# Patient Record
Sex: Female | Born: 1955 | Race: White | Hispanic: No | Smoking: Never smoker
Health system: Southern US, Community
[De-identification: ages and names within clinical notes are randomized; demographics above are authoritative.]

## PROBLEM LIST (undated history)

## (undated) DIAGNOSIS — E119 Type 2 diabetes mellitus without complications: Secondary | ICD-10-CM

## (undated) DIAGNOSIS — I10 Essential (primary) hypertension: Secondary | ICD-10-CM

## (undated) DIAGNOSIS — G629 Polyneuropathy, unspecified: Secondary | ICD-10-CM

## (undated) DIAGNOSIS — T8859XA Other complications of anesthesia, initial encounter: Secondary | ICD-10-CM

## (undated) DIAGNOSIS — M1990 Unspecified osteoarthritis, unspecified site: Secondary | ICD-10-CM

## (undated) HISTORY — PX: INNER EAR SURGERY: SHX679

## (undated) HISTORY — PX: APPENDECTOMY: SHX54

## (undated) HISTORY — PX: CATARACT EXTRACTION W/ INTRAOCULAR LENS IMPLANT: SHX1309

## (undated) HISTORY — PX: TONSILLECTOMY: SUR1361

## (undated) HISTORY — PX: EYE SURGERY: SHX253

---

## 2021-07-25 ENCOUNTER — Encounter (HOSPITAL_BASED_OUTPATIENT_CLINIC_OR_DEPARTMENT_OTHER): Payer: Self-pay | Admitting: Emergency Medicine

## 2021-07-25 ENCOUNTER — Other Ambulatory Visit: Payer: Self-pay

## 2021-07-25 ENCOUNTER — Emergency Department (HOSPITAL_BASED_OUTPATIENT_CLINIC_OR_DEPARTMENT_OTHER): Payer: Medicare Other

## 2021-07-25 ENCOUNTER — Emergency Department (HOSPITAL_BASED_OUTPATIENT_CLINIC_OR_DEPARTMENT_OTHER)
Admission: EM | Admit: 2021-07-25 | Discharge: 2021-07-25 | Disposition: A | Discharge status: Home / Self Care | Payer: Medicare Other | Attending: Emergency Medicine | Admitting: Emergency Medicine

## 2021-07-25 DIAGNOSIS — S42352A Displaced comminuted fracture of shaft of humerus, left arm, initial encounter for closed fracture: Secondary | ICD-10-CM

## 2021-07-25 DIAGNOSIS — W19XXXA Unspecified fall, initial encounter: Secondary | ICD-10-CM

## 2021-07-25 DIAGNOSIS — E119 Type 2 diabetes mellitus without complications: Secondary | ICD-10-CM | POA: Diagnosis not present

## 2021-07-25 DIAGNOSIS — M7989 Other specified soft tissue disorders: Secondary | ICD-10-CM | POA: Insufficient documentation

## 2021-07-25 DIAGNOSIS — W010XXA Fall on same level from slipping, tripping and stumbling without subsequent striking against object, initial encounter: Secondary | ICD-10-CM | POA: Diagnosis not present

## 2021-07-25 DIAGNOSIS — S4992XA Unspecified injury of left shoulder and upper arm, initial encounter: Secondary | ICD-10-CM | POA: Diagnosis present

## 2021-07-25 DIAGNOSIS — I10 Essential (primary) hypertension: Secondary | ICD-10-CM | POA: Diagnosis not present

## 2021-07-25 HISTORY — DX: Type 2 diabetes mellitus without complications: E11.9

## 2021-07-25 HISTORY — DX: Essential (primary) hypertension: I10

## 2021-07-25 MED ORDER — OXYCODONE-ACETAMINOPHEN 5-325 MG PO TABS
1.0000 | ORAL_TABLET | Freq: Once | ORAL | Status: AC
  Administered 2021-07-25: 1 via ORAL
  Filled 2021-07-25: qty 1

## 2021-07-25 MED ORDER — METHOCARBAMOL 500 MG PO TABS: 500.0000 mg | ORAL_TABLET | Freq: Two times a day (BID) | ORAL | 0 refills | Status: DC

## 2021-07-25 MED ORDER — OXYCODONE-ACETAMINOPHEN 5-325 MG PO TABS: 1.0000 | ORAL_TABLET | Freq: Four times a day (QID) | ORAL | 0 refills | Status: DC | PRN

## 2021-07-25 MED ORDER — HYDROMORPHONE HCL 1 MG/ML IJ SOLN
1.0000 mg | Freq: Once | INTRAMUSCULAR | Status: AC
  Administered 2021-07-25: 1 mg via INTRAVENOUS
  Filled 2021-07-25: qty 1

## 2021-07-25 NOTE — Discharge Instructions (Addendum)
You were evaluated emergency department today with left arm pain after a fall.  Your x-ray of your left upper arm showed that you fractured your humerus at approximately the midshaft of the bone.  We have splinted this and given you pain medication here.    I am also prescribing you a course of pain medication to take called Percocet, which is a combination of oxycodone and acetaminophen. You can take this every 6 hours as needed for severe pain. Do not drink alcohol while taking this medication. Do not take additional acetaminophen/tylenol while taking this medication.   I am also prescribing you robaxin which is a muscle relaxer which you can take twice daily to help with muscle spasms.   Normally the treatment of this is immobilization, pain control, and following up with orthopedic provider.  I have attached Dr. Steward Drone contact information who will see you in clinic next week.

## 2021-07-25 NOTE — ED Notes (Signed)
EMT at bedside for splint application.

## 2021-07-25 NOTE — ED Notes (Signed)
ED Provider at bedside. 

## 2021-07-25 NOTE — ED Triage Notes (Signed)
Pt fell this am.  Pt c/o pain in left arm. No head injury, no LOC

## 2021-07-25 NOTE — ED Notes (Signed)
Dr. Pfeiffer at bedside   

## 2021-07-25 NOTE — Progress Notes (Addendum)
I was contacted regarding patient's left humerus fracture.  I have reviewed x-rays.  Per the emergency department physician I had spoken with, the patient's radial nerve is intact. I discussed patient's situation with Dr. Steward Drone. He has recommended that the patient be placed into a sugar tong splint, with subsequent follow-up with him.  He did also ask that the emergency room MD specifically document that the patient has an intact radial nerve, which is what was verbally stated to me. This request was made to Dr. Donnald Garre, whom I spoke with.

## 2021-07-25 NOTE — ED Provider Notes (Signed)
[facility] EMERGENCY DEPARTMENT Provider Note   CSN: [id number] Arrival date & time: 07/25/21  1056     History Chief Complaint  Patient presents with   Marletta Lor    Caitlin Odonnell is a 65 y.o. female presents with left arm pain after a fall at about 10:00 this morning.  Patient states that she was walking outside, believes that she tripped and fell onto her left side.  Since then she has had persistent pain and swelling in her left upper arm.  Believes that she skinned her knee, no pain anywhere else.  No head trauma or loss of consciousness.   Fall      Past Medical History:  Diagnosis Date   Diabetes mellitus without complication (HCC)    Hypertension     There are no problems to display for this patient.   Past Surgical History:  Procedure Laterality Date   CATARACT EXTRACTION W/ INTRAOCULAR LENS IMPLANT       OB History   No obstetric history on file.     History reviewed. No pertinent family history.     Home Medications Prior to Admission medications   Medication Sig Start Date End Date Taking? Authorizing Provider  oxyCODONE-acetaminophen (PERCOCET/ROXICET) 5-325 MG tablet Take 1 tablet by mouth every 6 (six) hours as needed for severe pain. 07/25/21  Yes Aishah Teffeteller T, PA-C    Allergies    Cortisone  Review of Systems   Review of Systems  Musculoskeletal:        Left upper arm pain  Skin:  Negative for wound.  All other systems reviewed and are negative.  Physical Exam Updated Vital Signs BP 138/71 (BP Location: Right Arm)   Pulse 88   Temp 98.1 F (36.7 C) (Oral)   Resp (!) 26   Ht 5\' 3"  (1.6 m)   Wt 136.1 kg   SpO2 93%   BMI 53.14 kg/m   Physical Exam Vitals and nursing note reviewed.  Constitutional:      Appearance: Normal appearance.  HENT:     Head: Normocephalic and atraumatic.  Eyes:     Conjunctiva/sclera: Conjunctivae normal.  Pulmonary:     Effort: Pulmonary effort is normal. No respiratory distress.   Musculoskeletal:     Comments: Arm held in position of comfort. Tenderness to palpation and noticeable swelling of left upper arm at approximately mid humerus.  No tenderness or deformities palpated of the clavicle, left shoulder, or left lower arm.  Full ROM and 5/5 strength of all left digits including thumb. Good grip strength. Pulses normal and sensation intact in bilateral upper extremities.   Skin:    General: Skin is warm and dry.  Neurological:     Mental Status: She is alert.  Psychiatric:        Mood and Affect: Mood normal.        Behavior: Behavior normal.    ED Results / Procedures / Treatments   Labs (all labs ordered are listed, but only abnormal results are displayed) Labs Reviewed - No data to display  EKG None  Radiology DG Humerus Left  Result Date: 07/25/2021 CLINICAL DATA:  Fall this morning.  Left arm pain. EXAM: LEFT HUMERUS - 2 VIEW COMPARISON:  None. FINDINGS: Comminuted, angulated and mildly displaced midshaft humeral fracture. Fracture is spiral in orientation extending from the posterolateral proximal shaft to the lateral mid to lower shaft. Comminuted butterfly fragment is displaced laterally by 1 cm. Primary fracture components are angulated, distal fracture component  angulated anteriorly by approximately 24 degrees. There is surrounding soft tissue swelling. Elbow and glenohumeral joints appear normally aligned. IMPRESSION: 1. Mildly comminuted, displaced and angulated diaphyseal fracture of the left humerus. Electronically Signed   By: Amie Portland M.D.   On: 07/25/2021 11:55    Procedures Procedures   Medications Ordered in ED Medications  oxyCODONE-acetaminophen (PERCOCET/ROXICET) 5-325 MG per tablet 1 tablet (1 tablet Oral Given 07/25/21 1228)  HYDROmorphone (DILAUDID) injection 1 mg (1 mg Intravenous Given 07/25/21 1342)    ED Course  I have reviewed the triage vital signs and the nursing notes.  Pertinent labs & imaging results that were  available during my care of the patient were reviewed by me and considered in my medical decision making (see chart for details).    MDM Rules/Calculators/A&P                           Patient is a 65 year old female who presents with left upper arm pain after a fall this morning.  No head injury or loss of consciousness.  She does have tenderness palpation and swelling of the left upper arm at approximately the mid humerus.  Pulses normal and sensation intact in bilateral upper extremities. Good grip strength. Full ROM and 5/5 strength to all left digits including thumb. No concern for radial nerve injury.   X-ray of left humerus shows closed mildly comminuted, displaced and angulated diaphyseal fracture of left humerus. Consulted Dr. Steward Drone with orthopedic surgery to discuss management, who recommended sugar tong splint and follow up in his clinic. Plan to d/c to home with splint, pain medication, and ortho follow up.   Patient discussed and seen in conjunction with attending physician Dr Donnald Garre who agrees with above plan.   Final Clinical Impression(s) / ED Diagnoses Final diagnoses:  Closed displaced comminuted fracture of shaft of left humerus, initial encounter  Fall, initial encounter    Rx / DC Orders ED Discharge Orders          Ordered    oxyCODONE-acetaminophen (PERCOCET/ROXICET) 5-325 MG tablet  Every 6 hours PRN        07/25/21 1410             Tamiki Kuba T, PA-C 07/25/21 1506    Arby Barrette, MD 07/26/21 [phone]

## 2021-07-27 ENCOUNTER — Other Ambulatory Visit (HOSPITAL_BASED_OUTPATIENT_CLINIC_OR_DEPARTMENT_OTHER): Payer: Self-pay

## 2021-07-27 ENCOUNTER — Other Ambulatory Visit: Payer: Self-pay

## 2021-07-27 ENCOUNTER — Ambulatory Visit (HOSPITAL_BASED_OUTPATIENT_CLINIC_OR_DEPARTMENT_OTHER): Payer: Medicare Other | Admitting: Orthopaedic Surgery

## 2021-07-27 VITALS — BP 139/83 | Ht 62.0 in | Wt 300.0 lb

## 2021-07-27 DIAGNOSIS — S42352A Displaced comminuted fracture of shaft of humerus, left arm, initial encounter for closed fracture: Secondary | ICD-10-CM | POA: Diagnosis not present

## 2021-07-27 MED ORDER — OXYCODONE HCL 5 MG PO TABS
5.0000 mg | ORAL_TABLET | ORAL | 0 refills | Status: DC | PRN
  Filled 2021-07-27: qty 20, 4d supply, fill #0

## 2021-07-27 MED ORDER — METHOCARBAMOL 500 MG PO TABS
500.0000 mg | ORAL_TABLET | Freq: Four times a day (QID) | ORAL | 2 refills | Status: DC
  Filled 2021-07-27 (×2): qty 30, 8d supply, fill #0

## 2021-07-27 NOTE — Progress Notes (Signed)
Chief Complaint: Left arm pain     History of Present Illness:   Pain Score: 5/10 SANE: 0/100  Caitlin Odonnell is a 65 y.o. 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  She states that she felt a break and subsequently proceeded to the emergency room.  She denies any history of shoulder or arm pain.  The arm was functioning normal prior to this.  She was placed in a sugar-tong splint in the emergency room and given a sling.  She denies any numbness or weakness in the left hand.  She does endorse some swelling and pain about the left shoulder.  She is sleeping in her recliner to get comfortable   Surgical History:   None  PMH/PSH/Family History/Social History/Meds/Allergies:    Past Medical History:  Diagnosis Date   Diabetes mellitus without complication (HCC)    Hypertension    Past Surgical History:  Procedure Laterality Date   CATARACT EXTRACTION W/ INTRAOCULAR LENS IMPLANT     Social History   Socioeconomic History   Marital status: Widowed    Spouse name: Not on file   Number of children: Not on file   Years of education: Not on file   Highest education level: Not on file  Occupational History   Not on file  Tobacco Use   Smoking status: Not on file   Smokeless tobacco: Not on file  Substance and Sexual Activity   Alcohol use: Not on file   Drug use: Not on file   Sexual activity: Not on file  Other Topics Concern   Not on file  Social History Narrative   Not on file   Social Determinants of Health   Financial Resource Strain: Not on file  Food Insecurity: Not on file  Transportation Needs: Not on file  Physical Activity: Not on file  Stress: Not on file  Social Connections: Not on file   No family history on file. Allergies  Allergen Reactions   Cortisone Other (See Comments)    Had some numbness in her face.  States it happened after a cortisone injection in her knee, had never had reaction to it prior to this incident.    Current Outpatient Medications  Medication Sig Dispense Refill   methocarbamol (ROBAXIN) 500 MG tablet Take 1 tablet (500 mg total) by mouth 2 (two) times daily. 20 tablet 0   oxyCODONE-acetaminophen (PERCOCET/ROXICET) 5-325 MG tablet Take 1 tablet by mouth every 6 (six) hours as needed for severe pain. 15 tablet 0   No current facility-administered medications for this visit.   DG Humerus Left  Result Date: 07/25/2021 CLINICAL DATA:  Fall this morning.  Left arm pain. EXAM: LEFT HUMERUS - 2 VIEW COMPARISON:  None. FINDINGS: Comminuted, angulated and mildly displaced midshaft humeral fracture. Fracture is spiral in orientation extending from the posterolateral proximal shaft to the lateral mid to lower shaft. Comminuted butterfly fragment is displaced laterally by 1 cm. Primary fracture components are angulated, distal fracture component angulated anteriorly by approximately 24 degrees. There is surrounding soft tissue swelling. Elbow and glenohumeral joints appear normally aligned. IMPRESSION: 1. Mildly comminuted, displaced and angulated diaphyseal fracture of the left humerus. Electronically Signed   By: Amie Portland  M.D.   On: 07/25/2021 11:55    Review of Systems:   A ROS was performed including pertinent positives and negatives as documented in the HPI.  Physical Exam :   Constitutional: NAD and appears stated age Neurological: Alert and oriented Psych: Appropriate affect and cooperative Blood pressure 139/83, height 5\' 2"  (1.575 m), weight 300 lb (136.1 kg).   Comprehensive Musculoskeletal Exam:    Left arm is splinted in a sugar-tong U.  There is swelling about the arm.  She is able to extend at the wrist as well as fire the EPL of the left thumb.  Sensation is intact light touch in all distributions of the left hand.  2+ radial pulse.  Imaging:   Xray (humerus 2  views): There is a midshaft fracture of the humerus with a large butterfly fragment extending proximal into the greater tuberosity   I personally reviewed and interpreted the radiographs.   Assessment:   65 year old female with a left midshaft humeral fracture with proximal extension.  I discussed the possible treatment options including surgery versus close management in the Sarmiento brace.  I discussed that with nonoperative management ultimately this will require a period of time where the bones are still moving and quite uncomfortable.  We discussed that ultimately gravity is needed to assist in conjunction with the Sarmiento brace.  We discussed that with surgery there are risks including rotator cuff pain as well as radial nerve palsy from the distal interlock screw.  Ultimately she would like to try with nonoperative therapy if possible.  I do believe that this is reasonable.  I  Plan :    -I will see her back in 1 week for placement of a Sarmiento brace -Oxycodone as well as Robaxin was sent to the pharmacy to assist her with pain control.    I personally saw and evaluated the patient, and participated in the management and treatment plan.  75, MD Attending Physician, Orthopedic Surgery  This document was dictated using Dragon voice recognition software. A reasonable attempt at proof reading has been made to minimize errors.

## 2021-07-28 ENCOUNTER — Telehealth: Payer: Self-pay | Admitting: Orthopaedic Surgery

## 2021-07-28 NOTE — Telephone Encounter (Signed)
Patient called. Says the splint slide down and is on her elbow. Would like a call [phone]

## 2021-07-30 ENCOUNTER — Ambulatory Visit (HOSPITAL_BASED_OUTPATIENT_CLINIC_OR_DEPARTMENT_OTHER): Payer: Medicare Other | Admitting: Orthopaedic Surgery

## 2021-07-30 ENCOUNTER — Other Ambulatory Visit: Payer: Self-pay

## 2021-07-30 DIAGNOSIS — S42352A Displaced comminuted fracture of shaft of humerus, left arm, initial encounter for closed fracture: Secondary | ICD-10-CM

## 2021-07-30 NOTE — Progress Notes (Signed)
Chief Complaint: Left arm pain     History of Present Illness:   Pain Score: 5/10 SANE: 0/100  07/30/2021: Caitlin Odonnell today for sarmiento brace fitting. Continue to have pain, particularly with clicking/bone shifting.  Caitlin Odonnell is a 65 y.o. 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  She states that she felt a break and subsequently proceeded to the emergency room.  She denies any history of shoulder or arm pain.  The arm was functioning normal prior to this.  She was placed in a sugar-tong splint in the emergency room and given a sling.  She denies any numbness or weakness in the left hand.  She does endorse some swelling and pain about the left shoulder.  She is sleeping in her recliner to get comfortable   Surgical History:   None  PMH/PSH/Family History/Social History/Meds/Allergies:    Past Medical History:  Diagnosis Date   Diabetes mellitus without complication (HCC)    Hypertension    Past Surgical History:  Procedure Laterality Date   CATARACT EXTRACTION W/ INTRAOCULAR LENS IMPLANT     Social History   Socioeconomic History   Marital status: Widowed    Spouse name: Not on file   Number of children: Not on file   Years of education: Not on file   Highest education level: Not on file  Occupational History   Not on file  Tobacco Use   Smoking status: Not on file   Smokeless tobacco: Not on file  Substance and Sexual Activity   Alcohol use: Not on file   Drug use: Not on file   Sexual activity: Not on file  Other Topics Concern   Not on file  Social History Narrative   Not on file   Social Determinants of Health   Financial Resource Strain: Not on file  Food Insecurity: Not on file  Transportation Needs: Not on file  Physical Activity: Not on file  Stress: Not on file  Social Connections: Not on file   No  family history on file. Allergies  Allergen Reactions   Cortisone Other (See Comments)    Had some numbness in her face. States it happened after a cortisone injection in her knee, had never had reaction to it prior to this incident.    Current Outpatient Medications  Medication Sig Dispense Refill   methocarbamol (ROBAXIN) 500 MG tablet Take 1 tablet (500 mg total) by mouth 2 (two) times daily. 20 tablet 0   methocarbamol (ROBAXIN) 500 MG tablet Take 1 tablet (500 mg total) by mouth 4 (four) times daily. 30 tablet 2   oxyCODONE (OXY IR/ROXICODONE) 5 MG immediate release tablet Take 1 tablet (5 mg total) by mouth every 4 (four) hours as needed (severe pain). 20 tablet 0   oxyCODONE-acetaminophen (PERCOCET/ROXICET) 5-325 MG tablet Take 1 tablet by mouth every 6 (six) hours as needed for severe pain. 15 tablet 0   No current facility-administered medications for this visit.   No results found.  Review of Systems:   A ROS was performed including pertinent positives and negatives as documented in the HPI.  Physical Exam :   Constitutional: NAD and appears stated age Neurological: Alert and oriented Psych: Appropriate affect  and cooperative There were no vitals taken for this visit.   Comprehensive Musculoskeletal Exam:    Left arm with bruising.  There is swelling about the arm.  She is able to extend at the wrist as well as fire the EPL of the left thumb.  Sensation is intact light touch in all distributions of the left hand.  2+ radial pulse.  Imaging:     I personally reviewed and interpreted the radiographs.   Assessment:   65 year old female with a left midshaft humeral fracture with proximal extension.  I discussed the possible treatment options including surgery versus close management in the Sarmiento brace.  I discussed that with nonoperative management ultimately this will require a period of time where the bones are still moving and quite uncomfortable.  We discussed that  ultimately gravity is needed to assist in conjunction with the Sarmiento brace.  We discussed that with surgery there are risks including rotator cuff pain as well as radial nerve palsy from the distal interlock screw.  Ultimately she would like to try with nonoperative therapy if possible.  I do believe that this is reasonable.    Plan for sarmiento brace fitting and placement today.  Plan :    -Continue nonoperative treatment -RTC 1 month for followup    I personally saw and evaluated the patient, and participated in the management and treatment plan.  Huel Cote, MD Attending Physician, Orthopedic Surgery  This document was dictated using Dragon voice recognition software. A reasonable attempt at proof reading has been made to minimize errors.

## 2021-08-03 ENCOUNTER — Ambulatory Visit (HOSPITAL_BASED_OUTPATIENT_CLINIC_OR_DEPARTMENT_OTHER): Payer: Medicare Other | Admitting: Orthopaedic Surgery

## 2021-08-19 ENCOUNTER — Telehealth: Payer: Self-pay

## 2021-08-19 NOTE — Telephone Encounter (Signed)
Patient called stating that her left hand has been swollen for 3 weeks and that the swelling is bad.  Stated that she is not able to bend her fingers.  Cb# [phone].  Please advise.  Thank you

## 2021-08-20 ENCOUNTER — Ambulatory Visit (HOSPITAL_BASED_OUTPATIENT_CLINIC_OR_DEPARTMENT_OTHER)
Admission: RE | Admit: 2021-08-20 | Discharge: 2021-08-20 | Disposition: A | Discharge status: Home / Self Care | Payer: Medicare Other | Source: Ambulatory Visit | Attending: Orthopaedic Surgery | Admitting: Orthopaedic Surgery

## 2021-08-20 ENCOUNTER — Other Ambulatory Visit (HOSPITAL_BASED_OUTPATIENT_CLINIC_OR_DEPARTMENT_OTHER): Payer: Self-pay | Admitting: Orthopaedic Surgery

## 2021-08-20 ENCOUNTER — Other Ambulatory Visit: Payer: Self-pay

## 2021-08-20 ENCOUNTER — Ambulatory Visit (INDEPENDENT_AMBULATORY_CARE_PROVIDER_SITE_OTHER): Payer: Medicare Other | Admitting: Orthopaedic Surgery

## 2021-08-20 DIAGNOSIS — S42352A Displaced comminuted fracture of shaft of humerus, left arm, initial encounter for closed fracture: Secondary | ICD-10-CM

## 2021-08-20 NOTE — Progress Notes (Signed)
Chief Complaint: Left arm pain     History of Present Illness:   08/20/2021: Caitlin Odonnell presents today for follow-up of her left humeral fracture.  She specifically is following up that she has had significant swelling in the hand that is not going down.  She is attempting to utilize a glove which is not helping completely.  She still feels as though the humerus bone is rubbing on itself.  She has been able to return back to work  Caitlin Odonnell is a 65 y.o. 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  She states that she felt a break and subsequently proceeded to the emergency room.  She denies any history of shoulder or arm pain.  The arm was functioning normal prior to this.  She was placed in a sugar-tong splint in the emergency room and given a sling.  She denies any numbness or weakness in the left hand.  She does endorse some swelling and pain about the left shoulder.  She is sleeping in her recliner to get comfortable   Surgical History:   None  PMH/PSH/Family History/Social History/Meds/Allergies:    Past Medical History:  Diagnosis Date   Diabetes mellitus without complication (HCC)    Hypertension    Past Surgical History:  Procedure Laterality Date   CATARACT EXTRACTION W/ INTRAOCULAR LENS IMPLANT     Social History   Socioeconomic History   Marital status: Widowed    Spouse name: Not on file   Number of children: Not on file   Years of education: Not on file   Highest education level: Not on file  Occupational History   Not on file  Tobacco Use   Smoking status: Not on file   Smokeless tobacco: Not on file  Substance and Sexual Activity   Alcohol use: Not on file   Drug use: Not on file   Sexual activity: Not on file  Other Topics Concern   Not on file  Social History Narrative   Not on file   Social Determinants of Health    Financial Resource Strain: Not on file  Food Insecurity: Not on file  Transportation Needs: Not on file  Physical Activity: Not on file  Stress: Not on file  Social Connections: Not on file   No family history on file. Allergies  Allergen Reactions   Cortisone Other (See Comments)    Had some numbness in her face. States it happened after a cortisone injection in her knee, had never had reaction to it prior to this incident.    Current Outpatient Medications  Medication Sig Dispense Refill   methocarbamol (ROBAXIN) 500 MG tablet Take 1 tablet (500 mg total) by mouth 2 (two) times daily. 20 tablet 0   methocarbamol (ROBAXIN) 500 MG tablet Take 1 tablet (500 mg total) by mouth 4 (four) times daily. 30 tablet 2   oxyCODONE (OXY IR/ROXICODONE) 5 MG immediate release tablet Take 1 tablet (5 mg total) by mouth every 4 (four) hours as needed (severe pain). 20 tablet 0   oxyCODONE-acetaminophen (PERCOCET/ROXICET) 5-325 MG tablet Take 1 tablet by mouth every 6 (six) hours as needed for severe pain. 15 tablet 0   No current facility-administered medications for this  visit.   No results found.  Review of Systems:   A ROS was performed including pertinent positives and negatives as documented in the HPI.  Physical Exam :   Constitutional: NAD and appears stated age Neurological: Alert and oriented Psych: Appropriate affect and cooperative There were no vitals taken for this visit.   Comprehensive Musculoskeletal Exam:    There is some motion about the fracture site with abduction of the left arm.  There is swelling about the arm.  She is able to extend at the wrist as well as fire the EPL of the left thumb.  Sensation is intact light touch in all distributions of the left hand.  2+ radial pulse.  Imaging:    X-rays 2 views left humerus: There is increased callus formation about the fracture with improved alignment status post Sarmiento bracing  I personally reviewed and interpreted  the radiographs.   Assessment:   65 year old female with a left midshaft humeral fracture with proximal extension.  Overall she is doing very well.  I described that hand swelling is extremely common following humeral fracture as it is very difficult to elevate the arm or put the arm in a position in which the swelling can improve.  I agree with glove usage.  I have also recommended that she squeeze and pump the fifth is much as possible to get out the fluid.  She is working on elbow range of motion as tolerated.  This is somewhat difficult that she still has motion at her fracture site.  I described that she is well within the timeframe of motion and that her x-rays are consistent with ongoing healing of the humerus.  I will see her back in 4 weeks for repeat x-rays.  Plan :    -Continue nonoperative treatment -RTC 1 month for followup    I personally saw and evaluated the patient, and participated in the management and treatment plan.  Huel Cote, MD Attending Physician, Orthopedic Surgery  This document was dictated using Dragon voice recognition software. A reasonable attempt at proof reading has been made to minimize errors.

## 2021-08-20 NOTE — Progress Notes (Unsigned)
Dg l 

## 2021-08-21 ENCOUNTER — Ambulatory Visit (HOSPITAL_BASED_OUTPATIENT_CLINIC_OR_DEPARTMENT_OTHER): Payer: Medicare Other | Admitting: Orthopaedic Surgery

## 2021-08-27 ENCOUNTER — Ambulatory Visit (HOSPITAL_BASED_OUTPATIENT_CLINIC_OR_DEPARTMENT_OTHER): Payer: Medicare Other | Admitting: Orthopaedic Surgery

## 2021-09-21 ENCOUNTER — Other Ambulatory Visit (HOSPITAL_BASED_OUTPATIENT_CLINIC_OR_DEPARTMENT_OTHER): Payer: Self-pay | Admitting: Orthopaedic Surgery

## 2021-09-21 DIAGNOSIS — S42352A Displaced comminuted fracture of shaft of humerus, left arm, initial encounter for closed fracture: Secondary | ICD-10-CM

## 2021-09-22 ENCOUNTER — Ambulatory Visit (HOSPITAL_BASED_OUTPATIENT_CLINIC_OR_DEPARTMENT_OTHER): Payer: Medicare Other | Admitting: Orthopaedic Surgery

## 2021-09-22 ENCOUNTER — Other Ambulatory Visit: Payer: Self-pay

## 2021-09-22 ENCOUNTER — Ambulatory Visit (HOSPITAL_BASED_OUTPATIENT_CLINIC_OR_DEPARTMENT_OTHER)
Admission: RE | Admit: 2021-09-22 | Discharge: 2021-09-22 | Disposition: A | Discharge status: Home / Self Care | Payer: Medicare Other | Source: Ambulatory Visit | Attending: Orthopaedic Surgery | Admitting: Orthopaedic Surgery

## 2021-09-22 DIAGNOSIS — S42352A Displaced comminuted fracture of shaft of humerus, left arm, initial encounter for closed fracture: Secondary | ICD-10-CM

## 2021-09-22 NOTE — Progress Notes (Signed)
Chief Complaint: Left arm pain     History of Present Illness:   09/22/2021: Presents today for follow-up of her known left humerus fracture.  This is approximately 6 weeks out.  She continues to have some motion at the fracture site although overall this is much decreased.  She is able to sleep now pain is overall improved  Caitlin Odonnell is a 65 y.o. 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  She states that she felt a break and subsequently proceeded to the emergency room.  She denies any history of shoulder or arm pain.  The arm was functioning normal prior to this.  She was placed in a sugar-tong splint in the emergency room and given a sling.  She denies any numbness or weakness in the left hand.  She does endorse some swelling and pain about the left shoulder.  She is sleeping in her recliner to get comfortable   Surgical History:   None  PMH/PSH/Family History/Social History/Meds/Allergies:    Past Medical History:  Diagnosis Date   Diabetes mellitus without complication (HCC)    Hypertension    Past Surgical History:  Procedure Laterality Date   CATARACT EXTRACTION W/ INTRAOCULAR LENS IMPLANT     Social History   Socioeconomic History   Marital status: Widowed    Spouse name: Not on file   Number of children: Not on file   Years of education: Not on file   Highest education level: Not on file  Occupational History   Not on file  Tobacco Use   Smoking status: Not on file   Smokeless tobacco: Not on file  Substance and Sexual Activity   Alcohol use: Not on file   Drug use: Not on file   Sexual activity: Not on file  Other Topics Concern   Not on file  Social History Narrative   Not on file   Social Determinants of Health   Financial Resource Strain: Not on file  Food Insecurity: Not on file  Transportation Needs: Not on  file  Physical Activity: Not on file  Stress: Not on file  Social Connections: Not on file   No family history on file. Allergies  Allergen Reactions   Cortisone Other (See Comments)    Had some numbness in her face. States it happened after a cortisone injection in her knee, had never had reaction to it prior to this incident.    Current Outpatient Medications  Medication Sig Dispense Refill   methocarbamol (ROBAXIN) 500 MG tablet Take 1 tablet (500 mg total) by mouth 2 (two) times daily. 20 tablet 0   methocarbamol (ROBAXIN) 500 MG tablet Take 1 tablet (500 mg total) by mouth 4 (four) times daily. 30 tablet 2   oxyCODONE (OXY IR/ROXICODONE) 5 MG immediate release tablet Take 1 tablet (5 mg total) by mouth every 4 (four) hours as needed (severe pain). 20 tablet 0   oxyCODONE-acetaminophen (PERCOCET/ROXICET) 5-325 MG tablet Take 1 tablet by mouth every 6 (six) hours as needed for severe pain. 15 tablet 0   No current facility-administered medications for this visit.   No results found.  Review of Systems:   A ROS was performed including pertinent positives and negatives  as documented in the HPI.  Physical Exam :   Constitutional: NAD and appears stated age Neurological: Alert and oriented Psych: Appropriate affect and cooperative There were no vitals taken for this visit.   Comprehensive Musculoskeletal Exam:    There is some motion about the fracture site with abduction of the left arm.  There is swelling about the arm.  She is able to extend at the wrist as well as fire the EPL of the left thumb.  Sensation is intact light touch in all distributions of the left hand.  2+ radial pulse.  Imaging:    X-rays 2 views left humerus: There is callus formation about the left humerus.  There is about 20 degrees of varus angulation.  She does not notice this in the clinical sense.  I personally reviewed and interpreted the radiographs.   Assessment:   65 year old female with a left  midshaft humeral fracture with proximal extension.  Overall x-rays continue to show continued callus formation.  I would like her to begin physical therapy for passive range of motion of the shoulder.  I have advised that I would like her to be in her Sarmiento until she no longer feels motion at the fracture site.  At that time she may wean out of it and gently begin active range of motion.  Physical therapy prescription provided from the [facility]  Plan :    -RTC 1 month for followup    I personally saw and evaluated the patient, and participated in the management and treatment plan.  Huel Cote, MD Attending Physician, Orthopedic Surgery  This document was dictated using Dragon voice recognition software. A reasonable attempt at proof reading has been made to minimize errors.

## 2021-09-29 ENCOUNTER — Ambulatory Visit: Payer: Medicare Other | Attending: Orthopaedic Surgery | Admitting: Physical Therapy

## 2021-09-29 ENCOUNTER — Encounter: Payer: Self-pay | Admitting: Physical Therapy

## 2021-09-29 ENCOUNTER — Other Ambulatory Visit: Payer: Self-pay

## 2021-09-29 DIAGNOSIS — M25612 Stiffness of left shoulder, not elsewhere classified: Secondary | ICD-10-CM | POA: Diagnosis present

## 2021-09-29 DIAGNOSIS — M79622 Pain in left upper arm: Secondary | ICD-10-CM | POA: Insufficient documentation

## 2021-09-29 DIAGNOSIS — R293 Abnormal posture: Secondary | ICD-10-CM | POA: Insufficient documentation

## 2021-09-29 DIAGNOSIS — M6281 Muscle weakness (generalized): Secondary | ICD-10-CM | POA: Diagnosis present

## 2021-09-29 DIAGNOSIS — S42352A Displaced comminuted fracture of shaft of humerus, left arm, initial encounter for closed fracture: Secondary | ICD-10-CM | POA: Insufficient documentation

## 2021-09-29 NOTE — Patient Instructions (Signed)
   Access Code: [email or web address] URL: [email or web address] Date: 09/29/2021 Prepared by: Glenetta Hew  Exercises Seated Scapular Retraction - 2-3 x daily - 7 x weekly - 2 sets - 10 reps - 5 sec hold Shoulder Rolls in Sitting - 2-3 x daily - 7 x weekly - 2 sets - 10 reps Wrist Extension AROM - 2-3 x daily - 7 x weekly - 2 sets - 10 reps - 3 sec hold Seated Wrist Flexion AROM - 2-3 x daily - 7 x weekly - 2 sets - 10 reps - 3 sec hold Seated Forearm Pronation Supination AROM - 2-3 x daily - 7 x weekly - 2 sets - 10 reps - 3 sec hold

## 2021-09-29 NOTE — Therapy (Signed)
[facility] [address]  [address] [place], [place], [address] Phone: [phone]   Fax:  [phone]  Physical Therapy Evaluation  Patient Details  Name: Caitlin Odonnell MRN: [id number] Date of Birth: [birth year] Referring Provider (PT): Huel Cote, MD   Encounter Date: 09/29/2021   PT End of Session - 09/29/21 1407     Visit Number 1    Number of Visits 16    Date for PT Re-Evaluation 11/24/21    Authorization Type UHC Medicare    PT Start Time 1407    PT Stop Time 1457    PT Time Calculation (min) 50 min    Activity Tolerance Patient tolerated treatment well    Behavior During Therapy [facility] for tasks assessed/performed             Past Medical History:  Diagnosis Date   Diabetes mellitus without complication (HCC)    Hypertension     Past Surgical History:  Procedure Laterality Date   CATARACT EXTRACTION W/ INTRAOCULAR LENS IMPLANT      There were no vitals filed for this visit.    Subjective Assessment - 09/29/21 1411     Subjective Pt reports she tripped at fell at work on 07/25/21 (~9 wks ago) and tried to catch herself with her L arm fracturing the humerus. Pt reports more pain now than initially after fracture with sensation that the bone is rubbing together and popping. She states she is still in the brace issued by the MD for another 4 weeks.    Pertinent History Closed displaced comminuted fracture of shaft of left humerus 07/25/21    Limitations House hold activities    Diagnostic tests 09/22/21 - L humerus x-ray: Healing midshaft humeral fracture in unchanged alignment. Interval peripheral callus formation.    Currently in Pain? Yes    Pain Score 5    4-5/10   Pain Location Arm    Pain Orientation Left;Upper    Pain Descriptors / Indicators Aching   "rubbing"   Pain Type Acute pain    Pain Radiating Towards intermittent pain down into L hand/fingers    Pain Onset More than a month ago   07/25/21    Pain Frequency Intermittent    Aggravating Factors  moving around, UB dressing    Pain Relieving Factors ibuprofen                [facility] PT Assessment - 09/29/21 1407       Assessment   Medical Diagnosis L midshaft humerus fracture    Referring Provider (PT) Huel Cote, MD    Onset Date/Surgical Date 07/25/21    Hand Dominance Right    Next MD Visit 10/19/21    Prior Therapy none      Precautions   Precautions Shoulder    Type of Shoulder Precautions Sarmiento brace & no AROM x 4 more weeks    Shoulder Interventions Shoulder sling/immobilizer      Restrictions   Weight Bearing Restrictions Yes    LUE Weight Bearing Non weight bearing      Balance Screen   Has the patient fallen in the past 6 months Yes    How many times? 1    Has the patient had a decrease in activity level because of a fear of falling?  Yes    Is the patient reluctant to leave their home because of a fear of falling?  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       [facility] Adult PT Treatment/Exercise - 09/29/21 1407       Exercises   Exercises Shoulder      Elbow Exercises   Forearm Supination Left;10 reps;AROM;Supine    Forearm Supination Limitations PT supporting elbow    Forearm Pronation Left;10 reps;AROM;Supine    Forearm Pronation Limitations PT supporting elbow      Wrist Exercises   Wrist Flexion Left;10 reps;AROM;Supine    Wrist Flexion Limitations PT supporting elbow & forearm    Wrist Extension Left;10 reps;AROM;Supine    Wrist Extension Limitations PT supporting elbow & forearm      Manual Therapy   Manual Therapy Passive ROM    Passive ROM L shoulder flexion & abduction PROM                     PT Education - 09/29/21 1457     Education Details PT eval findings, anticipated POC & initial HEP - Access Code: [email or web address]    Person(s) Educated Patient    Methods Explanation;Demonstration;Verbal cues;Tactile cues;Handout    Comprehension Verbalized understanding;Verbal cues required;Tactile cues required;Returned demonstration;Need further instruction              PT Short Term Goals - 09/29/21 1457       PT SHORT TERM GOAL #1   Title Patient will be independent with initial HEP    Status New    Target Date 10/20/21               PT Long Term Goals - 09/29/21 1457       PT LONG TERM GOAL #1   Title Patient will be independent with ongoing/advanced HEP for self-management at home in order to build upon functional gains in therapy    Status New    Target Date 11/24/21      PT LONG TERM GOAL #2   Title Improve posture and alignment with patient to demonstrate improved upright posture with posterior shoulder girdle engaged    Status New    Target Date 11/24/21      PT LONG TERM GOAL #3   Title Decrease pain in the L proximal UE by 50% allowing patient to use L UE for functional activities    Status  New    Target Date 11/24/21      PT LONG TERM GOAL #4   Title Patient to improve  L shoulder AROM to [facility] without pain provocation    Status New    Target Date 11/24/21      PT LONG TERM GOAL #5   Title Patient to report ability to perform ADLs, household, and work-related tasks without limitation due to L UE/shoulder pain, LOM or weakness    Status New    Target Date 11/24/21                    Plan - 09/29/21 1457     Clinical Impression Statement Andrey is a 65 y/o female who presents to OP PT ~9 weeks s/p closed displaced comminuted fracture of midshaft of left humerus on 07/25/21. Recent x-rays revealed incomplete healing with interval peripheral callus formation. Per pt, she is to remain in the Sarmiento brace for another 4 weeks. She still notes painful "popping" sensation as if the bone is still shifting when she moves her arm around. Current deficits include L upper arm pain with intermittent radicular pain into L hand and fingers, postural abnormalities, severely limited L shoulder PROM along with decreased elbow, wrist and hand AROM, decreased L shoulder strength and limited functional use of L arm. Korina will benefit from skilled PT to address above deficits, improve posture and restore pain-free functional ROM and strength in L shoulder to allow her to resume normal daily activities without pain interference. Initial focus of PT will be PROM of shoulder with AROM for distal L UE and neck until f/u with MD on 10/19/21.    Personal Factors and Comorbidities Comorbidity 2;Past/Current Experience;Time since onset of injury/illness/exacerbation;Fitness    Comorbidities DM, HTN    Examination-Activity Limitations Bathing;Bed Mobility;Caring for Others;Carry;Dressing;Hygiene/Grooming;Lift;Reach Overhead;Sleep    Examination-Participation Restrictions Cleaning;Community Activity;Laundry;Meal Prep;Occupation;Shop;Yard Work    Conservation officer, historic buildings Stable/Uncomplicated    Clinical Decision Making Low    Rehab Potential Good    PT Frequency 2x / week    1-2x/wk - pt wishing to start 1x/wk while only PROM due to copay   PT Duration 8 weeks    PT Treatment/Interventions ADLs/Self Care Home Management;Cryotherapy;Electrical Stimulation;Iontophoresis 4mg /ml Dexamethasone;Moist Heat;Therapeutic exercise;Therapeutic activities;Neuromuscular re-education;Patient/family education;Manual techniques;Passive range of motion;Vasopneumatic Device;Joint Manipulations;Dry needling;Taping    PT Next Visit Plan Review initial HEP; L shoulder PROM; initiate pendulum exercises - update HEP as appropriate; modalities PRN for pain    PT Home Exercise Plan Access Code: (11/29)    Consulted and Agree with Plan of Care Patient             Patient will benefit from skilled therapeutic intervention in order to improve the following deficits and impairments:  Decreased activity tolerance, Decreased knowledge of precautions, Decreased mobility, Decreased range of motion, Decreased strength, Increased edema, Impaired perceived functional ability, Impaired UE functional use, Improper body mechanics, Postural dysfunction, Pain  Visit Diagnosis: Pain of left upper arm  Stiffness of left shoulder, not elsewhere classified  Muscle weakness (generalized)  Abnormal posture     Problem List There are no problems to display for this patient.   01-21-1976, PT 09/29/2021, 3:36 PM  [facility] [address]  [address] [place], [place], [place] Phone: [phone]   Fax:  [phone]  Name: Zoi Devine MRN: Pryor Curia Date of Birth: 1956-01-16

## 2021-10-07 ENCOUNTER — Encounter: Payer: Self-pay | Admitting: Physical Therapy

## 2021-10-07 ENCOUNTER — Ambulatory Visit: Payer: Medicare Other | Attending: Orthopaedic Surgery | Admitting: Physical Therapy

## 2021-10-07 ENCOUNTER — Other Ambulatory Visit: Payer: Self-pay

## 2021-10-07 DIAGNOSIS — M79622 Pain in left upper arm: Secondary | ICD-10-CM | POA: Insufficient documentation

## 2021-10-07 DIAGNOSIS — R293 Abnormal posture: Secondary | ICD-10-CM | POA: Diagnosis present

## 2021-10-07 DIAGNOSIS — M25612 Stiffness of left shoulder, not elsewhere classified: Secondary | ICD-10-CM | POA: Diagnosis present

## 2021-10-07 DIAGNOSIS — M6281 Muscle weakness (generalized): Secondary | ICD-10-CM | POA: Insufficient documentation

## 2021-10-07 NOTE — Therapy (Signed)
[facility] [address]  [address] [place], [place], [address] Phone: [phone]   Fax:  [phone]  Physical Therapy Treatment  Patient Details  Name: Caitlin Odonnell MRN: [id number] Date of Birth: [birth year] Referring Provider (PT): Huel Cote, MD   Encounter Date: 10/07/2021   PT End of Session - 10/07/21 0846     Visit Number 2    Number of Visits 16    Date for PT Re-Evaluation 11/24/21    Authorization Type UHC Medicare    Progress Note Due on Visit 10    PT Start Time [phone]    PT Stop Time 0928    PT Time Calculation (min) 42 min    Activity Tolerance Patient tolerated treatment well    Behavior During Therapy [facility] for tasks assessed/performed             Past Medical History:  Diagnosis Date   Diabetes mellitus without complication (HCC)    Hypertension     Past Surgical History:  Procedure Laterality Date   CATARACT EXTRACTION W/ INTRAOCULAR LENS IMPLANT      There were no vitals filed for this visit.   Subjective Assessment - 10/07/21 0847     Subjective the back of my arm is hurting. Shoulder popping whenever I move it.    Pertinent History Closed displaced comminuted fracture of shaft of left humerus 07/25/21    Diagnostic tests 09/22/21 - L humerus x-ray: Healing midshaft humeral fracture in unchanged alignment. Interval peripheral callus formation.    Currently in Pain? Yes    Pain Score 5     Pain Location Arm    Pain Orientation Left    Pain Descriptors / Indicators Aching                [facility] PT Assessment - 10/07/21 0001       PROM   Left Shoulder Flexion 133 Degrees    Left Shoulder ABduction 110 Degrees    Left Shoulder Internal Rotation 68 Degrees    Left Shoulder External Rotation 50 Degrees                           [facility] Adult PT Treatment/Exercise - 10/07/21 0001       Elbow Exercises   Forearm Supination Left;10 reps;AROM;Seated    Forearm  Pronation Left;10 reps;AROM;Seated    Forearm Pronation Limitations passive stretch into pronation x 30 sec after AROM    Other elbow exercises elbow supported on pillow on table for elbow and wrist exercises      Shoulder Exercises: Seated   Elevation 12 reps    Retraction 10 reps      Shoulder Exercises: Standing   Other Standing Exercises pendulum x 30 sec      Wrist Exercises   Wrist Flexion Left;10 reps;AROM;Seated    Wrist Extension Left;10 reps;Seated                     PT Education - 10/07/21 0931     Education Details Pendulum demonstrated and returned demo by pt    Person(s) Educated Patient    Methods Explanation;Demonstration;Verbal cues    Comprehension Verbalized understanding;Returned demonstration              PT Short Term Goals - 09/29/21 1457       PT SHORT TERM GOAL #1   Title Patient will be independent with initial HEP  Status New    Target Date 10/20/21               PT Long Term Goals - 09/29/21 1457       PT LONG TERM GOAL #1   Title Patient will be independent with ongoing/advanced HEP for self-management at home in order to build upon functional gains in therapy    Status New    Target Date 11/24/21      PT LONG TERM GOAL #2   Title Improve posture and alignment with patient to demonstrate improved upright posture with posterior shoulder girdle engaged    Status New    Target Date 11/24/21      PT LONG TERM GOAL #3   Title Decrease pain in the L proximal UE by 50% allowing patient to use L UE for functional activities    Status New    Target Date 11/24/21      PT LONG TERM GOAL #4   Title Patient to improve L shoulder AROM to [facility] without pain provocation    Status New    Target Date 11/24/21      PT LONG TERM GOAL #5   Title Patient to report ability to perform ADLs, household, and work-related tasks without limitation due to L UE/shoulder pain, LOM or weakness    Status New    Target Date 11/24/21                    Plan - 10/07/21 0928     Clinical Impression Statement Good progress with shoulder PROM. Reports some pain in ant shoulder with flexion. Patient compliant with HEP for wrist and forearm ROM.    PT Frequency 2x / week    PT Duration 8 weeks    PT Treatment/Interventions ADLs/Self Care Home Management;Cryotherapy;Electrical Stimulation;Iontophoresis 4mg /ml Dexamethasone;Moist Heat;Therapeutic exercise;Therapeutic activities;Neuromuscular re-education;Patient/family education;Manual techniques;Passive range of motion;Vasopneumatic Device;Joint Manipulations;Dry needling;Taping    PT Next Visit Plan L shoulder PROM; initiate pendulum exercises - update HEP as appropriate; modalities PRN for pain    PT Home Exercise Plan Access Code: (11/29)    Consulted and Agree with Plan of Care Patient             Patient will benefit from skilled therapeutic intervention in order to improve the following deficits and impairments:  Decreased activity tolerance, Decreased knowledge of precautions, Decreased mobility, Decreased range of motion, Decreased strength, Increased edema, Impaired perceived functional ability, Impaired UE functional use, Improper body mechanics, Postural dysfunction, Pain  Visit Diagnosis: Pain of left upper arm  Stiffness of left shoulder, not elsewhere classified  Muscle weakness (generalized)  Abnormal posture     Problem List There are no problems to display for this patient.   01-21-1976, PT 10/07/2021, 9:38 AM  [facility] [address]  [address] [place], [place], [place] Phone: [phone]   Fax:  [phone]  Name: Caitlin Odonnell MRN: Pryor Curia Date of Birth: 1956-05-12

## 2021-10-12 ENCOUNTER — Ambulatory Visit: Payer: Medicare Other | Admitting: Physical Therapy

## 2021-10-12 ENCOUNTER — Encounter: Payer: Self-pay | Admitting: Physical Therapy

## 2021-10-12 ENCOUNTER — Other Ambulatory Visit: Payer: Self-pay

## 2021-10-12 DIAGNOSIS — R293 Abnormal posture: Secondary | ICD-10-CM

## 2021-10-12 DIAGNOSIS — M79622 Pain in left upper arm: Secondary | ICD-10-CM

## 2021-10-12 DIAGNOSIS — M6281 Muscle weakness (generalized): Secondary | ICD-10-CM

## 2021-10-12 DIAGNOSIS — M25612 Stiffness of left shoulder, not elsewhere classified: Secondary | ICD-10-CM

## 2021-10-12 NOTE — Therapy (Signed)
[facility] [address]  [place], [place], [address] Phone: [phone]   Fax:  [phone]  Physical Therapy Treatment / Progress Note  Patient Details  Name: Caitlin Odonnell MRN: [id number] Date of Birth: Apr 07, 1956 Referring Provider (PT): Vanetta Mulders, MD  Progress Note  Reporting Period 09/29/2021 to 10/12/2021  See note below for Objective Data and Assessment of Progress/Goals.     Encounter Date: 10/12/2021   PT End of Session - 10/12/21 0930     Visit Number 3    Number of Visits 16    Date for PT Re-Evaluation 11/24/21    Authorization Type [employer] Medicare    Progress Note Due on Visit 10    PT Start Time 0930    PT Stop Time 1011    PT Time Calculation (min) 41 min    Activity Tolerance Patient tolerated treatment well    Behavior During Therapy WFL for tasks assessed/performed             Past Medical History:  Diagnosis Date   Diabetes mellitus without complication ([place])    Hypertension     Past Surgical History:  Procedure Laterality Date   CATARACT EXTRACTION W/ INTRAOCULAR LENS IMPLANT      There were no vitals filed for this visit.   Subjective Assessment - 10/12/21 0935     Subjective Pt reports increased pain up to 7/10 yesterday - may have tried to do too much. Shoulder popping most of the time when I move it.    Pertinent History Closed displaced comminuted fracture of shaft of left humerus 07/25/21    Diagnostic tests 09/22/21 - L humerus x-ray: Healing midshaft humeral fracture in unchanged alignment. Interval peripheral callus formation.    Currently in Pain? No/denies    Pain Score 0-No pain                [facility] PT Assessment - 10/12/21 0930       Assessment   Medical Diagnosis L midshaft humerus fracture    Referring Provider (PT) Vanetta Mulders, MD    Onset Date/Surgical Date 07/25/21    Hand Dominance Right    Next MD Visit 10/19/21      PROM   Left  Shoulder Flexion 138 Degrees    Left Shoulder ABduction 117 Degrees    Left Shoulder Internal Rotation 77 Degrees    Left Shoulder External Rotation 62 Degrees                           [facility] Adult PT Treatment/Exercise - 10/12/21 0930       Exercises   Exercises Shoulder      Elbow Exercises   Forearm Supination Left;10 reps;AROM;Seated    Forearm Pronation Left;10 reps;AROM;Seated    Other elbow exercises elbow supported on pillow on lap for elbow and wrist exercises      Shoulder Exercises: Supine   External Rotation Left;15 reps;PROM   2 sets   Internal Rotation Left;15 reps;PROM   2 sets   Flexion Left;15 reps;PROM   2 sets   ABduction Left;15 reps;PROM   2 sets   ABduction Limitations in scapular plane      Shoulder Exercises: Seated   Elevation Both;15 reps;AROM    Retraction Both;15 reps    Other Seated Exercises Backward shoulder rolls x 10      Shoulder Exercises: ROM/Strengthening   Pendulum L shoulder flexion/extension &  horiz ABD/ADD x 1 min each      Wrist Exercises   Wrist Flexion Left;10 reps;AROM;Seated    Wrist Flexion Limitations arm resting on pillow in lap    Wrist Extension Left;10 reps;AROM;Seated    Wrist Extension Limitations arm resting on pillow in lap      Manual Therapy   Manual Therapy Passive ROM;Joint mobilization    Joint Mobilization grade I-II l shoulder oscillations & CW/CCW circles    Passive ROM L shoulder - all planes to tolerance                       PT Short Term Goals - 10/12/21 0937       PT SHORT TERM GOAL #1   Title Patient will be independent with initial HEP    Status Achieved   10/12/21              PT Long Term Goals - 10/12/21 0937       PT LONG TERM GOAL #1   Title Patient will be independent with ongoing/advanced HEP for self-management at home in order to build upon functional gains in therapy    Status On-going    Target Date 11/24/21      PT LONG TERM GOAL #2    Title Improve posture and alignment with patient to demonstrate improved upright posture with posterior shoulder girdle engaged    Status On-going    Target Date 11/24/21      PT LONG TERM GOAL #3   Title Decrease pain in the L proximal UE by 50% allowing patient to use L UE for functional activities    Status On-going    Target Date 11/24/21      PT LONG TERM GOAL #4   Title Patient to improve L shoulder AROM to [facility] without pain provocation    Status On-going    Target Date 11/24/21      PT LONG TERM GOAL #5   Title Patient to report ability to perform ADLs, household, and work-related tasks without limitation due to L UE/shoulder pain, LOM or weakness    Status On-going    Target Date 11/24/21                   Plan - 10/12/21 1011     Clinical Impression Statement Caitlin Odonnell reports increased pain yesterday after trying to wrap Christmas presents but denies pain today. She still notes popping of the arm with some motions/activities but not noted as much during therapy today. L shoulder PROM continues to progress (refer to above PROM measurements), although still limited along with limitations in elbow, forearm and wrist/hand ROM. She denies any issues with HEP for distal UE ROM and pendulum exercises - STG met. Will await MD guidance for activity progression.    Rehab Potential Good    PT Frequency 2x / week    PT Duration 8 weeks    PT Treatment/Interventions ADLs/Self Care Home Management;Cryotherapy;Electrical Stimulation;Iontophoresis 76m/ml Dexamethasone;Moist Heat;Therapeutic exercise;Therapeutic activities;Neuromuscular re-education;Patient/family education;Manual techniques;Passive range of motion;Vasopneumatic Device;Joint Manipulations;Dry needling;Taping    PT Next Visit Plan L shoulder PROM with progression per MD as of f/u visit on 10/19/21; modalities PRN for pain    PT Home Exercise Plan Access Code: [email or web address](11/29)    Consulted and Agree with Plan of Care Patient              Patient will benefit from skilled therapeutic intervention in order to improve the following  deficits and impairments:  Decreased activity tolerance, Decreased knowledge of precautions, Decreased mobility, Decreased range of motion, Decreased strength, Increased edema, Impaired perceived functional ability, Impaired UE functional use, Improper body mechanics, Postural dysfunction, Pain  Visit Diagnosis: Pain of left upper arm  Stiffness of left shoulder, not elsewhere classified  Muscle weakness (generalized)  Abnormal posture     Problem List There are no problems to display for this patient.   Percival Spanish, PT 10/12/2021, 6:27 PM  [facility] [address]  [place], [place], [address] Phone: [phone]   Fax:  [phone]  Name: Caitlin Odonnell MRN: [id number] Date of Birth: Oct 16, 1956

## 2021-10-19 ENCOUNTER — Ambulatory Visit (HOSPITAL_BASED_OUTPATIENT_CLINIC_OR_DEPARTMENT_OTHER): Payer: Medicare Other | Admitting: Orthopaedic Surgery

## 2021-10-19 ENCOUNTER — Other Ambulatory Visit: Payer: Self-pay

## 2021-10-19 ENCOUNTER — Ambulatory Visit (HOSPITAL_BASED_OUTPATIENT_CLINIC_OR_DEPARTMENT_OTHER)
Admission: RE | Admit: 2021-10-19 | Discharge: 2021-10-19 | Disposition: A | Discharge status: Home / Self Care | Payer: Medicare Other | Source: Ambulatory Visit | Attending: Orthopaedic Surgery | Admitting: Orthopaedic Surgery

## 2021-10-19 ENCOUNTER — Other Ambulatory Visit (HOSPITAL_BASED_OUTPATIENT_CLINIC_OR_DEPARTMENT_OTHER): Payer: Self-pay | Admitting: Orthopaedic Surgery

## 2021-10-19 DIAGNOSIS — S42352A Displaced comminuted fracture of shaft of humerus, left arm, initial encounter for closed fracture: Secondary | ICD-10-CM

## 2021-10-19 NOTE — Progress Notes (Signed)
Chief Complaint: Left arm pain     History of Present Illness:   10/19/2021: Caitlin Odonnell presents today 2-1/2 months status post left humerus fracture that has been treated conservatively.  Overall she does believe that she occasionally feel a pop although she has been able to work on passive range of motion as the arm is now solid enough to do this.  She is working with physical therapy pain is well controlled  Caitlin Odonnell is a 65 y.o. 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  She states that she felt a break and subsequently proceeded to the emergency room.  She denies any history of shoulder or arm pain.  The arm was functioning normal prior to this.  She was placed in a sugar-tong splint in the emergency room and given a sling.  She denies any numbness or weakness in the left hand.  She does endorse some swelling and pain about the left shoulder.  She is sleeping in her recliner to get comfortable   Surgical History:   None  PMH/PSH/Family History/Social History/Meds/Allergies:    Past Medical History:  Diagnosis Date   Diabetes mellitus without complication (HCC)    Hypertension    Past Surgical History:  Procedure Laterality Date   CATARACT EXTRACTION W/ INTRAOCULAR LENS IMPLANT     Social History   Socioeconomic History   Marital status: Widowed    Spouse name: Not on file   Number of children: Not on file   Years of education: Not on file   Highest education level: Not on file  Occupational History   Not on file  Tobacco Use   Smoking status: Not on file   Smokeless tobacco: Not on file  Substance and Sexual Activity   Alcohol use: Not on file   Drug use: Not on file   Sexual activity: Not on file  Other Topics Concern   Not on file  Social History Narrative   Not on file   Social Determinants of Health   Financial Resource  Strain: Not on file  Food Insecurity: Not on file  Transportation Needs: Not on file  Physical Activity: Not on file  Stress: Not on file  Social Connections: Not on file   No family history on file. Allergies  Allergen Reactions   Cortisone Other (See Comments)    Had some numbness in her face. States it happened after a cortisone injection in her knee, had never had reaction to it prior to this incident.    Current Outpatient Medications  Medication Sig Dispense Refill   methocarbamol (ROBAXIN) 500 MG tablet Take 1 tablet (500 mg total) by mouth 2 (two) times daily. (Patient not taking: Reported on 09/29/2021) 20 tablet 0   methocarbamol (ROBAXIN) 500 MG tablet Take 1 tablet (500 mg total) by mouth 4 (four) times daily. (Patient not taking: Reported on 09/29/2021) 30 tablet 2   oxyCODONE (OXY IR/ROXICODONE) 5 MG immediate release tablet Take 1 tablet (5 mg total) by mouth every 4 (four) hours as needed (severe pain). (Patient not taking: Reported on 09/29/2021) 20 tablet 0   oxyCODONE-acetaminophen (PERCOCET/ROXICET) 5-325 MG tablet Take 1 tablet by mouth every 6 (six) hours as needed for severe pain. (  Patient not taking: Reported on 09/29/2021) 15 tablet 0   No current facility-administered medications for this visit.   No results found.  Review of Systems:   A ROS was performed including pertinent positives and negatives as documented in the HPI.  Physical Exam :   Constitutional: NAD and appears stated age Neurological: Alert and oriented Psych: Appropriate affect and cooperative There were no vitals taken for this visit.   Comprehensive Musculoskeletal Exam:    There is no motion about the fracture site with abduction of the left arm.  There is swelling about the arm.  She is able to extend at the wrist as well as fire the EPL of the left thumb.  Sensation is intact light touch in all distributions of the left hand.  2+ radial pulse.  Range of motion is 20 degrees of active  abduction  Imaging:    X-rays 2 views left humerus: There is callus formation about the left humerus which is now increased.  Stable alignment  I personally reviewed and interpreted the radiographs.   Assessment:   65 year old female with a left midshaft humeral fracture with proximal extension.  She continues to have slow but steady healing.  I have continued to advised that ongoing callus formation is improving.  She may continue to be active range of motion as tolerated at this time.  I have advised her on specifically purchasing a pulley system to help with passive range of motion as well.  Plan :    -RTC 2 months    I personally saw and evaluated the patient, and participated in the management and treatment plan.  Huel Cote, MD Attending Physician, Orthopedic Surgery  This document was dictated using Dragon voice recognition software. A reasonable attempt at proof reading has been made to minimize errors.

## 2021-10-20 ENCOUNTER — Encounter: Payer: Self-pay | Admitting: Physical Therapy

## 2021-10-20 ENCOUNTER — Ambulatory Visit: Payer: Medicare Other | Admitting: Physical Therapy

## 2021-10-20 DIAGNOSIS — M79622 Pain in left upper arm: Secondary | ICD-10-CM

## 2021-10-20 DIAGNOSIS — R293 Abnormal posture: Secondary | ICD-10-CM

## 2021-10-20 DIAGNOSIS — M25612 Stiffness of left shoulder, not elsewhere classified: Secondary | ICD-10-CM

## 2021-10-20 DIAGNOSIS — M6281 Muscle weakness (generalized): Secondary | ICD-10-CM

## 2021-10-20 NOTE — Patient Instructions (Addendum)
° ° °  Access Code: [email or web address] URL: [email or web address] Date: 10/20/2021 Prepared by: Glenetta Hew  Exercises Seated Scapular Retraction - 2-3 x daily - 7 x weekly - 2 sets - 10 reps - 5 sec hold Shoulder Rolls in Sitting - 2-3 x daily - 7 x weekly - 2 sets - 10 reps Wrist Extension AROM - 2-3 x daily - 7 x weekly - 2 sets - 10 reps - 3 sec hold Seated Wrist Flexion AROM - 2-3 x daily - 7 x weekly - 2 sets - 10 reps - 3 sec hold Seated Forearm Pronation Supination AROM - 2-3 x daily - 7 x weekly - 2 sets - 10 reps - 3 sec hold Seated Shoulder Flexion AAROM with Pulley Behind - 1 x daily - 7 x weekly - 2 sets - 10 reps - 3 sec hold Seated Shoulder Abduction AAROM with Pulley Behind - 1 x daily - 7 x weekly - 2 sets - 10 reps - 3 sec hold Seated Shoulder Flexion Towel Slide at Table Top Full Range of Motion - 1 x daily - 7 x weekly - 2 sets - 10 reps - 3 sec hold Standing Single Arm Shoulder Flexion Towel Slide at Table Top - 1 x daily - 7 x weekly - 2 sets - 10 reps - 3 sec hold Seated Shoulder Abduction Towel Slide at Table Top - 1 x daily - 7 x weekly - 2 sets - 10 reps - 3 sec hold Shoulder Abduction Towel Slide at Table Top - 1 x daily - 7 x weekly - 2 sets - 10 reps - 3 sec hold Seated Flexion Stretch with Swiss Ball - 2-3 x daily - 7 x weekly - 3 reps - 30 sec hold

## 2021-10-20 NOTE — Therapy (Signed)
[facility] [address]  [address] [place], [place], [address] Phone: [phone]   Fax:  [phone]  Physical Therapy Treatment  Patient Details  Name: Caitlin Odonnell MRN: [id number] Date of Birth: 02/12/1956 Referring Provider (PT): Huel Cote, MD   Encounter Date: 10/20/2021   PT End of Session - 10/20/21 0851     Visit Number 4    Number of Visits 16    Date for PT Re-Evaluation 11/24/21    Authorization Type [employer]HC Medicare    Progress Note Due on Visit 13   MD PN completed on visit #3 (10/12/21) for MD appt on12/19/22   PT Start Time 0851    PT Stop Time 0933    PT Time Calculation (min) 42 min    Activity Tolerance Patient tolerated treatment well;Patient limited by pain    Behavior During Therapy [facility] for tasks assessed/performed             Past Medical History:  Diagnosis Date   Diabetes mellitus without complication (HCC)    Hypertension     Past Surgical History:  Procedure Laterality Date   CATARACT EXTRACTION W/ INTRAOCULAR LENS IMPLANT      There were no vitals filed for this visit.   Subjective Assessment - 10/20/21 0902     Subjective Pt reports she had her f/u with the MD yesterday and her released her from her shoulder brace but she was unsure of any other precautions. Per MD notes: "She may continue to be active range of motion as tolerated at this time.  I have advised her on specifically purchasing a pulley system to help with passive range of motion as well."    Pertinent History Closed displaced comminuted fracture of shaft of left humerus 07/25/21    Diagnostic tests 09/22/21 - L humerus x-ray: Healing midshaft humeral fracture in unchanged alignment. Interval peripheral callus formation.    Patient Stated Goals "to get my arm working like normal"    Currently in Pain? No/denies                               [facility] Adult PT Treatment/Exercise - 10/20/21 0851        Exercises   Exercises Shoulder      Shoulder Exercises: Seated   Flexion Left;10 reps;PROM;AAROM    Flexion Limitations table slides - limited by pain and popping in L upper arm "as if my arm were bending mid-bone"      Shoulder Exercises: Pulleys   Flexion 1 minute   x 2   Flexion Limitations facing away from and toward pulley system set-up over door - pt c/o painful popping in L upper arm with both positions      Shoulder Exercises: Therapy Ball   Flexion Both;15 reps    Flexion Limitations green PBall rollout on floor    ABduction Left;10 reps    ABduction Limitations blue/green striped ball lateral roll-out on treatment table    Scaption Both;15 reps    Scaption Limitations green PBall rollout on floor      Manual Therapy   Manual Therapy Soft tissue mobilization;Myofascial release;Other (comment)    Soft tissue mobilization STM/DTM to L anterolateral deltoid and biceps    Myofascial Release manual TPR to L anterolateral deltoid and biceps    Other Manual Therapy Instructed pt in self-STM using ball on wall  PT Education - 10/20/21 0930     Education Details HEP update - P/AAROM shoulder ROM - Access Code: [email or web address]    Person(s) Educated Patient    Methods Explanation;Demonstration;Verbal cues;Tactile cues;Handout    Comprehension Verbalized understanding;Verbal cues required;Tactile cues required;Returned demonstration;Need further instruction              PT Short Term Goals - 10/12/21 0937       PT SHORT TERM GOAL #1   Title Patient will be independent with initial HEP    Status Achieved   10/12/21              PT Long Term Goals - 10/12/21 0937       PT LONG TERM GOAL #1   Title Patient will be independent with ongoing/advanced HEP for self-management at home in order to build upon functional gains in therapy    Status On-going    Target Date 11/24/21      PT LONG TERM GOAL #2   Title Improve posture and alignment  with patient to demonstrate improved upright posture with posterior shoulder girdle engaged    Status On-going    Target Date 11/24/21      PT LONG TERM GOAL #3   Title Decrease pain in the L proximal UE by 50% allowing patient to use L UE for functional activities    Status On-going    Target Date 11/24/21      PT LONG TERM GOAL #4   Title Patient to improve L shoulder AROM to [facility] without pain provocation    Status On-going    Target Date 11/24/21      PT LONG TERM GOAL #5   Title Patient to report ability to perform ADLs, household, and work-related tasks without limitation due to L UE/shoulder pain, LOM or weakness    Status On-going    Target Date 11/24/21                   Plan - 10/20/21 0933     Clinical Impression Statement [name] reports she had her f/u with the MD yesterday and he released her from the [place] brace but did not give her clear guidance on how he wants her to proceed with PT. Review of MD visit notes indicates that she is cleared to progress shoulder AROM as tolerated, and MD recommended that she obtain a home pulley system to help with PROM. Attempted pulleys for flexion both facing pulley system and with back to pulleys with pt cautioned to avoid forcing motion into painful range, but still limited due to feeling of L upper arm popping. Similar response noted with attempts at table slides for P/AAROM with pt feeling that the table positioned her arm too high, but better tolerance for swiss ball rollouts. HEP instructions provided for both pulleys and seated as well as standing options for table slides with pt cautioned to avoid forcing painful ROM. [name] noting sensation of a rock her L upper arm musculature with increased muscle tension noted in L anterolateral deltoids and biceps - addressed with STM/DTM and manual TPR with pt educated in self-STM options for home including use of tennis ball on wall.    Rehab Potential Good    PT Frequency 2x / week     PT Duration 8 weeks    PT Treatment/Interventions ADLs/Self Care Home Management;Cryotherapy;Electrical Stimulation;Iontophoresis 4mg /ml Dexamethasone;Moist Heat;Therapeutic exercise;Therapeutic activities;Neuromuscular re-education;Patient/family education;Manual techniques;Passive range of motion;Vasopneumatic Device;Joint Manipulations;Dry needling;Taping    PT Next Visit Plan  L shoulder P/AA/AROM per pt tolerance; scapular strengthening/stabilization; modalities PRN for pain    PT Home Exercise Plan Access Code: [email or web address] (11/29, updated 12/20)    Consulted and Agree with Plan of Care Patient             Patient will benefit from skilled therapeutic intervention in order to improve the following deficits and impairments:  Decreased activity tolerance, Decreased knowledge of precautions, Decreased mobility, Decreased range of motion, Decreased strength, Increased edema, Impaired perceived functional ability, Impaired UE functional use, Improper body mechanics, Postural dysfunction, Pain  Visit Diagnosis: Pain of left upper arm  Stiffness of left shoulder, not elsewhere classified  Muscle weakness (generalized)  Abnormal posture     Problem List There are no problems to display for this patient.   Marry Guan, PT 10/20/2021, 12:28 PM  [facility] [address]  [address] [place], [place], [address] Phone: [phone]   Fax:  [phone]  Name: Caitlin Odonnell MRN: [id number] Date of Birth: [birth year]

## 2021-11-03 ENCOUNTER — Encounter: Payer: Self-pay | Admitting: Physical Therapy

## 2021-11-03 ENCOUNTER — Other Ambulatory Visit: Payer: Self-pay

## 2021-11-03 ENCOUNTER — Ambulatory Visit: Payer: Medicare Other | Attending: Orthopaedic Surgery | Admitting: Physical Therapy

## 2021-11-03 DIAGNOSIS — M25612 Stiffness of left shoulder, not elsewhere classified: Secondary | ICD-10-CM | POA: Insufficient documentation

## 2021-11-03 DIAGNOSIS — M79622 Pain in left upper arm: Secondary | ICD-10-CM | POA: Diagnosis present

## 2021-11-03 DIAGNOSIS — M6281 Muscle weakness (generalized): Secondary | ICD-10-CM | POA: Diagnosis present

## 2021-11-03 DIAGNOSIS — R293 Abnormal posture: Secondary | ICD-10-CM | POA: Insufficient documentation

## 2021-11-03 NOTE — Therapy (Signed)
[facility] [address]  [address] [place], [place], [address] Phone: [phone]   Fax:  [phone]  Physical Therapy Treatment  Patient Details  Name: Caitlin Odonnell MRN: [id number] Date of Birth: 04-29-1956 Referring Provider (PT): Huel Cote, MD   Encounter Date: 11/03/2021   PT End of Session - 11/03/21 0853     Visit Number 5    Number of Visits 16    Date for PT Re-Evaluation 11/24/21    Authorization Type [employer] Medicare    Progress Note Due on Visit 13   MD PN completed on visit #3 (10/12/21) for MD appt on12/19/22   PT Start Time 0853    PT Stop Time 0934    PT Time Calculation (min) 41 min    Activity Tolerance Patient tolerated treatment well;Patient limited by pain    Behavior During Therapy [facility] for tasks assessed/performed             Past Medical History:  Diagnosis Date   Diabetes mellitus without complication (HCC)    Hypertension     Past Surgical History:  Procedure Laterality Date   CATARACT EXTRACTION W/ INTRAOCULAR LENS IMPLANT      There were no vitals filed for this visit.   Subjective Assessment - 11/03/21 0856     Subjective Pt reports increased pain over the past few days and feels like her shoulder still has the brace on when she tries to raise her arm with the pulleys.    Pertinent History Closed displaced comminuted fracture of shaft of left humerus 07/25/21    Diagnostic tests 09/22/21 - L humerus x-ray: Healing midshaft humeral fracture in unchanged alignment. Interval peripheral callus formation.    Patient Stated Goals "to get my arm working like normal"    Currently in Pain? Yes    Pain Score 6    5-6/10   Pain Location Arm    Pain Orientation Left    Pain Descriptors / Indicators Other (Comment)   popping   Pain Type Acute pain;Chronic pain                [facility] PT Assessment - 11/03/21 0853       Assessment   Medical Diagnosis L midshaft humerus fracture     Referring Provider (PT) Huel Cote, MD    Onset Date/Surgical Date 07/25/21    Next MD Visit 12/21/21                           [facility] Adult PT Treatment/Exercise - 11/03/21 0853       Exercises   Exercises Shoulder      Shoulder Exercises: Supine   Flexion Left;10 reps;AAROM    Flexion Limitations wand - limited tolerance with feeling of upper arm popping/grinding - better tolatered in more of a chest press type of lift      Shoulder Exercises: Seated   Retraction Both;15 reps;AROM    Retraction Limitations + depression      Shoulder Exercises: Standing   Flexion Left;AAROM    Flexion Limitations attempted as opp UE supported wall slide and wand assisted AAROM but unable to tolerate either      Shoulder Exercises: Pulleys   Flexion 3 minutes    Flexion Limitations back to pulley set-up; repeated cues to avoid painful ROM    Scaption 3 minutes      Manual Therapy   Manual Therapy Joint mobilization;Soft tissue  mobilization;Myofascial release;Passive ROM    Joint Mobilization grade I-II l shoulder oscillations & CW/CCW circles    Soft tissue mobilization STM/DTM to L anterolateral deltoid, pec minor and biceps    Myofascial Release manual TPR to L anterolateral deltoid and biceps    Passive ROM L shoulder - all planes to tolerance                       PT Short Term Goals - 10/12/21 0937       PT SHORT TERM GOAL #1   Title Patient will be independent with initial HEP    Status Achieved   10/12/21              PT Long Term Goals - 10/12/21 0937       PT LONG TERM GOAL #1   Title Patient will be independent with ongoing/advanced HEP for self-management at home in order to build upon functional gains in therapy    Status On-going    Target Date 11/24/21      PT LONG TERM GOAL #2   Title Improve posture and alignment with patient to demonstrate improved upright posture with posterior shoulder girdle engaged    Status On-going     Target Date 11/24/21      PT LONG TERM GOAL #3   Title Decrease pain in the L proximal UE by 50% allowing patient to use L UE for functional activities    Status On-going    Target Date 11/24/21      PT LONG TERM GOAL #4   Title Patient to improve L shoulder AROM to [facility] without pain provocation    Status On-going    Target Date 11/24/21      PT LONG TERM GOAL #5   Title Patient to report ability to perform ADLs, household, and work-related tasks without limitation due to L UE/shoulder pain, LOM or weakness    Status On-going    Target Date 11/24/21                   Plan - 11/03/21 0859     Clinical Impression Statement Caitlin Odonnell reports she was able to obtain a home pulley set-up but reports increased pain when attempting to use pulleys at home. Performed pulleys today guiding pt in desired movement patterns, emphasizing relaxation of upper shoulder musculature as well as avoiding forcing arm into painful ROM. Tendency for shoulder hike/elevation persists in most attempts at PROM or AAROM with movements also limited by increased muscle tension in deltoids and biceps as well as continued feeling of bone popping/grinding. Limited change in muscle tension achieved with MT but slight better tolerance for PROM when pt able to reduce muscle guarding.    Rehab Potential Good    PT Frequency 2x / week    PT Duration 8 weeks    PT Treatment/Interventions ADLs/Self Care Home Management;Cryotherapy;Electrical Stimulation;Iontophoresis 4mg /ml Dexamethasone;Moist Heat;Therapeutic exercise;Therapeutic activities;Neuromuscular re-education;Patient/family education;Manual techniques;Passive range of motion;Vasopneumatic Device;Joint Manipulations;Dry needling;Taping    PT Next Visit Plan L shoulder P/AA/AROM per pt tolerance; scapular strengthening/stabilization; modalities PRN for pain    PT Home Exercise Plan Access Code: [email or web address] (11/29, updated 12/20)    Consulted and Agree with Plan of Care  Patient             Patient will benefit from skilled therapeutic intervention in order to improve the following deficits and impairments:  Decreased activity tolerance, Decreased knowledge of precautions, Decreased mobility, Decreased range of  motion, Decreased strength, Increased edema, Impaired perceived functional ability, Impaired UE functional use, Improper body mechanics, Postural dysfunction, Pain  Visit Diagnosis: Pain of left upper arm  Stiffness of left shoulder, not elsewhere classified  Muscle weakness (generalized)  Abnormal posture     Problem List There are no problems to display for this patient.   Percival Spanish, PT 11/03/2021, 12:04 PM  [facility] [address]  [place], [place], [address] Phone: [phone]   Fax:  [phone]  Name: Caitlin Odonnell MRN: [id number] Date of Birth: 1956-05-28

## 2021-11-06 ENCOUNTER — Other Ambulatory Visit: Payer: Self-pay

## 2021-11-06 ENCOUNTER — Encounter: Payer: Self-pay | Admitting: Physical Therapy

## 2021-11-06 ENCOUNTER — Ambulatory Visit: Payer: Medicare Other | Admitting: Physical Therapy

## 2021-11-06 DIAGNOSIS — M79622 Pain in left upper arm: Secondary | ICD-10-CM

## 2021-11-06 DIAGNOSIS — M25612 Stiffness of left shoulder, not elsewhere classified: Secondary | ICD-10-CM

## 2021-11-06 DIAGNOSIS — M6281 Muscle weakness (generalized): Secondary | ICD-10-CM

## 2021-11-06 DIAGNOSIS — R293 Abnormal posture: Secondary | ICD-10-CM

## 2021-11-06 NOTE — Patient Instructions (Signed)

## 2021-11-06 NOTE — Therapy (Addendum)
[facility] [place] [address]  [address] [place], [place], [address] Phone: [phone]   Fax:  [phone]  Physical Therapy Treatment  Patient Details  Name: Pryor CuriaMary Orona MRN: [id number] Date of Birth: 1955-12-21 Referring Provider (PT): Huel CoteSteven Bokshan, MD   Encounter Date: 11/06/2021   PT End of Session - 11/06/21 0836     Visit Number 6    Number of Visits 16    Date for PT Re-Evaluation 11/24/21    Authorization Type [employer] Medicare    Progress Note Due on Visit 13   MD PN completed on visit #3 (10/12/21) for MD appt on12/19/22   PT Start Time 0836    PT Stop Time 0930    PT Time Calculation (min) 54 min    Activity Tolerance Patient tolerated treatment well;Patient limited by pain    Behavior [facility] for tasks assessed/performed             Past Medical History:  Diagnosis Date   Diabetes mellitus without complication (HCC)    Hypertension     Past Surgical History:  Procedure Laterality Date   CATARACT EXTRACTION W/ INTRAOCULAR LENS IMPLANT      There were no vitals filed for this visit.   Subjective Assessment - 11/06/21 0839     Pertinent History Closed displaced comminuted fracture of shaft of left humerus 07/25/21    Diagnostic tests 09/22/21 - L humerus x-ray: Healing midshaft humeral fracture in unchanged alignment. Interval peripheral callus formation.    Patient Stated Goals "to get my arm working like normal"    Currently in Pain? Yes    Pain Score 3    4-5/10 with pulleys   Pain Location Arm    Pain Orientation Left    Pain Descriptors / Indicators Other (Comment)   "popping"   Pain Type Acute pain;Chronic pain                               [facility] Adult PT Treatment/Exercise - 11/06/21 0836       Exercises   Exercises Shoulder      Shoulder Exercises: Pulleys   Flexion 3 minutes    Flexion Limitations cues to avoid painful ROM    Scaption 3 minutes    Scaption  Limitations cues to avoid painful ROM   more painful than flexion     Shoulder Exercises: Therapy Ball   Flexion Both;20 reps    Flexion Limitations red PBall rollout on floor    ABduction Left;10 reps    ABduction Limitations blue/green striped ball lateral roll-out on treatment table    Scaption Both;20 reps    Scaption Limitations red PBall rollout on floor   2nd set attempted with TENS for pain management with slightly better tolerance noted     Shoulder Exercises: Isometric Strengthening   Flexion 5X5"    Flexion Limitations 30-50% effort - pain & popping limiting tolerance    Extension 5X5"    Extension Limitations 30-50% effort - pt reporting continued popping but less painful    External Rotation 5X5"    External Rotation Limitations 30-50% effort - less popping but painful    Internal Rotation 5X5"    Internal Rotation Limitations 30-50% effort - less popping but painful    ABduction 5X5"    ABduction Limitations 30-50% effort - pain & popping limiting tolerance    ADduction 5X5"   2  sets   ADduction Limitations 30-50% effort - denies pain or popping initially but some popping noted on last few reps      Modalities   Modalities Electrical Stimulation      Electrical Stimulation   Electrical Stimulation Location L shoulder/upper arm    Electrical Stimulation Action TENS    Electrical Stimulation Parameters SD1, intensity to pt tol x 20"   10 min while exercising and 10 min at end of session   Electrical Stimulation Goals Pain;Tone                     PT Education - 11/06/21 0930     Education Details Info on home TENS unit    Person(s) Educated Patient    Methods Explanation;Handout    Comprehension Verbalized understanding              PT Short Term Goals - 10/12/21 0937       PT SHORT TERM GOAL #1   Title Patient will be independent with initial HEP    Status Achieved   10/12/21              PT Long Term Goals - 10/12/21 0937        PT LONG TERM GOAL #1   Title Patient will be independent with ongoing/advanced HEP for self-management at home in order to build upon functional gains in therapy    Status On-going    Target Date 11/24/21      PT LONG TERM GOAL #2   Title Improve posture and alignment with patient to demonstrate improved upright posture with posterior shoulder girdle engaged    Status On-going    Target Date 11/24/21      PT LONG TERM GOAL #3   Title Decrease pain in the L proximal UE by 50% allowing patient to use L UE for functional activities    Status On-going    Target Date 11/24/21      PT LONG TERM GOAL #4   Title Patient to improve L shoulder AROM to [facility] without pain provocation    Status On-going    Target Date 11/24/21      PT LONG TERM GOAL #5   Title Patient to report ability to perform ADLs, household, and work-related tasks without limitation due to L UE/shoulder pain, LOM or weakness    Status On-going    Target Date 11/24/21                   Plan - 11/06/21 0930     Clinical Impression Statement Yarelie continues to be limited by pain and popping (grinding) sensation with nearly all attempts at [facility] using pulleys (worse in scaption than flexion), physioball or wand, as well as initiation of low effort (30-50%) isometrics with pain and popping/grinding variable depending on the activity and direction of motion. Attempted use of TENS during exercises in attempts to reduce pain for improved activity tolerance but only minimal benefit noted. Pt requiring repeated cues t/o session to avoid pushing/forcing motion through high levels of pain as pt is determined to make my arm work regardless of the pain. Info provided for home TENS unit if pt feels that it will help with pain management.    Rehab Potential Good    PT Frequency 2x / week    PT Duration 8 weeks    PT Treatment/Interventions ADLs/Self Care Home Management;Cryotherapy;Electrical Stimulation;Iontophoresis 4mg /ml  Dexamethasone;Moist Heat;Therapeutic exercise;Therapeutic activities;Neuromuscular re-education;Patient/family education;Manual techniques;Passive range of  motion;Vasopneumatic Device;Joint Manipulations;Dry needling;Taping    PT Next Visit Plan L shoulder P/AA/AROM per pt tolerance - try Swiffer as UE ranger; scapular strengthening/stabilization; modalities PRN for pain    PT Home Exercise Plan Access Code: [email or web address] (11/29, updated 12/20)    Consulted and Agree with Plan of Care Patient             Patient will benefit from skilled therapeutic intervention in order to improve the following deficits and impairments:  Decreased activity tolerance, Decreased knowledge of precautions, Decreased mobility, Decreased range of motion, Decreased strength, Increased edema, Impaired perceived functional ability, Impaired UE functional use, Improper body mechanics, Postural dysfunction, Pain  Visit Diagnosis: Pain of left upper arm  Stiffness of left shoulder, not elsewhere classified  Muscle weakness (generalized)  Abnormal posture     Problem List There are no problems to display for this patient.   Marry Guan, PT 11/06/2021, 1:26 PM  [facility] [address]  [address] [place], [place], [address] Phone: [phone]   Fax:  [phone]  Name: Rotha Cassels MRN: [id number] Date of Birth: [birth year]

## 2021-11-10 ENCOUNTER — Other Ambulatory Visit: Payer: Self-pay

## 2021-11-10 ENCOUNTER — Ambulatory Visit: Payer: Medicare Other

## 2021-11-10 DIAGNOSIS — R293 Abnormal posture: Secondary | ICD-10-CM

## 2021-11-10 DIAGNOSIS — M79622 Pain in left upper arm: Secondary | ICD-10-CM

## 2021-11-10 DIAGNOSIS — M25612 Stiffness of left shoulder, not elsewhere classified: Secondary | ICD-10-CM

## 2021-11-10 DIAGNOSIS — M6281 Muscle weakness (generalized): Secondary | ICD-10-CM

## 2021-11-10 NOTE — Therapy (Signed)
[facility] [address]  [address] [place], [place], [address] Phone: [phone]   Fax:  [phone]  Physical Therapy Treatment  Patient Details  Name: Caitlin Odonnell MRN: [id number] Date of Birth: August 29, 1956 Referring Provider (PT): Huel Cote, MD   Encounter Date: 11/10/2021   PT End of Session - 11/10/21 0935     Visit Number 7    Number of Visits 16    Date for PT Re-Evaluation 11/24/21    Authorization Type UHC Medicare    Progress Note Due on Visit 13   MD PN completed on visit #3 (10/12/21) for MD appt on12/19/22   PT Start Time 0844    PT Stop Time 0930    PT Time Calculation (min) 46 min    Activity Tolerance Patient tolerated treatment well;Patient limited by pain    Behavior During Therapy [facility] for tasks assessed/performed             Past Medical History:  Diagnosis Date   Diabetes mellitus without complication (HCC)    Hypertension     Past Surgical History:  Procedure Laterality Date   CATARACT EXTRACTION W/ INTRAOCULAR LENS IMPLANT      There were no vitals filed for this visit.   Subjective Assessment - 11/10/21 0849     Subjective Pt reports that when she raises her arm pain shoots up to a 7/10, feels like bones rubbing against bone.    Pertinent History Closed displaced comminuted fracture of shaft of left humerus 07/25/21    Diagnostic tests 09/22/21 - L humerus x-ray: Healing midshaft humeral fracture in unchanged alignment. Interval peripheral callus formation.    Patient Stated Goals "to get my arm working like normal"    Currently in Pain? Yes    Pain Score 1     Pain Location Arm    Pain Orientation Left    Pain Descriptors / Indicators --   bones scrubbing together   Pain Type Acute pain                               [facility] Adult PT Treatment/Exercise - 11/10/21 0001       Shoulder Exercises: Supine   Flexion AAROM;Both;15 reps    Flexion Limitations  with cane, better after manual     Shoulder Exercises: Seated   Flexion AAROM;Both;10 reps    Flexion Limitations fwd lean into cane    Abduction AAROM;Both;10 reps    ABduction Limitations fwd lean into cane    Other Seated Exercises scapular depressions 10x    Other Seated Exercises chest pressed with cane, TC to keep scapula stable 10x      Shoulder Exercises: Standing   Row Strengthening;Both;10 reps;Theraband    Theraband Level (Shoulder Row) Level 1 (Yellow)    Retraction Strengthening;Both;20 reps;Theraband    Theraband Level (Shoulder Retraction) Level 1 (Yellow)      Shoulder Exercises: Pulleys   Flexion 2 minutes    Flexion Limitations pt limited by pain had to stop after 2 min      Manual Therapy   Manual Therapy Passive ROM;Soft tissue mobilization    Soft tissue mobilization STM to L biceps, brachialis    Passive ROM L shoulder - all planes to tolerance, GH joint distraction with hold                       PT  Short Term Goals - 10/12/21 0937       PT SHORT TERM GOAL #1   Title Patient will be independent with initial HEP    Status Achieved   10/12/21              PT Long Term Goals - 10/12/21 0937       PT LONG TERM GOAL #1   Title Patient will be independent with ongoing/advanced HEP for self-management at home in order to build upon functional gains in therapy    Status On-going    Target Date 11/24/21      PT LONG TERM GOAL #2   Title Improve posture and alignment with patient to demonstrate improved upright posture with posterior shoulder girdle engaged    Status On-going    Target Date 11/24/21      PT LONG TERM GOAL #3   Title Decrease pain in the L proximal UE by 50% allowing patient to use L UE for functional activities    Status On-going    Target Date 11/24/21      PT LONG TERM GOAL #4   Title Patient to improve [facility] without pain provocation    Status On-going    Target Date 11/24/21      PT LONG  TERM GOAL #5   Title Patient to report ability to perform ADLs, household, and work-related tasks without limitation due to L UE/shoulder pain, LOM or weakness    Status On-going    Target Date 11/24/21                   Plan - 11/10/21 0949     Clinical Impression Statement Pt had pain with the pulleys today and any OH movements. We worked on scapular stability exercises focusing on retraction and depression. Noticed that she tends to sit with her L soulder fwd and lets her shoulder protract when any fwd reaching motion, indicating some scapular dyskinesia. We did some STM to the L biceps and brachialis, which improved her fwd flexion in supine. She had decreased pain post session with L shoulder AROM.    Personal Factors and Comorbidities Comorbidity 2;Past/Current Experience;Time since onset of injury/illness/exacerbation;Fitness    Comorbidities DM, HTN    PT Frequency 2x / week    PT Duration 8 weeks    PT Treatment/Interventions ADLs/Self Care Home Management;Cryotherapy;Electrical Stimulation;Iontophoresis 4mg /ml Dexamethasone;Moist Heat;Therapeutic exercise;Therapeutic activities;Neuromuscular re-education;Patient/family education;Manual techniques;Passive range of motion;Vasopneumatic Device;Joint Manipulations;Dry needling;Taping    PT Next Visit Plan scapular strengthening/stabilization; L shoulder P/AA/AROM per pt tolerance - try Swiffer as UE ranger; modalities PRN for pain    PT Home Exercise Plan Access Code: [email or web address] (11/29, updated 12/20)    Consulted and Agree with Plan of Care Patient             Patient will benefit from skilled therapeutic intervention in order to improve the following deficits and impairments:  Decreased activity tolerance, Decreased knowledge of precautions, Decreased mobility, Decreased range of motion, Decreased strength, Increased edema, Impaired perceived functional ability, Impaired UE functional use, Improper body mechanics, Postural  dysfunction, Pain  Visit Diagnosis: Pain of left upper arm  Stiffness of left shoulder, not elsewhere classified  Muscle weakness (generalized)  Abnormal posture     Problem List There are no problems to display for this patient.   Darleene CleaverBraylin L Kristan Votta, PTA 11/10/2021, 9:56 AM  [facility] [place] [address]  [address] [place], [place],  [address] Phone: [phone]   Fax:  [phone]  Name: Ridhi Hoffert MRN: [id number] Date of Birth: [birth year]

## 2021-11-13 ENCOUNTER — Ambulatory Visit: Payer: Medicare Other | Admitting: Physical Therapy

## 2021-11-13 ENCOUNTER — Encounter: Payer: Self-pay | Admitting: Physical Therapy

## 2021-11-13 ENCOUNTER — Other Ambulatory Visit: Payer: Self-pay

## 2021-11-13 DIAGNOSIS — M79622 Pain in left upper arm: Secondary | ICD-10-CM

## 2021-11-13 DIAGNOSIS — R293 Abnormal posture: Secondary | ICD-10-CM

## 2021-11-13 DIAGNOSIS — M25612 Stiffness of left shoulder, not elsewhere classified: Secondary | ICD-10-CM

## 2021-11-13 DIAGNOSIS — M6281 Muscle weakness (generalized): Secondary | ICD-10-CM

## 2021-11-13 NOTE — Therapy (Signed)
[facility] [place] [address]  [address] [place], [place], [address] Phone: [phone]   Fax:  [phone]  Physical Therapy Treatment  Patient Details  Name: Caitlin CuriaMary Odonnell MRN: [id number] Date of Birth: 06-03-1956 Referring Provider (PT): Huel CoteSteven Bokshan, MD   Encounter Date: 11/13/2021   PT End of Session - 11/13/21 0849     Visit Number 8    Number of Visits 16    Date for PT Re-Evaluation 11/24/21    Authorization Type [employer] Medicare    Progress Note Due on Visit 13   MD PN completed on visit #3 (10/12/21) for MD appt on12/19/22   PT Start Time 0849    PT Stop Time 0933    PT Time Calculation (min) 44 min    Activity Tolerance Patient tolerated treatment well;Patient limited by pain    Behavior During [facility] for tasks assessed/performed             Past Medical History:  Diagnosis Date   Diabetes mellitus without complication (HCC)    Hypertension     Past Surgical History:  Procedure Laterality Date   CATARACT EXTRACTION W/ INTRAOCULAR LENS IMPLANT      There were no vitals filed for this visit.   Subjective Assessment - 11/13/21 0852     Subjective Pt reports a rough night last night but minimal pain this morning until she starts to try to raise the arm - then the pain becomes much more intense with conitnued feeling of nones rubbing/grinding.    Pertinent History Closed displaced comminuted fracture of shaft of left humerus 07/25/21    Diagnostic tests 09/22/21 - L humerus x-ray: Healing midshaft humeral fracture in unchanged alignment. Interval peripheral callus formation.    Patient Stated Goals "to get my arm working like normal"    Currently in Pain? Yes    Pain Score 1     Pain Location Arm    Pain Orientation Left    Pain Type Acute pain                               [facility] Adult PT Treatment/Exercise - 11/13/21 0849       Exercises   Exercises Shoulder      Shoulder  Exercises: Seated   Other Seated Exercises UE ranger using swiffer - flexion/extension, horiz ABD/ADD, CW/CCW circle (stirring motion) with arm in ~60-70 flexion; protraction/retraction in scapular plane, abd/add in scaption/abduction plane - hand supported on shaft of swiffer x 10-15 reps for each pattern      Shoulder Exercises: Standing   Row Both;10 reps;Strengthening;Theraband   2 sets   Theraband Level (Shoulder Row) Level 1 (Yellow)    Row Limitations focus on scap retraction & depression with cues for slower pace and increased hold time    Retraction Both;5 reps;Strengthening;Theraband    Theraband Level (Shoulder Retraction) Level 1 (Yellow)    Retraction Limitations attempted with mini-shoulder extension but deferred d/t increased pain      Shoulder Exercises: Pulleys   Flexion 3 minutes    Flexion Limitations cues to avoid painful ROM and keep shoulder relaxed    Scaption 3 minutes    Scaption Limitations cues to avoid painful ROM   more painful than flexion     Shoulder Exercises: Isometric Strengthening   Flexion 5X5"    Flexion Limitations 30-50% effort into towel on wall    Extension 5X5"  Extension Limitations 30-50% effort into towel on doorframe d/t unable to get elbow back far enough to wall when standing with back to wall    External Rotation 5X5"    External Rotation Limitations 30-50% effort into towel on doorframe    Internal Rotation 5X5"    Internal Rotation Limitations 30-50% effort into towel on doorframe    ABduction 5X5"    ABduction Limitations 30-50% effort into towel on wall    ADduction 5X5"    ADduction Limitations 30-50% effort into towel under arm                     PT Education - 11/13/21 0931     Education Details HEP update - scpaular retraction/rows with yellow TB, small ROM self-ranging using mop/swiffer - Access Code: [email or web address]    Person(s) Educated Patient    Methods Explanation;Demonstration;Verbal cues;Handout     Comprehension Verbalized understanding;Verbal cues required;Returned demonstration;Need further instruction              PT Short Term Goals - 10/12/21 0937       PT SHORT TERM GOAL #1   Title Patient will be independent with initial HEP    Status Achieved   10/12/21              PT Long Term Goals - 10/12/21 0937       PT LONG TERM GOAL #1   Title Patient will be independent with ongoing/advanced HEP for self-management at home in order to build upon functional gains in therapy    Status On-going    Target Date 11/24/21      PT LONG TERM GOAL #2   Title Improve posture and alignment with patient to demonstrate improved upright posture with posterior shoulder girdle engaged    Status On-going    Target Date 11/24/21      PT LONG TERM GOAL #3   Title Decrease pain in the L proximal UE by 50% allowing patient to use L UE for functional activities    Status On-going    Target Date 11/24/21      PT LONG TERM GOAL #4   Title Patient to improve L shoulder AROM to [facility] without pain provocation    Status On-going    Target Date 11/24/21      PT LONG TERM GOAL #5   Title Patient to report ability to perform ADLs, household, and work-related tasks without limitation due to L UE/shoulder pain, LOM or weakness    Status On-going    Target Date 11/24/21                   Plan - 11/13/21 0933     Clinical Impression Statement Caitlin Odonnell reports only low-level pain at rest but states pain increases significantly with any attempts at [facility] with continued feeling of bone on bone scrubbing with most movement attempts. She continues to require frequent cues to relax shoulder and to avoid pushing/forcing movements through high pain levels as she states she is willing to work through the pain to make my shoulder work. Continued scapular activation exercises to promote improved shoulder alignment with yellow TB rows to neutral well tolerated but increased pain reported with  straight arm retraction with mini-shoulder extension - HEP update with only the former. Slightly better tolerance for isometrics today but still painful with some motions. Utilized Swiffter as simulated UE ranger to allow for gentle small arc ROM to promote motion without as much upper  shoulder substitution - encourage pt to try small range motion with mop at home.    Comorbidities DM, HTN    Rehab Potential Good    PT Frequency 2x / week    PT Duration 8 weeks    PT Treatment/Interventions ADLs/Self Care Home Management;Cryotherapy;Electrical Stimulation;Iontophoresis 4mg /ml Dexamethasone;Moist Heat;Therapeutic exercise;Therapeutic activities;Neuromuscular re-education;Patient/family education;Manual techniques;Passive range of motion;Vasopneumatic Device;Joint Manipulations;Dry needling;Taping    PT Next Visit Plan scapular strengthening/stabilization; L shoulder P/AA/AROM per pt tolerance - Swiffer as UE ranger; modalities PRN for pain    PT Home Exercise Plan Access Code: (11/29, updated 12/20)    Consulted and Agree with Plan of Care Patient             Patient will benefit from skilled therapeutic intervention in order to improve the following deficits and impairments:  Decreased activity tolerance, Decreased knowledge of precautions, Decreased mobility, Decreased range of motion, Decreased strength, Increased edema, Impaired perceived functional ability, Impaired UE functional use, Improper body mechanics, Postural dysfunction, Pain  Visit Diagnosis: Pain of left upper arm  Stiffness of left shoulder, not elsewhere classified  Muscle weakness (generalized)  Abnormal posture     Problem List There are no problems to display for this patient.   1/21, PT 11/13/2021, 10:57 AM  [facility] [address]  [address] [place], [place], [place] Phone: [phone]   Fax:  [phone]  Name: Caitlin Odonnell MRN:  Caitlin Odonnell Date of Birth: 11/20/1955

## 2021-11-13 NOTE — Patient Instructions (Signed)
° ° °  Access Code: [email or web address] URL: [email or web address] Date: 11/13/2021 Prepared by: Annie Paras  Exercises Seated Scapular Retraction - 2-3 x daily - 7 x weekly - 2 sets - 10 reps - 5 sec hold Shoulder Rolls in Sitting - 2-3 x daily - 7 x weekly - 2 sets - 10 reps Wrist Extension AROM - 2-3 x daily - 7 x weekly - 2 sets - 10 reps - 3 sec hold Seated Wrist Flexion AROM - 2-3 x daily - 7 x weekly - 2 sets - 10 reps - 3 sec hold Seated Forearm Pronation Supination AROM - 2-3 x daily - 7 x weekly - 2 sets - 10 reps - 3 sec hold Seated Shoulder Flexion AAROM with Pulley Behind - 1 x daily - 7 x weekly - 2 sets - 10 reps - 3 sec hold Seated Shoulder Abduction AAROM with Pulley Behind - 1 x daily - 7 x weekly - 2 sets - 10 reps - 3 sec hold Seated Shoulder Flexion Towel Slide at Table Top Full Range of Motion - 1 x daily - 7 x weekly - 2 sets - 10 reps - 3 sec hold Standing Single Arm Shoulder Flexion Towel Slide at Table Top - 1 x daily - 7 x weekly - 2 sets - 10 reps - 3 sec hold Seated Shoulder Abduction Towel Slide at Table Top - 1 x daily - 7 x weekly - 2 sets - 10 reps - 3 sec hold Shoulder Abduction Towel Slide at Table Top - 1 x daily - 7 x weekly - 2 sets - 10 reps - 3 sec hold Seated Flexion Stretch with Swiss Ball - 2-3 x daily - 7 x weekly - 3 reps - 30 sec hold Standing Bilateral Low Shoulder Row with Anchored Resistance - 1 x daily - 7 x weekly - 2 sets - 10 reps - 5 sec hold  Patient Education TENS Unit TENS Therapy

## 2021-11-20 ENCOUNTER — Ambulatory Visit: Payer: Medicare Other | Admitting: Physical Therapy

## 2021-11-27 ENCOUNTER — Ambulatory Visit: Payer: Medicare Other

## 2021-11-27 ENCOUNTER — Other Ambulatory Visit: Payer: Self-pay

## 2021-11-27 DIAGNOSIS — R293 Abnormal posture: Secondary | ICD-10-CM

## 2021-11-27 DIAGNOSIS — M25612 Stiffness of left shoulder, not elsewhere classified: Secondary | ICD-10-CM

## 2021-11-27 DIAGNOSIS — M6281 Muscle weakness (generalized): Secondary | ICD-10-CM

## 2021-11-27 DIAGNOSIS — M79622 Pain in left upper arm: Secondary | ICD-10-CM | POA: Diagnosis not present

## 2021-11-27 NOTE — Therapy (Addendum)
[facility] [address]  [address] [place], [place], [address] Phone: [phone]   Fax:  [phone]  Physical Therapy Treatment / Recert  Patient Details  Name: Caitlin Odonnell MRN: [id number] Date of Birth: Jun 07, 1956 Referring Provider (PT): Huel Cote, MD  Progress Note  Reporting Period 10/12/2032 to 11/27/2021  See note below for Objective Data and Assessment of Progress/Goals.     Encounter Date: 11/27/2021   PT End of Session - 11/27/21 1007     Visit Number 9    Number of Visits 25    Date for PT Re-Evaluation 01/22/22    Authorization Type [employer] Medicare    Progress Note Due on Visit 19   recert on visit #9   PT Start Time 0848    PT Stop Time 0928    PT Time Calculation (min) 40 min    Activity Tolerance Patient tolerated treatment well;Patient limited by pain    Behavior During Therapy WFL for tasks assessed/performed             Past Medical History:  Diagnosis Date   Diabetes mellitus without complication (HCC)    Hypertension     Past Surgical History:  Procedure Laterality Date   CATARACT EXTRACTION W/ INTRAOCULAR LENS IMPLANT      There were no vitals filed for this visit.   Subjective Assessment - 11/27/21 0849     Subjective Pt notes that her arm has been worsening, has been keeping up with HEP but still feeling that rubbing bone pain.    Pertinent History Closed displaced comminuted fracture of shaft of left humerus 07/25/21    Diagnostic tests 09/22/21 - L humerus x-ray: Healing midshaft humeral fracture in unchanged alignment. Interval peripheral callus formation.    Patient Stated Goals "to get my arm working like normal"    Currently in Pain? Yes    Pain Score 5     Pain Location Arm    Pain Orientation Left    Pain Type Acute pain                [facility] PT Assessment - 11/27/21 0001       Assessment   Medical Diagnosis L midshaft humerus fracture    Referring Provider  (PT) Huel Cote, MD    Onset Date/Surgical Date 07/25/21    Next MD Visit 12/21/21      PROM   Left Shoulder Flexion 110 Degrees   pt limited by pain   Left Shoulder ABduction 91 Degrees   pt limited by pain   Left Shoulder External Rotation 62 Degrees                           [facility] Adult PT Treatment/Exercise - 11/27/21 0001       Exercises   Exercises Shoulder      Shoulder Exercises: Seated   Other Seated Exercises 3 way table slides 15 reps each      Shoulder Exercises: Pulleys   Flexion 3 minutes    Scaption 3 minutes                       PT Short Term Goals - 10/12/21 0937       PT SHORT TERM GOAL #1   Title Patient will be independent with initial HEP    Status Achieved   10/12/21  PT Long Term Goals - 11/27/21 0856       PT LONG TERM GOAL #1   Title Patient will be independent with ongoing/advanced HEP for self-management at home in order to build upon functional gains in therapy    Status On-going    Target Date 01/22/22      PT LONG TERM GOAL #2   Title Improve posture and alignment with patient to demonstrate improved upright posture with posterior shoulder girdle engaged    Status On-going   pt demonstrates fwd shoulder posture and scapular elevation at rest.   Target Date 01/22/22      PT LONG TERM GOAL #3   Title Decrease pain in the L proximal UE by 50% allowing patient to use L UE for functional activities    Status On-going   pt does not report any improvement in arm pain   Target Date 01/22/22      PT LONG TERM GOAL #4   Title Patient to improve L shoulder AROM to [facility] without pain provocation    Status On-going   1/27- flex and ABD decreased but pt was limited by pain   Target Date 01/22/22      PT LONG TERM GOAL #5   Title Patient to report ability to perform ADLs, household, and work-related tasks without limitation due to L UE/shoulder pain, LOM or weakness    Status On-going   pt  reports difficulty with lifting weighted objects, OH reaching   Target Date 01/22/22                   Plan - 11/27/21 1007     Clinical Impression Statement Pt has missed the past 2 weeks of PT due to MD appointments conflicting with PT scheduling. She reported a regression of progress and increased pain lately. Pt most likely is seeing this regression due to missing therapy for a while. Her shoulder ROM was limited by pain, which I feel is the reason why her measurements were less than before. Pt reported difficulty with lifting weighted objects and reaching over her head with ADLs like reaching into cabinets or grabbing a plate. She constantly sits in a fwd shoulder position with scap elevation, thus she is still very guarded. Still complains of the bone scrubbing pain when trying to lift her arm. Pt would continue to benefit from PT for an additional 2x per week for 8 weeks in order to address the aforementioned deficits to restore her everyday function.    Personal Factors and Comorbidities Comorbidity 2;Past/Current Experience;Time since onset of injury/illness/exacerbation;Fitness    Comorbidities DM, HTN    PT Frequency 2x / week    PT Duration 8 weeks    PT Treatment/Interventions ADLs/Self Care Home Management;Cryotherapy;Electrical Stimulation;Iontophoresis 4mg /ml Dexamethasone;Moist Heat;Therapeutic exercise;Therapeutic activities;Neuromuscular re-education;Patient/family education;Manual techniques;Passive range of motion;Vasopneumatic Device;Joint Manipulations;Dry needling;Taping    PT Next Visit Plan scapular strengthening/stabilization; L shoulder P/AA/AROM per pt tolerance - Swiffer as UE ranger; modalities PRN for pain    PT Home Exercise Plan Access Code: (11/29, updated 12/20)    Consulted and Agree with Plan of Care Patient             Patient will benefit from skilled therapeutic intervention in order to improve the following deficits and impairments:   Decreased activity tolerance, Decreased knowledge of precautions, Decreased mobility, Decreased range of motion, Decreased strength, Increased edema, Impaired perceived functional ability, Impaired UE functional use, Improper body mechanics, Postural dysfunction, Pain  Visit Diagnosis: Pain  of left upper arm  Stiffness of left shoulder, not elsewhere classified  Muscle weakness (generalized)  Abnormal posture     Problem List There are no problems to display for this patient.   Darleene CleaverBraylin L Colon Rueth, PTA 11/27/2021, 10:44 AM  [facility] [place] [address]  [address] [place], [place], [address] Phone: [phone]   Fax:  [phone]  Name: Caitlin Odonnell MRN: [id number] Date of Birth: 1955/12/03   Jenni's progress remains limited with PT due to ongoing moderate pain in her L upper arm with the sensation that her arm is under constant pressure as if continuously in a BP cuff as well as a a "scrubbing" or "grinding" pain with most active movements of her L shoulder/UE. She continues to struggle with proper postural alignment of her shoulder which further reduces her ability to functionally use her shoulder musculature to elevate her arm, hence we have recently seen a regression in her shoulder AROM after she has missed the last 2 weeks in PT. Given ongoing pain, ROM and strength deficits limiting functional use of L UE, will recommend recert for additional 2x/wk for up to 8 weeks.   Marry GuanJoAnne M. Kreis, PT, MPT 11/27/21, 12:19 PM  [facility] [place] [address]  [address] [place], [place], [address] Phone: [phone]   Fax:  [phone]

## 2021-11-27 NOTE — Addendum Note (Signed)
Addended by: Marry Guan on: 11/27/2021 12:26 PM   Modules accepted: Orders

## 2021-12-01 ENCOUNTER — Other Ambulatory Visit: Payer: Self-pay

## 2021-12-01 ENCOUNTER — Encounter: Payer: Self-pay | Admitting: Physical Therapy

## 2021-12-01 ENCOUNTER — Ambulatory Visit: Payer: Medicare Other | Admitting: Physical Therapy

## 2021-12-01 DIAGNOSIS — M79622 Pain in left upper arm: Secondary | ICD-10-CM | POA: Diagnosis not present

## 2021-12-01 DIAGNOSIS — M25612 Stiffness of left shoulder, not elsewhere classified: Secondary | ICD-10-CM

## 2021-12-01 DIAGNOSIS — R293 Abnormal posture: Secondary | ICD-10-CM

## 2021-12-01 DIAGNOSIS — M6281 Muscle weakness (generalized): Secondary | ICD-10-CM

## 2021-12-01 NOTE — Therapy (Signed)
[facility] [address]  [address] [place], [place], [address] Phone: [phone]   Fax:  [phone]  Physical Therapy Treatment  Patient Details  Name: Caitlin Odonnell MRN: [id number] Date of Birth: 12-21-1955 Referring Provider (PT): Huel Cote, MD   Encounter Date: 12/01/2021   PT End of Session - 12/01/21 0846     Visit Number 10    Number of Visits 25    Date for PT Re-Evaluation 01/22/22    Authorization Type [employer] Medicare    Progress Note Due on Visit 19   recert on visit #9   PT Start Time 0846    PT Stop Time 0930    PT Time Calculation (min) 44 min    Activity Tolerance Patient tolerated treatment well;Patient limited by pain    Behavior During Therapy [facility] for tasks assessed/performed             Past Medical History:  Diagnosis Date   Diabetes mellitus without complication (HCC)    Hypertension     Past Surgical History:  Procedure Laterality Date   CATARACT EXTRACTION W/ INTRAOCULAR LENS IMPLANT      There were no vitals filed for this visit.   Subjective Assessment - 12/01/21 0850     Subjective Pt reports less of the "scrubbing" feel as if the bones are rubbing together, but states the pain has been worse especially at night - the only way she can sleep is sitting in her recliner with her arm propped. She states she feels that her arm is in a BP cuff under constant pressure.    Pertinent History Closed displaced comminuted fracture of shaft of left humerus 07/25/21    Diagnostic tests 09/22/21 - L humerus x-ray: Healing midshaft humeral fracture in unchanged alignment. Interval peripheral callus formation.    Patient Stated Goals "to get my arm working like normal"    Currently in Pain? Yes    Pain Score 1     Pain Location Arm    Pain Orientation Left    Pain Descriptors / Indicators Aching;Pressure   bones "scrubbing" together   Pain Type Acute pain;Chronic pain    Pain Frequency  Intermittent                               [facility] Adult PT Treatment/Exercise - 12/01/21 0846       Exercises   Exercises Shoulder      Neck Exercises: Stretches   Upper Trapezius Stretch Left;2 reps;30 seconds;60 seconds    Upper Trapezius Stretch Limitations L hand on edge of seat; 2nd rep with gentle overpressure from R hand    Levator Stretch Left;2 reps;30 seconds;60 seconds    Levator Stretch Limitations L hand on edge of seat; 2nd rep with gentle overpressure from R hand      Shoulder Exercises: Pulleys   Flexion 3 minutes    Flexion Limitations cues to avoid painful ROM and keep shoulder relaxed    Scaption 3 minutes    Scaption Limitations cues to avoid painful ROM   better tolerated than flexion today     Shoulder Exercises: ROM/Strengthening   Lat Pull 10 reps   5#   Lat Pull Limitations standing pull-down to chest x 10; straight arm pull down x 5      Manual Therapy   Manual Therapy Soft tissue mobilization;Myofascial release;Scapular mobilization;Joint mobilization    Joint Mobilization  grade II-II caudal glide/distraction with straight arm ER in sitting    Soft tissue mobilization STM/DTM to L UT, LS, pecs, deltoids, biceps and brachialis    Myofascial Release manual TPR to L UT, LS, anterolateral deltoid and biceps    Scapular Mobilization L scapular mobs all directions in sitting - emphasis on retraction and depression                     PT Education - 12/01/21 0930     Education Details HEP update - L UT & LS stretches - Access Code: [email or web address]; info on potential Trigger Point Dry Needling    Person(s) Educated Patient    Methods Explanation;Demonstration;Verbal cues;Tactile cues;Handout    Comprehension Verbalized understanding;Verbal cues required;Tactile cues required;Returned demonstration;Need further instruction              PT Short Term Goals - 10/12/21 0937       PT SHORT TERM GOAL #1   Title Patient will be  independent with initial HEP    Status Achieved   10/12/21              PT Long Term Goals - 11/27/21 0856       PT LONG TERM GOAL #1   Title Patient will be independent with ongoing/advanced HEP for self-management at home in order to build upon functional gains in therapy    Status On-going    Target Date 01/22/22      PT LONG TERM GOAL #2   Title Improve posture and alignment with patient to demonstrate improved upright posture with posterior shoulder girdle engaged    Status On-going   pt demonstrates fwd shoulder posture and scapular elevation at rest.   Target Date 01/22/22      PT LONG TERM GOAL #3   Title Decrease pain in the L proximal UE by 50% allowing patient to use L UE for functional activities    Status On-going   pt does not report any improvement in arm pain   Target Date 01/22/22      PT LONG TERM GOAL #4   Title Patient to improve L shoulder AROM to [facility] without pain provocation    Status On-going   1/27- flex and ABD decreased but pt was limited by pain   Target Date 01/22/22      PT LONG TERM GOAL #5   Title Patient to report ability to perform ADLs, household, and work-related tasks without limitation due to L UE/shoulder pain, LOM or weakness    Status On-going   pt reports difficulty with lifting weighted objects, OH reaching   Target Date 01/22/22                   Plan - 12/01/21 0930     Clinical Impression Statement Caitlin Odonnell reports ongoing limitations in L shoulder ROM and functional use of L UE due to pain, scrubbing sensation at fracture site as well as increased muscle tension/tightness in upper arm and shoulder with pt describing sensation that her upper arm is constantly under pressure as if in a permanently inflated BP cuff. Abnormal muscle tension/tightness and guarding result in persistently elevated and forward rounded shoulder which prevents normal GH mechanics and likely results in increased pain and limited ROM due to RTC  impingement. She continues to struggle with scapular and RTC muscle activation and only limited reduction in muscle tension achievable with MT. Will consider DN to address abnormal muscle tension pending clearance from  MD to DN near fracture site.    Comorbidities DM, HTN    Rehab Potential Good    PT Frequency 2x / week    PT Duration 8 weeks    PT Treatment/Interventions ADLs/Self Care Home Management;Cryotherapy;Electrical Stimulation;Iontophoresis 4mg /ml Dexamethasone;Moist Heat;Therapeutic exercise;Therapeutic activities;Neuromuscular re-education;Patient/family education;Manual techniques;Passive range of motion;Vasopneumatic Device;Joint Manipulations;Dry needling;Taping    PT Next Visit Plan DN to L shoulder complex if approved by MD; scapular strengthening/stabilization; L shoulder P/AA/AROM per pt tolerance - Swiffer as UE ranger; modalities PRN for pain    PT Home Exercise Plan Access Code: [email or web address] (11/29, updated 12/20 & 1/13)    Consulted and Agree with Plan of Care Patient             Patient will benefit from skilled therapeutic intervention in order to improve the following deficits and impairments:  Decreased activity tolerance, Decreased knowledge of precautions, Decreased mobility, Decreased range of motion, Decreased strength, Increased edema, Impaired perceived functional ability, Impaired UE functional use, Improper body mechanics, Postural dysfunction, Pain  Visit Diagnosis: Pain of left upper arm  Stiffness of left shoulder, not elsewhere classified  Muscle weakness (generalized)  Abnormal posture     Problem List There are no problems to display for this patient.   Caitlin Odonnell, PT 12/01/2021, 7:34 PM  [facility] [place] [address]  [address] [place], [place], [address] Phone: [phone]   Fax:  [phone]  Name: Caitlin CuriaMary Odonnell MRN: [id number] Date of Birth: 07-Nov-1955

## 2021-12-01 NOTE — Patient Instructions (Signed)
° °  Access Code: [email or web address] URL: [email or web address] Date: 12/01/2021 Prepared by: Glenetta Hew  Exercises Seated Scapular Retraction - 2-3 x daily - 7 x weekly - 2 sets - 10 reps - 5 sec hold Shoulder Rolls in Sitting - 2-3 x daily - 7 x weekly - 2 sets - 10 reps Wrist Extension AROM - 2-3 x daily - 7 x weekly - 2 sets - 10 reps - 3 sec hold Seated Wrist Flexion AROM - 2-3 x daily - 7 x weekly - 2 sets - 10 reps - 3 sec hold Seated Forearm Pronation Supination AROM - 2-3 x daily - 7 x weekly - 2 sets - 10 reps - 3 sec hold Seated Shoulder Flexion AAROM with Pulley Behind - 1 x daily - 7 x weekly - 2 sets - 10 reps - 3 sec hold Seated Shoulder Abduction AAROM with Pulley Behind - 1 x daily - 7 x weekly - 2 sets - 10 reps - 3 sec hold Seated Shoulder Flexion Towel Slide at Table Top Full Range of Motion - 1 x daily - 7 x weekly - 2 sets - 10 reps - 3 sec hold Standing Single Arm Shoulder Flexion Towel Slide at Table Top - 1 x daily - 7 x weekly - 2 sets - 10 reps - 3 sec hold Seated Shoulder Abduction Towel Slide at Table Top - 1 x daily - 7 x weekly - 2 sets - 10 reps - 3 sec hold Shoulder Abduction Towel Slide at Table Top - 1 x daily - 7 x weekly - 2 sets - 10 reps - 3 sec hold Seated Flexion Stretch with Swiss Ball - 2-3 x daily - 7 x weekly - 3 reps - 30 sec hold Standing Bilateral Low Shoulder Row with Anchored Resistance - 1 x daily - 7 x weekly - 2 sets - 10 reps - 5 sec hold Seated Gentle Upper Trapezius Stretch - 2-3 x daily - 7 x weekly - 3 reps - 30 sec hold Seated Cervical Sidebending Stretch - 2-3 x daily - 7 x weekly - 3 reps - 30 sec hold Seated Levator Scapulae Stretch - 1 x daily - 7 x weekly - 3 reps - 30 sec hold  Patient Education TENS Unit TENS Therapy Trigger Point Dry Needling

## 2021-12-16 ENCOUNTER — Encounter: Payer: Self-pay | Admitting: Physical Therapy

## 2021-12-16 ENCOUNTER — Other Ambulatory Visit: Payer: Self-pay

## 2021-12-16 ENCOUNTER — Ambulatory Visit: Payer: Medicare Other | Attending: Orthopaedic Surgery | Admitting: Physical Therapy

## 2021-12-16 DIAGNOSIS — M79622 Pain in left upper arm: Secondary | ICD-10-CM | POA: Insufficient documentation

## 2021-12-16 DIAGNOSIS — R293 Abnormal posture: Secondary | ICD-10-CM | POA: Diagnosis present

## 2021-12-16 DIAGNOSIS — M25612 Stiffness of left shoulder, not elsewhere classified: Secondary | ICD-10-CM | POA: Diagnosis present

## 2021-12-16 DIAGNOSIS — M6281 Muscle weakness (generalized): Secondary | ICD-10-CM | POA: Insufficient documentation

## 2021-12-16 NOTE — Therapy (Addendum)
[facility] [address]  [place], [place], [address] Phone: [phone]   Fax:  [phone]  Physical Therapy Treatment / Progress Note / Discharge Summary  Patient Details  Name: Caitlin Odonnell MRN: [id number] Date of Birth: 1956/04/09 Referring Provider (PT): Vanetta Mulders, MD  Progress Note  Reporting Period 11/27/2021 to 12/16/2021  See note below for Objective Data and Assessment of Progress/Goals.     Encounter Date: 12/16/2021   PT End of Session - 12/16/21 0803     Visit Number 11    Number of Visits 25    Date for PT Re-Evaluation 01/22/22    Authorization Type [employer] Medicare    Progress Note Due on Visit 21   MD PN on visit #11 - 12/16/21   PT Start Time 0803    PT Stop Time 0842    PT Time Calculation (min) 39 min    Activity Tolerance Patient tolerated treatment well;Patient limited by pain    Behavior During Therapy WFL for tasks assessed/performed             Past Medical History:  Diagnosis Date   Diabetes mellitus without complication ([place])    Hypertension     Past Surgical History:  Procedure Laterality Date   CATARACT EXTRACTION W/ INTRAOCULAR LENS IMPLANT      There were no vitals filed for this visit.   Subjective Assessment - 12/16/21 0807     Subjective Pt reports her arm feels about the same - bones still feel like they are scrubbing and it still feels as if there is a tight band around her arm.    Pertinent History Closed displaced comminuted fracture of shaft of left humerus 07/25/21    Diagnostic tests 09/22/21 - L humerus x-ray: Healing midshaft humeral fracture in unchanged alignment. Interval peripheral callus formation.    Patient Stated Goals "to get my arm working like normal"    Currently in Pain? Yes    Pain Score 7    typically no pain at rest, but pain up to 7/10 with activity   Pain Location Arm    Pain Orientation Left    Pain Descriptors / Indicators  Aching;Pressure;Tightness   "bones scrubbing"   Pain Type Chronic pain    Pain Frequency Intermittent                [facility] PT Assessment - 12/16/21 0803       Assessment   Medical Diagnosis L midshaft humerus fracture    Referring Provider (PT) Vanetta Mulders, MD    Onset Date/Surgical Date 07/25/21    Next MD Visit 12/21/21      AROM   Left Shoulder Flexion 70 Degrees    Left Shoulder ABduction 35 Degrees    Left Shoulder External Rotation 38 Degrees      PROM   Left Shoulder Flexion 115 Degrees    Left Shoulder ABduction 98 Degrees    Left Shoulder External Rotation 62 Degrees                           [facility] Adult PT Treatment/Exercise - 12/16/21 0803       Exercises   Exercises Shoulder      Shoulder Exercises: Pulleys   Flexion 3 minutes    Scaption 3 minutes      Manual Therapy   Manual Therapy Joint mobilization;Soft tissue mobilization;Myofascial release;Passive ROM    Manual  therapy comments skilled palpation and monitoring of soft tissue during DN    Joint Mobilization grade II-II caudal glide/distraction with straight arm ER in sitting    Soft tissue mobilization STM/DTM to L deltoids, biceps, brachialis and triceps    Myofascial Release manual TPR to L  anterior, lateral and posterior deltoid, biceps, brachialis and triceps    Passive ROM L shoulder - all planes to tolerance, GH joint distraction with hold              Trigger Point Dry Needling - 12/16/21 0803     Consent Given? Yes    Education Handout Provided Previously provided    Muscles Treated Upper Quadrant Deltoid;Biceps    Dry Needling Comments Left    Deltoid Response Twitch response elicited;Palpable increased muscle length    Biceps Response Twitch response elicited;Palpable increased muscle length                     PT Short Term Goals - 10/12/21 0937       PT SHORT TERM GOAL #1   Title Patient will be independent with initial HEP    Status  Achieved   10/12/21              PT Long Term Goals - 12/16/21 0840       PT LONG TERM GOAL #1   Title Patient will be independent with ongoing/advanced HEP for self-management at home in order to build upon functional gains in therapy    Status On-going    Target Date 01/22/22      PT LONG TERM GOAL #2   Title Improve posture and alignment with patient to demonstrate improved upright posture with posterior shoulder girdle engaged    Status On-going   12/16/21 - Pt continues to demonstrate fwd shoulder posture and scapular elevation at rest.   Target Date 01/22/22      PT LONG TERM GOAL #3   Title Decrease pain in the L proximal UE by 50% allowing patient to use L UE for functional activities    Status On-going   pt does not report any improvement in arm pain   Target Date 01/22/22      PT LONG TERM GOAL #4   Title Patient to improve L shoulder AROM to [facility] without pain provocation    Status On-going   12/16/21 - AROM remains limited due to pain for all motions   Target Date 01/22/22      PT LONG TERM GOAL #5   Title Patient to report ability to perform ADLs, household, and work-related tasks without limitation due to L UE/shoulder pain, LOM or weakness    Status On-going   pt reports difficulty with lifting weighted objects, OH reaching   Target Date 01/22/22                   Plan - 12/16/21 0842     Clinical Impression Statement Damien continues to struggle with L shoulder ROM and functional use of L UE due to continued pain with all motions (although no pain at rest except when sleeping), scrubbing sensation at fracture site as well as increased muscle tension/tightness in upper arm and shoulder with pt describing sensation that her upper arm is constantly under pressure as if in a permanently inflated BP cuff. Abnormal muscle tension/tightness and guarding result in persistently elevated and forward rounded shoulder which prevents normal GH mechanics and likely  results in increased pain and limited  ROM due to RTC impingement. She continues to struggle with scapular and RTC muscle activation and only limited reduction in muscle tension achievable with MT. Clearance received from MD to try DN to address abnormal muscle tension and pain. After explanation of DN rational, procedures, outcomes and potential side effects, patient verbalized consent to DN treatment in conjunction with manual STM/DTM and TPR to reduce ttp/muscle tension. Muscles treated include L deltoids and biceps. DN produced normal response with good twitches elicited resulting in palpable reduction in muscle tension but no immediate change in pain with movement or A/PROM tolerance. Pt educated that full benefit from DN may take up to 1-2 days to occur and to expect mild to moderate muscle soreness during this time, as well as the potential need for further DN to address ongoing tension in treated muscle as well as additional muscles. Pt instructed to continue prescribed home exercise program and current activity level per pain tolerance with pt verbalizing understanding of theses instructions. If no significant change noted from DN, pt appears to be reaching a plateau with progress with PT.    Comorbidities DM, HTN    Rehab Potential Good    PT Frequency 2x / week    PT Duration 8 weeks    PT Treatment/Interventions ADLs/Self Care Home Management;Cryotherapy;Electrical Stimulation;Iontophoresis 33m/ml Dexamethasone;Moist Heat;Therapeutic exercise;Therapeutic activities;Neuromuscular re-education;Patient/family education;Manual techniques;Passive range of motion;Vasopneumatic Device;Joint Manipulations;Dry needling;Taping    PT Next Visit Plan assess response to DN to L deltoids and biceps and consider further DN to L shoulder complex including L UT and periscapular muscles; scapular strengthening/stabilization; L shoulder P/AA/AROM per pt tolerance - Swiffer as UE ranger; modalities PRN for pain    PT  Home Exercise Plan Access Code: [email or web address](11/29, updated 12/20 & 1/13)    Consulted and Agree with Plan of Care Patient             Patient will benefit from skilled therapeutic intervention in order to improve the following deficits and impairments:  Decreased activity tolerance, Decreased knowledge of precautions, Decreased mobility, Decreased range of motion, Decreased strength, Increased edema, Impaired perceived functional ability, Impaired UE functional use, Improper body mechanics, Postural dysfunction, Pain  Visit Diagnosis: Pain of left upper arm  Stiffness of left shoulder, not elsewhere classified  Muscle weakness (generalized)  Abnormal posture     Problem List There are no problems to display for this patient.   JPercival Spanish PT 12/16/2021, 11:16 AM  [facility] [place] [address]Phone: [phone]  Fax:  [phone] Name: MAlaynah SchutterMRN: [id number] of Birth: 1Oct 04, 1957  PHYSICAL THERAPY DISCHARGE SUMMARY  Visits from Start of Care: 11  Current functional level related to goals / functional outcomes:   Refer to above clinical impression for status as of last visit on 12/16/2021. 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   Remaining deficits:  As above. 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 Will be scheduled for new eval post-op.   Education / Equipment:   HEP   Patient agrees to discharge. Patient goals were not met. Patient is being discharged due to a change in medical status.  JPercival Spanish PT, MPT 12/28/21, 2:54 PM  [facility] [address]  [place], [place], [address] Phone: [phone]   Fax:  [phone]

## 2021-12-21 ENCOUNTER — Ambulatory Visit (HOSPITAL_BASED_OUTPATIENT_CLINIC_OR_DEPARTMENT_OTHER): Payer: Medicare Other | Admitting: Orthopaedic Surgery

## 2021-12-21 ENCOUNTER — Other Ambulatory Visit (HOSPITAL_BASED_OUTPATIENT_CLINIC_OR_DEPARTMENT_OTHER): Payer: Self-pay | Admitting: Orthopaedic Surgery

## 2021-12-21 ENCOUNTER — Ambulatory Visit (HOSPITAL_BASED_OUTPATIENT_CLINIC_OR_DEPARTMENT_OTHER): Payer: Self-pay | Admitting: Orthopaedic Surgery

## 2021-12-21 ENCOUNTER — Other Ambulatory Visit: Payer: Self-pay

## 2021-12-21 ENCOUNTER — Other Ambulatory Visit (HOSPITAL_BASED_OUTPATIENT_CLINIC_OR_DEPARTMENT_OTHER): Payer: Self-pay

## 2021-12-21 ENCOUNTER — Ambulatory Visit (HOSPITAL_BASED_OUTPATIENT_CLINIC_OR_DEPARTMENT_OTHER)
Admission: RE | Admit: 2021-12-21 | Discharge: 2021-12-21 | Disposition: A | Discharge status: Home / Self Care | Payer: Medicare Other | Source: Ambulatory Visit | Attending: Orthopaedic Surgery | Admitting: Orthopaedic Surgery

## 2021-12-21 DIAGNOSIS — S42352A Displaced comminuted fracture of shaft of humerus, left arm, initial encounter for closed fracture: Secondary | ICD-10-CM

## 2021-12-21 MED ORDER — ACETAMINOPHEN 500 MG PO TABS
500.0000 mg | ORAL_TABLET | Freq: Three times a day (TID) | ORAL | 0 refills | Status: AC
  Filled 2021-12-21: qty 30, 10d supply, fill #0

## 2021-12-21 MED ORDER — OXYCODONE HCL 5 MG PO TABS
5.0000 mg | ORAL_TABLET | ORAL | 0 refills | Status: DC | PRN
  Filled 2021-12-21: qty 20, 4d supply, fill #0

## 2021-12-21 MED ORDER — IBUPROFEN 800 MG PO TABS
800.0000 mg | ORAL_TABLET | Freq: Three times a day (TID) | ORAL | 0 refills | Status: AC
  Filled 2021-12-21: qty 30, 10d supply, fill #0

## 2021-12-21 MED ORDER — ASPIRIN EC 325 MG PO TBEC
325.0000 mg | DELAYED_RELEASE_TABLET | Freq: Every day | ORAL | 0 refills | Status: DC
  Filled 2021-12-21: qty 30, 30d supply, fill #0

## 2021-12-21 NOTE — Progress Notes (Signed)
Chief Complaint: Left arm pain     History of Present Illness:   12/21/2021: Caitlin Odonnell presents today for follow-up of her left humeral fracture.  Unfortunately at this time she believes that she is continuing to have fracture motion.  She is very limited in terms of her motion.  Caitlin Odonnell is a 66 y.o. 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  She states that she felt a break and subsequently proceeded to the emergency room.  She denies any history of shoulder or arm pain.  The arm was functioning normal prior to this.  She was placed in a sugar-tong splint in the emergency room and given a sling.  She denies any numbness or weakness in the left hand.  She does endorse some swelling and pain about the left shoulder.  She is sleeping in her recliner to get comfortable   Surgical History:   None  PMH/PSH/Family History/Social History/Meds/Allergies:    Past Medical History:  Diagnosis Date   Diabetes mellitus without complication (HCC)    Hypertension    Past Surgical History:  Procedure Laterality Date   CATARACT EXTRACTION W/ INTRAOCULAR LENS IMPLANT     Social History   Socioeconomic History   Marital status: Widowed    Spouse name: Not on file   Number of children: Not on file   Years of education: Not on file   Highest education level: Not on file  Occupational History   Not on file  Tobacco Use   Smoking status: Not on file   Smokeless tobacco: Not on file  Substance and Sexual Activity   Alcohol use: Not on file   Drug use: Not on file   Sexual activity: Not on file  Other Topics Concern   Not on file  Social History Narrative   Not on file   Social Determinants of Health   Financial Resource Strain: Not on file  Food Insecurity: Not on file  Transportation Needs: Not on file  Physical Activity: Not on file  Stress: Not  on file  Social Connections: Not on file   No family history on file. Allergies  Allergen Reactions   Cortisone Other (See Comments)    Had some numbness in her face. States it happened after a cortisone injection in her knee, had never had reaction to it prior to this incident.    Current Outpatient Medications  Medication Sig Dispense Refill   acetaminophen (TYLENOL) 500 MG tablet Take 1 tablet (500 mg total) by mouth every 8 (eight) hours for 10 days. 30 tablet 0   aspirin EC 325 MG tablet Take 1 tablet (325 mg total) by mouth daily. 30 tablet 0   ibuprofen (ADVIL) 800 MG tablet Take 1 tablet (800 mg total) by mouth every 8 (eight) hours for 10 days. Please take with food, please alternate with acetaminophen 30 tablet 0   oxyCODONE (OXY IR/ROXICODONE) 5 MG immediate release tablet Take 1 tablet (5 mg total) by mouth every 4 (four) hours as needed (severe pain). 20 tablet 0   methocarbamol (ROBAXIN) 500 MG tablet Take 1 tablet (500 mg total) by mouth 2 (two) times daily. (Patient not taking: Reported on 09/29/2021) 20 tablet 0  methocarbamol (ROBAXIN) 500 MG tablet Take 1 tablet (500 mg total) by mouth 4 (four) times daily. (Patient not taking: Reported on 09/29/2021) 30 tablet 2   oxyCODONE (OXY IR/ROXICODONE) 5 MG immediate release tablet Take 1 tablet (5 mg total) by mouth every 4 (four) hours as needed (severe pain). (Patient not taking: Reported on 09/29/2021) 20 tablet 0   oxyCODONE-acetaminophen (PERCOCET/ROXICET) 5-325 MG tablet Take 1 tablet by mouth every 6 (six) hours as needed for severe pain. (Patient not taking: Reported on 09/29/2021) 15 tablet 0   No current facility-administered medications for this visit.   No results found.  Review of Systems:   A ROS was performed including pertinent positives and negatives as documented in the HPI.  Physical Exam :   Constitutional: NAD and appears stated age Neurological: Alert and oriented Psych: Appropriate affect and  cooperative There were no vitals taken for this visit.   Comprehensive Musculoskeletal Exam:    There is no motion about the fracture site with abduction of the left arm.  There is swelling about the arm.  She is able to extend at the wrist as well as fire the EPL of the left thumb.  Sensation is intact light touch in all distributions of the left hand.  2+ radial pulse.  Range of motion is 20 degrees of active abduction  Imaging:    X-rays 2 views left humerus: There is callus formation although this this appears to have regressed since previous films and there is now interval increased angulation  I personally reviewed and interpreted the radiographs.   Assessment:   66 year old female with a left midshaft humeral fracture with proximal extension.  Unfortunately x-rays today show lack of significant callus formation and no increased angulation.  Her range of motion continues to be extremely limited and she is having quite a difficult time with range of motion.  I discussed that unfortunately I think she is highly likely to be going on to a nonunion.  At this time I would recommend surgical repair with open reduction internal fixation and plating.  I did discuss the morbidity of this procedure and the complications including infection and nerve injury.  I did discuss that she is at high risk for infection due to her diabetes although this was likely what increases her risk for nonunion in the first place.  After extensive discussion we have decided to proceed with open reduction internal fixation of left humerus  Plan :    -Plan for left humerus open reduction internal fixation    After a lengthy discussion of treatment options, including risks, benefits, alternatives, complications of surgical and nonsurgical conservative options, the patient elected surgical repair.   The patient  is aware of the material risks  and complications including, but not limited to injury to adjacent  structures, neurovascular injury, infection, numbness, bleeding, implant failure, thermal burns, stiffness, persistent pain, failure to heal, disease transmission from allograft, need for further surgery, dislocation, anesthetic risks, blood clots, risks of death,and others. The probabilities of surgical success and failure discussed with patient given their particular co-morbidities.The time and nature of expected rehabilitation and recovery was discussed.The patient's questions were all answered preoperatively.  No barriers to understanding were noted. I explained the natural history of the disease process and Rx rationale.  I explained to the patient what I considered to be reasonable expectations given their personal situation.  The final treatment plan was arrived at through a shared patient decision making process model.  I personally saw and evaluated the patient, and participated in the management and treatment plan.  Vanetta Mulders, MD Attending Physician, Orthopedic Surgery  This document was dictated using Dragon voice recognition software. A reasonable attempt at proof reading has been made to minimize errors.

## 2021-12-21 NOTE — H&P (View-Only) (Signed)
° °                            ° ° °Chief Complaint: Left arm pain °  ° ° °History of Present Illness:  ° °12/21/2021: Caitlin Odonnell presents today for follow-up of her left humeral fracture.  Unfortunately at this time she believes that she is continuing to have fracture motion.  She is very limited in terms of her motion. ° °Caitlin Odonnell is a 65 y.o. 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  She states that she felt a break and subsequently proceeded to the emergency room.  She denies any history of shoulder or arm pain.  The arm was functioning normal prior to this.  She was placed in a sugar-tong splint in the emergency room and given a sling.  She denies any numbness or weakness in the left hand.  She does endorse some swelling and pain about the left shoulder.  She is sleeping in her recliner to get comfortable ° ° °Surgical History:   °None ° °PMH/PSH/Family History/Social History/Meds/Allergies:   ° °Past Medical History:  °Diagnosis Date  ° Diabetes mellitus without complication (HCC)   ° Hypertension   ° °Past Surgical History:  °Procedure Laterality Date  ° CATARACT EXTRACTION W/ INTRAOCULAR LENS IMPLANT    ° °Social History  ° °Socioeconomic History  ° Marital status: Widowed  °  Spouse name: Not on file  ° Number of children: Not on file  ° Years of education: Not on file  ° Highest education level: Not on file  °Occupational History  ° Not on file  °Tobacco Use  ° Smoking status: Not on file  ° Smokeless tobacco: Not on file  °Substance and Sexual Activity  ° Alcohol use: Not on file  ° Drug use: Not on file  ° Sexual activity: Not on file  °Other Topics Concern  ° Not on file  °Social History Narrative  ° Not on file  ° °Social Determinants of Health  ° °Financial Resource Strain: Not on file  °Food Insecurity: Not on file  °Transportation Needs: Not on file  °Physical Activity: Not on file  °Stress: Not  on file  °Social Connections: Not on file  ° °No family history on file. °Allergies  °Allergen Reactions  ° Cortisone Other (See Comments)  °  Had some numbness in her face. States it happened after a cortisone injection in her knee, had never had reaction to it prior to this incident.   ° °Current Outpatient Medications  °Medication Sig Dispense Refill  ° acetaminophen (TYLENOL) 500 MG tablet Take 1 tablet (500 mg total) by mouth every 8 (eight) hours for 10 days. 30 tablet 0  ° aspirin EC 325 MG tablet Take 1 tablet (325 mg total) by mouth daily. 30 tablet 0  ° ibuprofen (ADVIL) 800 MG tablet Take 1 tablet (800 mg total) by mouth every 8 (eight) hours for 10 days. Please take with food, please alternate with acetaminophen 30 tablet 0  ° oxyCODONE (OXY IR/ROXICODONE) 5 MG immediate release tablet Take 1 tablet (5 mg total) by mouth every 4 (four) hours as needed (severe pain). 20 tablet 0  ° methocarbamol (ROBAXIN) 500 MG tablet Take 1 tablet (500 mg total) by mouth 2 (two) times daily. (Patient not taking: Reported on 09/29/2021) 20 tablet 0  °   methocarbamol (ROBAXIN) 500 MG tablet Take 1 tablet (500 mg total) by mouth 4 (four) times daily. (Patient not taking: Reported on 09/29/2021) 30 tablet 2   oxyCODONE (OXY IR/ROXICODONE) 5 MG immediate release tablet Take 1 tablet (5 mg total) by mouth every 4 (four) hours as needed (severe pain). (Patient not taking: Reported on 09/29/2021) 20 tablet 0   oxyCODONE-acetaminophen (PERCOCET/ROXICET) 5-325 MG tablet Take 1 tablet by mouth every 6 (six) hours as needed for severe pain. (Patient not taking: Reported on 09/29/2021) 15 tablet 0   No current facility-administered medications for this visit.   No results found.  Review of Systems:   A ROS was performed including pertinent positives and negatives as documented in the HPI.  Physical Exam :   Constitutional: NAD and appears stated age Neurological: Alert and oriented Psych: Appropriate affect and  cooperative There were no vitals taken for this visit.   Comprehensive Musculoskeletal Exam:    There is no motion about the fracture site with abduction of the left arm.  There is swelling about the arm.  She is able to extend at the wrist as well as fire the EPL of the left thumb.  Sensation is intact light touch in all distributions of the left hand.  2+ radial pulse.  Range of motion is 20 degrees of active abduction  Imaging:    X-rays 2 views left humerus: There is callus formation although this this appears to have regressed since previous films and there is now interval increased angulation  I personally reviewed and interpreted the radiographs.   Assessment:   66 year old female with a left midshaft humeral fracture with proximal extension.  Unfortunately x-rays today show lack of significant callus formation and no increased angulation.  Her range of motion continues to be extremely limited and she is having quite a difficult time with range of motion.  I discussed that unfortunately I think she is highly likely to be going on to a nonunion.  At this time I would recommend surgical repair with open reduction internal fixation and plating.  I did discuss the morbidity of this procedure and the complications including infection and nerve injury.  I did discuss that she is at high risk for infection due to her diabetes although this was likely what increases her risk for nonunion in the first place.  After extensive discussion we have decided to proceed with open reduction internal fixation of left humerus  Plan :    -Plan for left humerus open reduction internal fixation    After a lengthy discussion of treatment options, including risks, benefits, alternatives, complications of surgical and nonsurgical conservative options, the patient elected surgical repair.   The patient  is aware of the material risks  and complications including, but not limited to injury to adjacent  structures, neurovascular injury, infection, numbness, bleeding, implant failure, thermal burns, stiffness, persistent pain, failure to heal, disease transmission from allograft, need for further surgery, dislocation, anesthetic risks, blood clots, risks of death,and others. The probabilities of surgical success and failure discussed with patient given their particular co-morbidities.The time and nature of expected rehabilitation and recovery was discussed.The patient's questions were all answered preoperatively.  No barriers to understanding were noted. I explained the natural history of the disease process and Rx rationale.  I explained to the patient what I considered to be reasonable expectations given their personal situation.  The final treatment plan was arrived at through a shared patient decision making process model.  I personally saw and evaluated the patient, and participated in the management and treatment plan.  Vanetta Mulders, MD Attending Physician, Orthopedic Surgery  This document was dictated using Dragon voice recognition software. A reasonable attempt at proof reading has been made to minimize errors.

## 2021-12-23 ENCOUNTER — Other Ambulatory Visit: Payer: Self-pay

## 2021-12-23 ENCOUNTER — Encounter (HOSPITAL_BASED_OUTPATIENT_CLINIC_OR_DEPARTMENT_OTHER): Payer: Self-pay | Admitting: Orthopaedic Surgery

## 2021-12-25 ENCOUNTER — Other Ambulatory Visit: Payer: Self-pay

## 2021-12-25 ENCOUNTER — Encounter: Payer: Medicare Other | Admitting: Physical Therapy

## 2021-12-25 ENCOUNTER — Encounter (HOSPITAL_COMMUNITY): Payer: Self-pay | Admitting: Orthopaedic Surgery

## 2021-12-25 MED ORDER — LACTATED RINGERS IV SOLN: INTRAVENOUS | Status: DC

## 2021-12-25 NOTE — Progress Notes (Signed)
Ms Caitlin Odonnell denies chest pain or shortness of breath.  Patient denies having any s/s of Covid in her household.  Patient denies any known exposure to Covid.   Ms Caitlin Odonnell has type II diabetes, patient does not check CBG, she does not have a CBG machine. Ms Caitlin Odonnell's last A1C was 6.6 drawn on 11/17/21. I instructed Ms Caitlin Odonnell to not take any medications for diabetes on the morning of surgery.  I instructed Ms Caitlin Odonnell to stop Ibuprofen, herbal and vitamins. Ms Caitlin Odonnell has not started ASA, it is to prevent DVT/ PE post op.  Ms. Caitlin Odonnell's PCP is Jacquelynn Cree, FNP with [facility] in [place] , [place].  I have requested EKG that was done 07/21/21.  I instructed Ms Caitlin Odonnell to shower with antibiotic soap, if it is available.  Dry off with a clean towel. Do not put lotion, powder, cologne or deodorant or makeup.No jewelry or piercings. Men may shave their face and neck. Woman should not shave. No nail polish, artificial or acrylic nails. Wear clean clothes, brush your teeth. Glasses, contact lens,dentures or partials may not be worn in the OR. If you need to wear them, please bring a case for glasses, do not wear contacts or bring a case, the hospital does not have contact cases, dentures or partials will have to be removed , make sure they are clean, we will provide a denture cup to put them in. You will need some one to drive you home and a responsible person over the age of 4 to stay with you for the first 24 hours after surgery.

## 2021-12-28 ENCOUNTER — Ambulatory Visit (HOSPITAL_COMMUNITY): Payer: Medicare Other

## 2021-12-28 ENCOUNTER — Encounter (HOSPITAL_COMMUNITY): Payer: Self-pay | Admitting: Orthopaedic Surgery

## 2021-12-28 ENCOUNTER — Encounter (HOSPITAL_COMMUNITY)
Admission: RE | Disposition: A | Discharge status: Home / Self Care | Payer: Self-pay | Source: Home / Self Care | Attending: Orthopaedic Surgery

## 2021-12-28 ENCOUNTER — Ambulatory Visit (HOSPITAL_COMMUNITY): Payer: Medicare Other | Admitting: Anesthesiology

## 2021-12-28 ENCOUNTER — Ambulatory Visit (HOSPITAL_BASED_OUTPATIENT_CLINIC_OR_DEPARTMENT_OTHER): Payer: Self-pay | Admitting: Orthopaedic Surgery

## 2021-12-28 ENCOUNTER — Ambulatory Visit (HOSPITAL_BASED_OUTPATIENT_CLINIC_OR_DEPARTMENT_OTHER): Payer: Medicare Other | Admitting: Anesthesiology

## 2021-12-28 ENCOUNTER — Other Ambulatory Visit: Payer: Self-pay

## 2021-12-28 ENCOUNTER — Observation Stay (HOSPITAL_COMMUNITY)
Admission: RE | Admit: 2021-12-28 | Discharge: 2021-12-29 | Disposition: A | Discharge status: Home / Self Care | Payer: Medicare Other | Attending: Orthopaedic Surgery | Admitting: Orthopaedic Surgery

## 2021-12-28 DIAGNOSIS — I10 Essential (primary) hypertension: Secondary | ICD-10-CM | POA: Insufficient documentation

## 2021-12-28 DIAGNOSIS — E119 Type 2 diabetes mellitus without complications: Secondary | ICD-10-CM | POA: Insufficient documentation

## 2021-12-28 DIAGNOSIS — S42302K Unspecified fracture of shaft of humerus, left arm, subsequent encounter for fracture with nonunion: Secondary | ICD-10-CM

## 2021-12-28 DIAGNOSIS — Z7984 Long term (current) use of oral hypoglycemic drugs: Secondary | ICD-10-CM | POA: Insufficient documentation

## 2021-12-28 DIAGNOSIS — S42309A Unspecified fracture of shaft of humerus, unspecified arm, initial encounter for closed fracture: Secondary | ICD-10-CM | POA: Diagnosis present

## 2021-12-28 DIAGNOSIS — Z419 Encounter for procedure for purposes other than remedying health state, unspecified: Secondary | ICD-10-CM

## 2021-12-28 DIAGNOSIS — X58XXXA Exposure to other specified factors, initial encounter: Secondary | ICD-10-CM | POA: Insufficient documentation

## 2021-12-28 DIAGNOSIS — S42352K Displaced comminuted fracture of shaft of humerus, left arm, subsequent encounter for fracture with nonunion: Principal | ICD-10-CM | POA: Insufficient documentation

## 2021-12-28 DIAGNOSIS — Z79899 Other long term (current) drug therapy: Secondary | ICD-10-CM

## 2021-12-28 DIAGNOSIS — S42352A Displaced comminuted fracture of shaft of humerus, left arm, initial encounter for closed fracture: Secondary | ICD-10-CM

## 2021-12-28 DIAGNOSIS — Z7982 Long term (current) use of aspirin: Secondary | ICD-10-CM | POA: Diagnosis not present

## 2021-12-28 HISTORY — DX: Other complications of anesthesia, initial encounter: T88.59XA

## 2021-12-28 HISTORY — DX: Unspecified osteoarthritis, unspecified site: M19.90

## 2021-12-28 HISTORY — PX: ORIF HUMERUS FRACTURE: SHX2126

## 2021-12-28 HISTORY — DX: Polyneuropathy, unspecified: G62.9

## 2021-12-28 LAB — CBC
HCT: 43.1 % (ref 36.0–46.0)
Hemoglobin: 13.7 g/dL (ref 12.0–15.0)
MCH: 30.2 pg (ref 26.0–34.0)
MCHC: 31.8 g/dL (ref 30.0–36.0)
MCV: 95.1 fL (ref 80.0–100.0)
Platelets: 343 10*3/uL (ref 150–400)
RBC: 4.53 MIL/uL (ref 3.87–5.11)
RDW: 13.1 % (ref 11.5–15.5)
WBC: 9.5 10*3/uL (ref 4.0–10.5)
nRBC: 0 % (ref 0.0–0.2)

## 2021-12-28 LAB — BASIC METABOLIC PANEL
Anion gap: 10 (ref 5–15)
BUN: 13 mg/dL (ref 8–23)
CO2: 25 mmol/L (ref 22–32)
Calcium: 9.3 mg/dL (ref 8.9–10.3)
Chloride: 102 mmol/L (ref 98–111)
Creatinine, Ser: 0.89 mg/dL (ref 0.44–1.00)
GFR, Estimated: >60 mL/min (ref >60)
Glucose, Bld: 123 mg/dL — ABNORMAL HIGH (ref 70–99)
Potassium: 4 mmol/L (ref 3.5–5.1)
Sodium: 137 mmol/L (ref 135–145)

## 2021-12-28 LAB — GLUCOSE, CAPILLARY
Glucose-Capillary: 128 mg/dL — ABNORMAL HIGH (ref 70–99)
Glucose-Capillary: 128 mg/dL — ABNORMAL HIGH (ref 70–99)
Glucose-Capillary: 156 mg/dL — ABNORMAL HIGH (ref 70–99)
Glucose-Capillary: 169 mg/dL — ABNORMAL HIGH (ref 70–99)

## 2021-12-28 SURGERY — OPEN REDUCTION INTERNAL FIXATION (ORIF) HUMERAL SHAFT FRACTURE
Anesthesia: Regional | Site: Arm Upper | Laterality: Left

## 2021-12-28 MED ORDER — PHENYLEPHRINE HCL-NACL 20-0.9 MG/250ML-% IV SOLN
INTRAVENOUS | Status: DC | PRN
  Administered 2021-12-28: 10 ug/min via INTRAVENOUS

## 2021-12-28 MED ORDER — ORAL CARE MOUTH RINSE: 15.0000 mL | Freq: Once | OROMUCOSAL | Status: AC

## 2021-12-28 MED ORDER — FENTANYL CITRATE (PF) 100 MCG/2ML IJ SOLN
25.0000 ug | INTRAMUSCULAR | Status: DC | PRN
  Administered 2021-12-28 (×2): 25 ug via INTRAVENOUS

## 2021-12-28 MED ORDER — FENTANYL CITRATE (PF) 100 MCG/2ML IJ SOLN: 100.0000 ug | Freq: Once | INTRAMUSCULAR | Status: AC

## 2021-12-28 MED ORDER — ROCURONIUM BROMIDE 10 MG/ML (PF) SYRINGE
PREFILLED_SYRINGE | INTRAVENOUS | Status: DC | PRN
Start: 2021-12-28 — End: 2021-12-28
  Administered 2021-12-28: 50 mg via INTRAVENOUS
  Administered 2021-12-28: 20 mg via INTRAVENOUS
  Administered 2021-12-28: 30 mg via INTRAVENOUS

## 2021-12-28 MED ORDER — CHLORHEXIDINE GLUCONATE 0.12 % MT SOLN
15.0000 mL | Freq: Once | OROMUCOSAL | Status: AC
  Administered 2021-12-28: 15 mL via OROMUCOSAL
  Filled 2021-12-28: qty 15

## 2021-12-28 MED ORDER — CEFAZOLIN IN SODIUM CHLORIDE 3-0.9 GM/100ML-% IV SOLN
3.0000 g | INTRAVENOUS | Status: AC
  Administered 2021-12-28: 3 g via INTRAVENOUS

## 2021-12-28 MED ORDER — SODIUM CHLORIDE 0.9 % IR SOLN
Status: DC | PRN
Start: 2021-12-28 — End: 2021-12-28
  Administered 2021-12-28: 1000 mL

## 2021-12-28 MED ORDER — ONDANSETRON HCL 4 MG/2ML IJ SOLN: 4.0000 mg | Freq: Four times a day (QID) | INTRAMUSCULAR | Status: DC | PRN

## 2021-12-28 MED ORDER — OXYCODONE HCL 5 MG PO TABS
10.0000 mg | ORAL_TABLET | ORAL | Status: DC | PRN
  Administered 2021-12-29: 10 mg via ORAL
  Filled 2021-12-28: qty 2

## 2021-12-28 MED ORDER — SUGAMMADEX SODIUM 200 MG/2ML IV SOLN
INTRAVENOUS | Status: DC | PRN
  Administered 2021-12-28 (×2): 100 mg via INTRAVENOUS

## 2021-12-28 MED ORDER — SIMVASTATIN 5 MG PO TABS
5.0000 mg | ORAL_TABLET | Freq: Every day | ORAL | Status: DC
Start: 2021-12-28 — End: 2021-12-29
  Administered 2021-12-28: 5 mg via ORAL
  Filled 2021-12-28 (×2): qty 1

## 2021-12-28 MED ORDER — LISINOPRIL 20 MG PO TABS
20.0000 mg | ORAL_TABLET | Freq: Every day | ORAL | Status: DC
Start: 2021-12-29 — End: 2021-12-29
  Administered 2021-12-29: 20 mg via ORAL
  Filled 2021-12-28: qty 1

## 2021-12-28 MED ORDER — OXYCODONE HCL 5 MG PO TABS: 5.0000 mg | ORAL_TABLET | ORAL | Status: DC | PRN

## 2021-12-28 MED ORDER — LISINOPRIL-HYDROCHLOROTHIAZIDE 20-25 MG PO TABS: 1.0000 | ORAL_TABLET | Freq: Every day | ORAL | Status: DC

## 2021-12-28 MED ORDER — ROCURONIUM BROMIDE 10 MG/ML (PF) SYRINGE
PREFILLED_SYRINGE | INTRAVENOUS | Status: AC
  Filled 2021-12-28: qty 10

## 2021-12-28 MED ORDER — SUCCINYLCHOLINE CHLORIDE 200 MG/10ML IV SOSY
PREFILLED_SYRINGE | INTRAVENOUS | Status: AC
  Filled 2021-12-28: qty 10

## 2021-12-28 MED ORDER — ONDANSETRON HCL 4 MG/2ML IJ SOLN
INTRAMUSCULAR | Status: AC
  Filled 2021-12-28: qty 2

## 2021-12-28 MED ORDER — DOCUSATE SODIUM 100 MG PO CAPS
100.0000 mg | ORAL_CAPSULE | Freq: Two times a day (BID) | ORAL | Status: DC
  Administered 2021-12-28 – 2021-12-29 (×2): 100 mg via ORAL
  Filled 2021-12-28 (×2): qty 1

## 2021-12-28 MED ORDER — ONDANSETRON HCL 4 MG/2ML IJ SOLN
INTRAMUSCULAR | Status: DC | PRN
Start: 2021-12-28 — End: 2021-12-28
  Administered 2021-12-28: 4 mg via INTRAVENOUS

## 2021-12-28 MED ORDER — HYDROCHLOROTHIAZIDE 25 MG PO TABS
25.0000 mg | ORAL_TABLET | Freq: Every day | ORAL | Status: DC
Start: 2021-12-29 — End: 2021-12-29
  Administered 2021-12-29: 25 mg via ORAL
  Filled 2021-12-28: qty 1

## 2021-12-28 MED ORDER — FENTANYL CITRATE (PF) 250 MCG/5ML IJ SOLN
INTRAMUSCULAR | Status: AC
  Filled 2021-12-28: qty 5

## 2021-12-28 MED ORDER — VANCOMYCIN HCL 1000 MG IV SOLR
INTRAVENOUS | Status: AC
  Filled 2021-12-28: qty 20

## 2021-12-28 MED ORDER — CEFAZOLIN IN SODIUM CHLORIDE 3-0.9 GM/100ML-% IV SOLN
INTRAVENOUS | Status: AC
  Filled 2021-12-28: qty 100

## 2021-12-28 MED ORDER — VANCOMYCIN HCL 1000 MG IV SOLR
INTRAVENOUS | Status: DC | PRN
Start: 2021-12-28 — End: 2021-12-28
  Administered 2021-12-28: 1000 mg

## 2021-12-28 MED ORDER — LACTATED RINGERS IV SOLN: INTRAVENOUS | Status: DC

## 2021-12-28 MED ORDER — SUCCINYLCHOLINE CHLORIDE 200 MG/10ML IV SOSY
PREFILLED_SYRINGE | INTRAVENOUS | Status: DC | PRN
Start: 2021-12-28 — End: 2021-12-28
  Administered 2021-12-28: 120 mg via INTRAVENOUS

## 2021-12-28 MED ORDER — PHENYLEPHRINE 40 MCG/ML (10ML) SYRINGE FOR IV PUSH (FOR BLOOD PRESSURE SUPPORT): PREFILLED_SYRINGE | INTRAVENOUS | Status: DC | PRN

## 2021-12-28 MED ORDER — 0.9 % SODIUM CHLORIDE (POUR BTL) OPTIME
TOPICAL | Status: DC | PRN
  Administered 2021-12-28: 1000 mL

## 2021-12-28 MED ORDER — MIDAZOLAM HCL 2 MG/2ML IJ SOLN
INTRAMUSCULAR | Status: AC
  Administered 2021-12-28: 1 mg via INTRAVENOUS
  Filled 2021-12-28: qty 2

## 2021-12-28 MED ORDER — TRANEXAMIC ACID-NACL 1000-0.7 MG/100ML-% IV SOLN
1000.0000 mg | INTRAVENOUS | Status: AC
  Administered 2021-12-28: 1000 mg via INTRAVENOUS

## 2021-12-28 MED ORDER — GABAPENTIN 300 MG PO CAPS
300.0000 mg | ORAL_CAPSULE | Freq: Once | ORAL | Status: AC
  Administered 2021-12-28: 300 mg via ORAL

## 2021-12-28 MED ORDER — AMLODIPINE BESYLATE 5 MG PO TABS
5.0000 mg | ORAL_TABLET | Freq: Every day | ORAL | Status: DC
  Administered 2021-12-28: 5 mg via ORAL
  Filled 2021-12-28: qty 1

## 2021-12-28 MED ORDER — SODIUM CHLORIDE 0.9 % IV SOLN: INTRAVENOUS | Status: DC

## 2021-12-28 MED ORDER — TRANEXAMIC ACID-NACL 1000-0.7 MG/100ML-% IV SOLN
INTRAVENOUS | Status: AC
  Filled 2021-12-28: qty 100

## 2021-12-28 MED ORDER — PROPOFOL 10 MG/ML IV BOLUS
INTRAVENOUS | Status: DC | PRN
  Administered 2021-12-28: 100 mg via INTRAVENOUS

## 2021-12-28 MED ORDER — FENTANYL CITRATE (PF) 100 MCG/2ML IJ SOLN
INTRAMUSCULAR | Status: AC
  Administered 2021-12-28: 100 ug via INTRAVENOUS
  Filled 2021-12-28: qty 2

## 2021-12-28 MED ORDER — ACETAMINOPHEN 500 MG PO TABS
1000.0000 mg | ORAL_TABLET | Freq: Once | ORAL | Status: AC
  Administered 2021-12-28: 1000 mg via ORAL
  Filled 2021-12-28: qty 2

## 2021-12-28 MED ORDER — ONDANSETRON HCL 4 MG PO TABS: 4.0000 mg | ORAL_TABLET | Freq: Four times a day (QID) | ORAL | Status: DC | PRN

## 2021-12-28 MED ORDER — FENTANYL CITRATE (PF) 250 MCG/5ML IJ SOLN
INTRAMUSCULAR | Status: DC | PRN
  Administered 2021-12-28: 50 ug via INTRAVENOUS

## 2021-12-28 MED ORDER — FENTANYL CITRATE (PF) 100 MCG/2ML IJ SOLN
INTRAMUSCULAR | Status: AC
  Filled 2021-12-28: qty 2

## 2021-12-28 MED ORDER — LIDOCAINE 2% (20 MG/ML) 5 ML SYRINGE
INTRAMUSCULAR | Status: DC | PRN
Start: 2021-12-28 — End: 2021-12-28
  Administered 2021-12-28: 40 mg via INTRAVENOUS

## 2021-12-28 MED ORDER — MIDAZOLAM HCL 2 MG/2ML IJ SOLN: 1.0000 mg | Freq: Once | INTRAMUSCULAR | Status: AC

## 2021-12-28 MED ORDER — PREGABALIN 25 MG PO CAPS
50.0000 mg | ORAL_CAPSULE | Freq: Every day | ORAL | Status: DC
  Administered 2021-12-28: 50 mg via ORAL
  Filled 2021-12-28: qty 2

## 2021-12-28 MED ORDER — ACETAMINOPHEN 500 MG PO TABS
1000.0000 mg | ORAL_TABLET | Freq: Four times a day (QID) | ORAL | Status: DC
  Administered 2021-12-28 – 2021-12-29 (×3): 1000 mg via ORAL
  Filled 2021-12-28 (×3): qty 2

## 2021-12-28 MED ORDER — METFORMIN HCL 500 MG PO TABS
1000.0000 mg | ORAL_TABLET | Freq: Every day | ORAL | Status: DC
  Administered 2021-12-29: 1000 mg via ORAL
  Filled 2021-12-28: qty 2

## 2021-12-28 MED ORDER — LIDOCAINE 2% (20 MG/ML) 5 ML SYRINGE
INTRAMUSCULAR | Status: AC
  Filled 2021-12-28: qty 5

## 2021-12-28 MED ORDER — BUPIVACAINE LIPOSOME 1.3 % IJ SUSP
INTRAMUSCULAR | Status: DC | PRN
  Administered 2021-12-28: 10 mL via PERINEURAL

## 2021-12-28 MED ORDER — SUGAMMADEX SODIUM 500 MG/5ML IV SOLN
INTRAVENOUS | Status: AC
  Filled 2021-12-28: qty 5

## 2021-12-28 MED ORDER — HYDROMORPHONE HCL 1 MG/ML IJ SOLN: 0.5000 mg | INTRAMUSCULAR | Status: DC | PRN

## 2021-12-28 MED ORDER — GABAPENTIN 300 MG PO CAPS
ORAL_CAPSULE | ORAL | Status: AC
  Filled 2021-12-28: qty 1

## 2021-12-28 MED ORDER — BUPIVACAINE-EPINEPHRINE (PF) 0.5% -1:200000 IJ SOLN
INTRAMUSCULAR | Status: DC | PRN
Start: 2021-12-28 — End: 2021-12-28
  Administered 2021-12-28: 15 mL via PERINEURAL

## 2021-12-28 SURGICAL SUPPLY — 73 items
BAG COUNTER SPONGE SURGICOUNT (BAG) ×2 IMPLANT
BIT DRILL 2.5MM SMALL QC EVOS (BIT) IMPLANT
BIT DRILL QC 2.5MM SHRT EVO SM (DRILL) IMPLANT
BNDG COHESIVE 4X5 TAN ST LF (GAUZE/BANDAGES/DRESSINGS) ×1 IMPLANT
BNDG COHESIVE 4X5 TAN STRL (GAUZE/BANDAGES/DRESSINGS) ×2 IMPLANT
BNDG ESMARK 4X9 LF (GAUZE/BANDAGES/DRESSINGS) ×2 IMPLANT
BNDG GAUZE ELAST 4 BULKY (GAUZE/BANDAGES/DRESSINGS) IMPLANT
BONE CANC CHIPS 20CC PCAN1/4 (Bone Implant) ×2 IMPLANT
CHIPS CANC BONE 20CC PCAN1/4 (Bone Implant) ×1 IMPLANT
CHLORAPREP W/TINT 26 (MISCELLANEOUS) ×2 IMPLANT
COVER SURGICAL LIGHT HANDLE (MISCELLANEOUS) ×4 IMPLANT
CUFF TOURN SGL QUICK 18X4 (TOURNIQUET CUFF) ×2 IMPLANT
CUFF TOURN SGL QUICK 24 (TOURNIQUET CUFF)
CUFF TRNQT CYL 24X4X16.5-23 (TOURNIQUET CUFF) IMPLANT
DRAPE C-ARMOR (DRAPES) ×1 IMPLANT
DRAPE IMP U-DRAPE 54X76 (DRAPES) ×2 IMPLANT
DRAPE INCISE IOBAN 66X45 STRL (DRAPES) ×1 IMPLANT
DRAPE OEC MINIVIEW 54X84 (DRAPES) ×2 IMPLANT
DRAPE U-SHAPE 47X51 STRL (DRAPES) ×2 IMPLANT
DRILL 2.5MM SMALL QC EVOS (BIT) ×2
DRILL QC 2.5MM SHORT EVOS SM (DRILL) ×2
DRSG EMULSION OIL 3X3 NADH (GAUZE/BANDAGES/DRESSINGS) IMPLANT
DRSG MEPILEX BORDER 4X12 (GAUZE/BANDAGES/DRESSINGS) ×1 IMPLANT
DRSG PAD ABDOMINAL 8X10 ST (GAUZE/BANDAGES/DRESSINGS) IMPLANT
ELECT REM PT RETURN 9FT ADLT (ELECTROSURGICAL) ×2
ELECTRODE REM PT RTRN 9FT ADLT (ELECTROSURGICAL) ×1 IMPLANT
GAUZE 4X4 16PLY ~~LOC~~+RFID DBL (SPONGE) ×2 IMPLANT
GAUZE SPONGE 4X4 12PLY STRL (GAUZE/BANDAGES/DRESSINGS) ×1 IMPLANT
GAUZE XEROFORM 5X9 LF (GAUZE/BANDAGES/DRESSINGS) ×1 IMPLANT
GLOVE SURG ORTHO LTX SZ9 (GLOVE) ×2 IMPLANT
GLOVE SURG UNDER POLY LF SZ9 (GLOVE) ×2 IMPLANT
GOWN STRL REUS W/ TWL XL LVL3 (GOWN DISPOSABLE) ×2 IMPLANT
GOWN STRL REUS W/TWL XL LVL3 (GOWN DISPOSABLE) ×2
GRAFT BNE CANC CHIPS 1-8 20CC (Bone Implant) IMPLANT
K-WIRE 1.6 (WIRE) ×2
K-WIRE FX150X1.6XTROC PNT (WIRE) ×2
KIT BASIN OR (CUSTOM PROCEDURE TRAY) ×2 IMPLANT
KIT TURNOVER KIT B (KITS) ×2 IMPLANT
KWIRE FX150X1.6XTROC PNT (WIRE) IMPLANT
MANIFOLD NEPTUNE II (INSTRUMENTS) ×2 IMPLANT
NS IRRIG 1000ML POUR BTL (IV SOLUTION) ×2 IMPLANT
PACK ORTHO EXTREMITY (CUSTOM PROCEDURE TRAY) ×2 IMPLANT
PACK UNIVERSAL I (CUSTOM PROCEDURE TRAY) ×2 IMPLANT
PAD ARMBOARD 7.5X6 YLW CONV (MISCELLANEOUS) ×4 IMPLANT
PAD CAST 4YDX4 CTTN HI CHSV (CAST SUPPLIES) IMPLANT
PADDING CAST COTTON 4X4 STRL (CAST SUPPLIES)
PLATE HUM EVOS 3.5X213 15H LT (Plate) ×1 IMPLANT
SCREW CORT 3.5X22 ST EVOS (Screw) ×2 IMPLANT
SCREW CORT 3.5X30 ST EVOS (Screw) ×1 IMPLANT
SCREW CORT EVOS ST 3.5X28 (Screw) ×5 IMPLANT
SCREW CORTEX 3.5X26 (Screw) ×3 IMPLANT
SCREW LOCK EVOS ST 3.5X34 (Screw) ×1 IMPLANT
SCREW LOCK ST EVOS 3.5X40 (Screw) ×3 IMPLANT
SET CYSTO W/LG BORE CLAMP LF (SET/KITS/TRAYS/PACK) ×1 IMPLANT
SPONGE T-LAP 18X18 ~~LOC~~+RFID (SPONGE) ×7 IMPLANT
STAPLER VISISTAT 35W (STAPLE) ×2 IMPLANT
STRIP CLOSURE SKIN 1/2X4 (GAUZE/BANDAGES/DRESSINGS) ×2 IMPLANT
SUCTION FRAZIER HANDLE 10FR (MISCELLANEOUS) ×1
SUCTION TUBE FRAZIER 10FR DISP (MISCELLANEOUS) ×1 IMPLANT
SUT ETHILON 3 0 PS 1 (SUTURE) ×2 IMPLANT
SUT PROLENE 3 0 PS 1 (SUTURE) IMPLANT
SUT VIC AB 0 CT1 27 (SUTURE) ×1
SUT VIC AB 0 CT1 27XBRD ANBCTR (SUTURE) IMPLANT
SUT VIC AB 2-0 CT1 27 (SUTURE) ×2
SUT VIC AB 2-0 CT1 TAPERPNT 27 (SUTURE) IMPLANT
SUT VIC AB 2-0 CTB1 (SUTURE) IMPLANT
SUT VIC AB 3-0 X1 27 (SUTURE) ×2 IMPLANT
SYR BULB EAR ULCER 3OZ GRN STR (SYRINGE) ×2 IMPLANT
TOWEL GREEN STERILE (TOWEL DISPOSABLE) ×2 IMPLANT
TOWEL GREEN STERILE FF (TOWEL DISPOSABLE) ×2 IMPLANT
TUBE CONNECTING 12X1/4 (SUCTIONS) ×2 IMPLANT
WATER STERILE IRR 1000ML POUR (IV SOLUTION) ×2 IMPLANT
YANKAUER SUCT BULB TIP NO VENT (SUCTIONS) ×2 IMPLANT

## 2021-12-28 NOTE — Transfer of Care (Signed)
Immediate Anesthesia Transfer of Care Note  Patient: Caitlin Odonnell  Procedure(s) Performed: LEFT OPEN REDUCTION INTERNAL FIXATION (ORIF) HUMERAL SHAFT FRACTURE (Left: Arm Upper)  Patient Location: PACU  Anesthesia Type:General and Regional  Level of Consciousness: awake and drowsy  Airway & Oxygen Therapy: Patient Spontanous Breathing and Patient connected to face mask oxygen  Post-op Assessment: Report given to RN and Post -op Vital signs reviewed and stable  Post vital signs: Reviewed and stable  Last Vitals:  Vitals Value Taken Time  BP 150/96 12/28/21 1524  Temp    Pulse    Resp 25 12/28/21 1528  SpO2    Vitals shown include unvalidated device data.  Last Pain:  Vitals:   12/28/21 1115  TempSrc:   PainSc: 0-No pain         Complications: No notable events documented.

## 2021-12-28 NOTE — Anesthesia Preprocedure Evaluation (Addendum)
Anesthesia Evaluation  Patient identified by MRN, date of birth, ID band Patient awake    Reviewed: Allergy & Precautions, H&P , NPO status , Patient's Chart, lab work & pertinent test results  Airway Mallampati: IV  TM Distance: >3 FB Neck ROM: Full  Mouth opening: Limited Mouth Opening  Dental no notable dental hx. (+) Teeth Intact, Dental Advisory Given   Pulmonary neg pulmonary ROS,    Pulmonary exam normal breath sounds clear to auscultation       Cardiovascular hypertension, Pt. on medications  Rhythm:Regular Rate:Normal     Neuro/Psych negative neurological ROS  negative psych ROS   GI/Hepatic negative GI ROS, Neg liver ROS,   Endo/Other  diabetes, Type 2, Oral Hypoglycemic AgentsMorbid obesity  Renal/GU negative Renal ROS  negative genitourinary   Musculoskeletal  (+) Arthritis , Osteoarthritis,    Abdominal   Peds  Hematology negative hematology ROS (+)   Anesthesia Other Findings   Reproductive/Obstetrics negative OB ROS                            Anesthesia Physical Anesthesia Plan  ASA: 3  Anesthesia Plan: General   Post-op Pain Management: Regional block* and Tylenol PO (pre-op)*   Induction: Intravenous  PONV Risk Score and Plan: 3 and Ondansetron, Dexamethasone and Midazolam  Airway Management Planned: Oral ETT  Additional Equipment:   Intra-op Plan:   Post-operative Plan: Extubation in OR  Informed Consent: I have reviewed the patients History and Physical, chart, labs and discussed the procedure including the risks, benefits and alternatives for the proposed anesthesia with the patient or authorized representative who has indicated his/her understanding and acceptance.     Dental advisory given  Plan Discussed with: CRNA  Anesthesia Plan Comments:         Anesthesia Quick Evaluation

## 2021-12-28 NOTE — Op Note (Signed)
Date of Surgery: 12/28/2021  INDICATIONS: Ms. Ke is a 66 y.o.-year-old female with left humeral shaft fracture which at 3 months has remained mobile with a significant concern for going on to nonunion.  As result the decision was made to offer her open reduction internal fixation..  The risk and benefits of the procedure with discussed in detail and documented in the pre-operative evaluation.  PREOPERATIVE DIAGNOSIS: 1.  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  PROCEDURE: 1.  Open reduction internal fixation left humerus 2.  Nonunion repair left humerus  SURGEON: Huel Cote, MD  ASSISTANT: Tad Moore  ANESTHESIA: Interscalene block with general  IV FLUIDS AND URINE: See anesthesia record.  ANTIBIOTICS: Ancef 3 g  ESTIMATED BLOOD LOSS: 50 mL.  IMPLANTS:  Implant Name Type Inv. Item Serial No. Manufacturer Lot No. LRB No. Used Action  BONE [facility] CHIPS 20CC PCAN1/4 - [id number] Bone Implant BONE [facility] CHIPS [facility] PCAN1/4 [id number] [facility]  Left 1 Implanted  SCREW CORTEX 3.5X26 - [id number] Screw SCREW CORTEX 3.5X26  [employer]  Left 1 Implanted  SCREW CORT EVOS ST 3.5X28 - [id number] Screw SCREW CORT EVOS ST 3.5X28  [employer]  Left 5 Implanted  SCREW CORT 3.5X30 ST EVOS - [id number] Screw SCREW CORT 3.5X30 ST EVOS  [employer]  Left 1 Implanted  Left 15 Hole Curved Proxmial Humerus Plate      Left 1 Implanted  SCREW LOCK ST EVOS 3.5X40 - [id number] Screw SCREW LOCK ST EVOS 3.5X40  [employer]  Left 3 Implanted  3.5x34 Locking Screw      Left 1 Implanted  SCREW CORT 3.5X16 ST EVOS - [id number] Screw SCREW CORT 3.5X16 ST EVOS  [employer]  Left 3 Implanted    DRAINS: None  CULTURES: None  COMPLICATIONS: none  DESCRIPTION OF PROCEDURE:  The patient was identified in the preoperative holding area.  The  correct site was marked per universal protocol with nursing.  Anesthesia performed an interscalene block.  She was subsequently taken back to the operating room table.  Ancef was given 1 hour prior to skin incision.  All bony prominences were padded.  She was prepped and draped in the usual sterile fashion.  Final timeout was performed.  We began with an anterior lateral approach to the humerus.  15 blade was used to incise the skin.  Electrocautery was used to cauterize through subcutaneous fat to achieve hemostasis.  The cephalic vein was identified proximally and protected.  At this point the interval between the pectoralis and the deltoid was exploited proximally.  The proximal aspect of the fracture was exposed.  This dissection was carried distally to the level of the fracture with electrocautery with care to remove fracture atrophic nonunion.  The biceps was mobilized medially.  At this time the brachialis split was performed with care to Bovie directly onto the bone lifting the brachialis laterally subperiosteally in order to protect the radial nerve.  Care was taken to stay on bone.  At this time a ronguer  was used to remove the final atrophic nonunion.  A plate was identified of the appropriate length based on AP and lateral fluoroscopy.  A proximal humeral plate was selected.  The plate was then placed on bone again with care to stay right directly on bone distally.  The plate was provisionally placed with K wires proximally and distally using a K  wire tower.  Visualization and alignment of her fracture were deemed acceptable based on the AP and lateral fluoroscopy.  At this time 2 screws were placed proximally and distally in a cortical fashion on either side of the fracture to achieve initial stabilization.  The AP and lateral were again checked for alignment.  There is a small amount of recurvatum which was not appreciated but excepted given the significant butterfly fragment.  At this time the  additional proximal and distal screws were placed.  The wound was thoroughly irrigated.  The fracture nonunion site was irrigated and 15 cc of cancellous bone chips were placed in this defect.  The wound was closed in layers including 0 Vicryl 2-0 Vicryl and 3-0 nylon.  All counts were correct at the end of the case.  Soft dressing with Xeroform and Mepilex was placed.  She was placed into a sling.  She was awoken taken the PACU without complication      POSTOPERATIVE PLAN: The patient will be nonweightbearing in a sling for 2 weeks.  During this time she may begin range of motion about the elbow and wrist.  At 2 weeks I will plan to gradually advance her active and active assisted range of motion.  She will see me back in 2 weeks for suture removal and wound check.  [name], MD 3:20 PM

## 2021-12-28 NOTE — Anesthesia Postprocedure Evaluation (Signed)
Anesthesia Post Note  Patient: Pryor Curia  Procedure(s) Performed: LEFT OPEN REDUCTION INTERNAL FIXATION (ORIF) HUMERAL SHAFT FRACTURE (Left: Arm Upper)     Patient location during evaluation: PACU Anesthesia Type: Regional and General Level of consciousness: awake and alert Pain management: pain level controlled Vital Signs Assessment: post-procedure vital signs reviewed and stable Respiratory status: spontaneous breathing, nonlabored ventilation, respiratory function stable and patient connected to nasal cannula oxygen Cardiovascular status: blood pressure returned to baseline and stable Postop Assessment: no apparent nausea or vomiting Anesthetic complications: no   No notable events documented.  Last Vitals:  Vitals:   12/28/21 1610 12/28/21 1625  BP: 121/75 124/67  Pulse: 85 85  Resp: (!) 22 (!) 22  Temp:    SpO2: 93% 93%    Last Pain:  Vitals:   12/28/21 1525  TempSrc:   PainSc: 0-No pain                 Jaila Schellhorn,W. EDMOND

## 2021-12-28 NOTE — Discharge Instructions (Signed)
= ° ° °   Discharge Instructions    Attending Surgeon: Huel Cote, MD Office Phone Number: [phone]   Diagnosis and Procedures:    Surgeries Performed: Left humeral open reduction internal fixation  Discharge Plan:    Diet: Resume usual diet. Begin with light or bland foods.  Drink plenty of fluids.  Activity:  Keep sling and dressing in place until your follow up visit in Physical Therapy You are advised to go home directly from the hospital or surgical center. Restrict your activities.  GENERAL INSTRUCTIONS: 1.  Keep your surgical site elevated above your heart for at least 5-7 days or longer to prevent swelling. This will improve your comfort and your overall recovery following surgery.     2. Please call Dr. Serena Croissant office at [phone] with questions Monday-Friday during business hours. If no one answers, please leave a message and someone should get back to the patient within 24 hours. For emergencies please call 911 or proceed to the emergency room.   3. Patient to notify surgical team if experiences any of the following: Bowel/Bladder dysfunction, uncontrolled pain, nerve/muscle weakness, incision with increased drainage or redness, nausea/vomiting and Fever greater than 101.0 F.  Be alert for signs of infection including redness, streaking, odor, fever or chills. Be alert for excessive pain or bleeding and notify your surgeon immediately.  WOUND INSTRUCTIONS:   Leave your dressing/cast/splint in place until your post operative visit.  Keep it clean and dry.  Always keep the incision clean and dry until the staples/sutures are removed. If there is no drainage from the incision you should keep it open to air. If there is drainage from the incision you must keep it covered at all times until the drainage stops  Do not soak in a bath tub, hot tub, pool, lake or other body of water until 21 days after your surgery and your incision is completely dry and healed.  If  you have removable sutures (or staples) they must be removed 10-14 days (unless otherwise instructed) from the day of your surgery.     1)  Elevate the extremity as much as possible.  2)  Keep the dressing clean and dry.  3)  Please call us if the dressing becomes wet or dirty.  4)  If you are experiencing worsening pain or worsening swelling, please call.     MEDICATIONS: Resume all previous home medications at the previous prescribed dose and frequency unless otherwise noted Start taking the  pain medications on an as-needed basis as prescribed  Please taper down pain medication over the next week following surgery.  Ideally you should not require a refill of any narcotic pain medication.  Take pain medication with food to minimize nausea. In addition to the prescribed pain medication, you may take over-the-counter pain relievers such as Tylenol.  Do NOT take additional tylenol if your pain medication already has tylenol in it.  Aspirin 325mg  daily for four weeks.      FOLLOWUP INSTRUCTIONS: 1. Follow up at the Physical Therapy Clinic 3-4 days following surgery. This appointment should be scheduled unless other arrangements have been made.The Physical Therapy scheduling number is [phone] if an appointment has not already been arranged.  2. Contact Dr. [phone] office during office hours at [phone] or the practice after hours line at [phone] for non-emergencies. For medical emergencies call 911.   Discharge Location: Home

## 2021-12-28 NOTE — Anesthesia Procedure Notes (Signed)
Anesthesia Regional Block: Interscalene brachial plexus block   Pre-Anesthetic Checklist: , timeout performed,  Correct Patient, Correct Site, Correct Laterality,  Correct Procedure, Correct Position, site marked,  Risks and benefits discussed,  Pre-op evaluation,  At surgeon's request and post-op pain management  Laterality: Left  Prep: Maximum Sterile Barrier Precautions used, chloraprep       Needles:  Injection technique: Single-shot  Needle Type: Echogenic Stimulator Needle     Needle Length: 5cm  Needle Gauge: 22     Additional Needles:   Procedures:, nerve stimulator,,, ultrasound used (permanent image in chart),,     Nerve Stimulator or Paresthesia:  Response: Biceps response, 0.5 mA  Additional Responses:   Narrative:  Start time: 12/28/2021 10:45 AM End time: 12/28/2021 10:55 AM Injection made incrementally with aspirations every 5 mL.  Performed by: Personally  Anesthesiologist: Gaynelle Adu, MD

## 2021-12-28 NOTE — Progress Notes (Signed)
Called and LVM for Dr. Steward Drone for orders.

## 2021-12-28 NOTE — Brief Op Note (Signed)
° °  Brief Op Note  Date of Surgery: 12/28/2021  Preoperative Diagnosis: Left humerus nonunion  Postoperative Diagnosis: same  Procedure: Procedure(s): LEFT OPEN REDUCTION INTERNAL FIXATION (ORIF) HUMERAL SHAFT FRACTURE  Implants: Implant Name Type Inv. Item Serial No. Manufacturer Lot No. LRB No. Used Action  BONE CANC CHIPS 20CC PCAN1/4 - [id number] Bone Implant BONE [facility] CHIPS [facility] PCAN1/4 [id number] [facility]  Left 1 Implanted  SCREW CORTEX 3.5X26 - [id number] Screw SCREW CORTEX 3.5X26  [employer]  Left 1 Implanted  SCREW CORT EVOS ST 3.5X28 - [id number] Screw SCREW CORT EVOS ST 3.5X28  [employer]  Left 5 Implanted  SCREW CORT 3.5X30 ST EVOS - [id number] Screw SCREW CORT 3.5X30 ST EVOS  [employer]  Left 1 Implanted  Left 15 Hole Curved Proxmial Humerus Plate      Left 1 Implanted  SCREW LOCK ST EVOS 3.5X40 - [id number] Screw SCREW LOCK ST EVOS 3.5X40  [employer]  Left 3 Implanted  3.5x34 Locking Screw      Left 1 Implanted  SCREW CORT 3.5X16 ST EVOS - [id number] Screw SCREW CORT 3.5X16 ST EVOS  [employer]  Left 3 Implanted    Surgeons: Surgeon(s): Huel Cote, MD  Anesthesia: Regional    Estimated Blood Loss: See anesthesia record  Complications: None  Condition to PACU: Stable  [name], MD 12/28/2021 3:19 PM

## 2021-12-28 NOTE — Interval H&P Note (Signed)
History and Physical Interval Note:  12/28/2021 11:28 AM  Caitlin Odonnell  has presented today for surgery, with the diagnosis of Left humerus nonunion.  The various methods of treatment have been discussed with the patient and family. After consideration of risks, benefits and other options for treatment, the patient has consented to  Procedure(s): LEFT OPEN REDUCTION INTERNAL FIXATION (ORIF) HUMERAL SHAFT FRACTURE (Left) as a surgical intervention.  The patient's history has been reviewed, patient examined, no change in status, stable for surgery.  I have reviewed the patient's chart and labs.  Questions were answered to the patient's satisfaction.     Huel Cote

## 2021-12-28 NOTE — Anesthesia Procedure Notes (Signed)
Procedure Name: Intubation Date/Time: 12/28/2021 12:19 PM Performed by: Zollie Beckers, CRNA Pre-anesthesia Checklist: Patient identified, Emergency Drugs available, Suction available and Patient being monitored Patient Re-evaluated:Patient Re-evaluated prior to induction Oxygen Delivery Method: Circle system utilized Preoxygenation: Pre-oxygenation with 100% oxygen Induction Type: IV induction Ventilation: Mask ventilation without difficulty Laryngoscope Size: Glidescope Grade View: Grade I Tube type: Oral Tube size: 7.5 mm Number of attempts: 1 Airway Equipment and Method: Rigid stylet Placement Confirmation: ETT inserted through vocal cords under direct vision, positive ETCO2 and breath sounds checked- equal and bilateral Secured at: 22 cm Tube secured with: Tape Dental Injury: Teeth and Oropharynx as per pre-operative assessment  Difficulty Due To: Difficulty was anticipated

## 2021-12-29 ENCOUNTER — Other Ambulatory Visit: Payer: Self-pay

## 2021-12-29 DIAGNOSIS — S42352K Displaced comminuted fracture of shaft of humerus, left arm, subsequent encounter for fracture with nonunion: Secondary | ICD-10-CM | POA: Diagnosis not present

## 2021-12-29 LAB — GLUCOSE, CAPILLARY: Glucose-Capillary: 186 mg/dL — ABNORMAL HIGH (ref 70–99)

## 2021-12-29 NOTE — Progress Notes (Signed)
° °  Subjective:  Patient reports pain as moderate, better with pain medication. Weaned of oxygen. Tolerating diet.  Objective:   VITALS:   Vitals:   12/28/21 1728 12/28/21 2028 12/28/21 2334 12/29/21 0306  BP: 127/65 125/72 (!) 146/56 128/63  Pulse: 92 92 98 93  Resp: 20 18 18 18   Temp: 98.3 F (36.8 C) 98.4 F (36.9 C) 98.2 F (36.8 C) 98.3 F (36.8 C)  TempSrc: Oral Oral Oral Oral  SpO2: 97% 95% 95% 95%  Weight:      Height:        Left arm dressing CDI. Able to fire EPL and wrist extensors. Sensation diminished in hand 2/2 block. 2+ RP.   Lab Results  Component Value Date   WBC 9.5 12/28/2021   HGB 13.7 12/28/2021   HCT 43.1 12/28/2021   MCV 95.1 12/28/2021   PLT 343 12/28/2021     Assessment/Plan:  1 Day Post-Op s/p left humeral shaft ORIF, doing well, weaned off oxygen overnight which was why overnight heparinization was required for cardiopulmonary monitoring  - DVT ppx - SCDs, ambulation, Aspirin - NWB operative extremity - Pain control - multimodal pain management, ATC acetaminophen in conjunction with as needed narcotic (oxycodone), although this should be minimized with other modalities  - Discharge home today  [facility] 12/29/2021, 6:30 AM

## 2021-12-29 NOTE — Progress Notes (Signed)
Discharge instructions given to pt. Pt verbalized understanding of all teaching. Pts ride will be here at 9:30am to pick her up.

## 2021-12-29 NOTE — Discharge Summary (Signed)
Patient ID: Caitlin Odonnell MRN: [id number] DOB/AGE: Jun 12, 1956 66 y.o.  Admit date: 12/28/2021 Discharge date: 12/29/2021  Admission Diagnoses:  Humeral shaft fracture  Discharge Diagnoses:  Principal Problem:   Humeral shaft fracture Active Problems:   Closed displaced comminuted fracture of shaft of left humerus   Past Medical History:  Diagnosis Date   Arthritis    Complication of anesthesia    as a child vomitted after tonsils removed   Diabetes mellitus without complication (HCC)    Hypertension    Neuropathy     Surgeries: Procedure(s): LEFT OPEN REDUCTION INTERNAL FIXATION (ORIF) HUMERAL SHAFT FRACTURE on 12/28/2021   Consultants (if any):   Discharged Condition: Improved  Hospital Course: Caitlin Odonnell is an 66 y.o. female who was admitted 12/28/2021 with a diagnosis of Humeral shaft fracture and went to the operating room on 12/28/2021 and underwent the above named procedures.    She was given perioperative antibiotics:  Anti-infectives (From admission, onward)    Start     Dose/Rate Route Frequency Ordered Stop   12/28/21 1438  vancomycin (VANCOCIN) powder  Status:  Discontinued          As needed 12/28/21 1439 12/28/21 1521   12/28/21 1045  ceFAZolin (ANCEF) IVPB 3g/100 mL premix        3 g 200 mL/hr over 30 Minutes Intravenous On call to O.R. 12/28/21 1035 12/28/21 1228   12/28/21 1039  ceFAZolin (ANCEF) 3-0.9 GM/100ML-% IVPB       Note to Pharmacy: [facility]y [place] L: cabinet override      12/28/21 1039 12/28/21 1228     .  She was given sequential compression devices, early ambulation, and appropriate chemoprophylaxis for DVT prophylaxis.  She benefited maximally from the hospital stay and there were no complications.    Recent vital signs:  Vitals:   12/28/21 2334 12/29/21 0306  BP: (!) 146/56 128/63  Pulse: 98 93  Resp: 18 18  Temp: 98.2 F (36.8 C) 98.3 F (36.8 C)  SpO2: 95% 95%    Recent laboratory studies:  Lab Results  Component Value  Date   HGB 13.7 12/28/2021   Lab Results  Component Value Date   WBC 9.5 12/28/2021   PLT 343 12/28/2021   No results found for: INR Lab Results  Component Value Date   NA 137 12/28/2021   K 4.0 12/28/2021   CL 102 12/28/2021   CO2 25 12/28/2021   BUN 13 12/28/2021   CREATININE 0.89 12/28/2021   GLUCOSE 123 (H) 12/28/2021    Discharge Medications:   Allergies as of 12/29/2021       Reactions   Cortisone Other (See Comments)   Had some numbness in her face. States it happened after a cortisone injection in her knee, had never had reaction to it prior to this incident.         Medication List     TAKE these medications    amLODipine 5 MG tablet Commonly known as: NORVASC Take 5 mg by mouth at bedtime.   aspirin EC 325 MG tablet Take 1 tablet (325 mg total) by mouth daily.   hydrOXYzine 25 MG tablet Commonly known as: ATARAX Take 25 mg by mouth at bedtime.   ibuprofen 200 MG tablet Commonly known as: ADVIL Take 400-600 mg by mouth every 8 (eight) hours as needed for moderate pain.   ibuprofen 800 MG tablet Commonly known as: ADVIL Take 1 tablet (800 mg total) by mouth every 8 (eight) hours  for 10 days. Please take with food, please alternate with acetaminophen   lisinopril-hydrochlorothiazide 20-25 MG tablet Commonly known as: ZESTORETIC Take 1 tablet by mouth daily.   metFORMIN 1000 MG tablet Commonly known as: GLUCOPHAGE Take 1,000 mg by mouth daily with breakfast.   Mounjaro 2.5 MG/0.5ML Pen Generic drug: tirzepatide Inject 2.5 mg into the skin every Tuesday.   oxyCODONE 5 MG immediate release tablet Commonly known as: Oxy IR/ROXICODONE Take 1 tablet (5 mg total) by mouth every 4 (four) hours as needed (severe pain).   Pain Relief Extra Strength 500 MG tablet Generic drug: acetaminophen Take 1 tablet (500 mg total) by mouth every 8 (eight) hours for 10 days.   pregabalin 50 MG capsule Commonly known as: LYRICA Take 50 mg by mouth at bedtime.  Pt may take a 2nd dose throughout the day if needed   simvastatin 5 MG tablet Commonly known as: ZOCOR Take 5 mg by mouth at bedtime.        Diagnostic Studies: DG Humerus Left  Result Date: 12/28/2021 CLINICAL DATA:  ORIF of the left humerus. EXAM: LEFT HUMERUS - 2 VIEW COMPARISON:  None. FINDINGS: Fluoroscopic images were obtained intraoperatively and submitted for post operative interpretation. Multiple images demonstrate ORIF of the left humerus with hardware in expected position, 19 images were obtained with 225.3 seconds of fluoroscopy time and 22.77 mGy. Please see the performing provider's procedural report for further detail. IMPRESSION: As above. Electronically Signed   By: Allegra Lai M.D.   On: 12/28/2021 16:30   DG Humerus Left  Result Date: 12/21/2021 CLINICAL DATA:  Followup closed displaced comminuted fracture, shaft of left humerus. EXAM: LEFT HUMERUS - 2+ VIEW COMPARISON:  Most recent radiograph 10/19/2021, additional priors reviewed. FINDINGS: Midshaft humeral fracture with slight increased in apex lateral angulation from prior exam. There is interval peripheral callus formation and bony bridging from prior exam, this remains incomplete. Dominant fracture line remains visible. The exam is otherwise unchanged. IMPRESSION: Slight increased in angulation of mid shaft humeral fracture from prior exam. There is interval callus formation with bony bridging since prior exam, however incomplete. Fracture line remains visible. Electronically Signed   By: Narda Rutherford M.D.   On: 12/21/2021 14:05   DG C-Arm 1-60 Min-No Report  Result Date: 12/28/2021 Fluoroscopy was utilized by the requesting physician.  No radiographic interpretation.   DG C-Arm 1-60 Min-No Report  Result Date: 12/28/2021 Fluoroscopy was utilized by the requesting physician.  No radiographic interpretation.   DG C-Arm 1-60 Min-No Report  Result Date: 12/28/2021 Fluoroscopy was utilized by the requesting  physician.  No radiographic interpretation.    Disposition: Discharge disposition: 01-Home or Self Care          Follow-up Information     Huel Cote, MD Follow up.   Specialty: Orthopedic Surgery Contact information: [address] [place] [address] [phone]                  Signed: Huel Cote 12/29/2021, 6:29 AM

## 2021-12-29 NOTE — Progress Notes (Signed)
Patient discharged to home via wheelchair with all belongings. ?

## 2021-12-31 ENCOUNTER — Encounter (HOSPITAL_COMMUNITY): Payer: Self-pay | Admitting: Orthopaedic Surgery

## 2022-01-01 ENCOUNTER — Ambulatory Visit: Payer: Medicare Other | Admitting: Physical Therapy

## 2022-01-01 ENCOUNTER — Encounter: Payer: Medicare Other | Admitting: Physical Therapy

## 2022-01-07 ENCOUNTER — Ambulatory Visit: Payer: Medicare Other | Admitting: Physical Therapy

## 2022-01-08 ENCOUNTER — Encounter: Payer: Medicare Other | Admitting: Physical Therapy

## 2022-01-13 ENCOUNTER — Other Ambulatory Visit: Payer: Self-pay

## 2022-01-13 ENCOUNTER — Ambulatory Visit (HOSPITAL_BASED_OUTPATIENT_CLINIC_OR_DEPARTMENT_OTHER)
Admission: RE | Admit: 2022-01-13 | Discharge: 2022-01-13 | Disposition: A | Discharge status: Home / Self Care | Payer: Medicare Other | Source: Ambulatory Visit | Attending: Orthopaedic Surgery | Admitting: Orthopaedic Surgery

## 2022-01-13 ENCOUNTER — Ambulatory Visit (INDEPENDENT_AMBULATORY_CARE_PROVIDER_SITE_OTHER): Payer: Medicare Other | Admitting: Orthopaedic Surgery

## 2022-01-13 ENCOUNTER — Other Ambulatory Visit (HOSPITAL_BASED_OUTPATIENT_CLINIC_OR_DEPARTMENT_OTHER): Payer: Self-pay | Admitting: Orthopaedic Surgery

## 2022-01-13 DIAGNOSIS — S42352A Displaced comminuted fracture of shaft of humerus, left arm, initial encounter for closed fracture: Secondary | ICD-10-CM

## 2022-01-13 NOTE — Progress Notes (Signed)
? ?                            ? ? ?Post Operative Evaluation ?  ? ?Procedure/Date of Surgery: Left humerus open reduction internal fixation 12/29/21 ? ?Interval History:  ? ?Presents today for follow-up 2 weeks status post the above procedure.  She is overall doing very well.  She has not been back to physical therapy this time.  Denies any wound issues or redness around the wounds.  Here today for wound check and assessment.  She has been compliant with aspirin usage.  Here today with her daughter. ? ? ?PMH/PSH/Family History/Social History/Meds/Allergies:   ? ?Past Medical History:  ?Diagnosis Date  ? Arthritis   ? Complication of anesthesia   ? as a child vomitted after tonsils removed  ? Diabetes mellitus without complication (HCC)   ? Hypertension   ? Neuropathy   ? ?Past Surgical History:  ?Procedure Laterality Date  ? APPENDECTOMY    ? CATARACT EXTRACTION W/ INTRAOCULAR LENS IMPLANT    ? CESAREAN SECTION    ? INNER EAR SURGERY    ? ORIF HUMERUS FRACTURE Left 12/28/2021  ? Procedure: LEFT OPEN REDUCTION INTERNAL FIXATION (ORIF) HUMERAL SHAFT FRACTURE;  Surgeon: Huel Cote, MD;  Location: [facility] OR;  Service: Orthopedics;  Laterality: Left;  ? TONSILLECTOMY    ? ?Social History  ? ?Socioeconomic History  ? Marital status: Widowed  ?  Spouse name: Not on file  ? Number of children: Not on file  ? Years of education: Not on file  ? Highest education level: Not on file  ?Occupational History  ? Not on file  ?Tobacco Use  ? Smoking status: Never  ? Smokeless tobacco: Never  ?Substance and Sexual Activity  ? Alcohol use: Never  ? Drug use: Never  ? Sexual activity: Not on file  ?Other Topics Concern  ? Not on file  ?Social History Narrative  ? Not on file  ? ?Social Determinants of Health  ? ?Financial Resource Strain: Not on file  ?Food Insecurity: Not on file  ?Transportation Needs: Not on file  ?Physical Activity: Not on file  ?Stress: Not on file  ?Social Connections: Not on file  ? ?No family history on  file. ?Allergies  ?Allergen Reactions  ? Cortisone Other (See Comments)  ?  Had some numbness in her face. States it happened after a cortisone injection in her knee, had never had reaction to it prior to this incident.   ? ?Current Outpatient Medications  ?Medication Sig Dispense Refill  ? amLODipine (NORVASC) 5 MG tablet Take 5 mg by mouth at bedtime.    ? aspirin EC 325 MG tablet Take 1 tablet (325 mg total) by mouth daily. (Patient not taking: Reported on 12/23/2021) 30 tablet 0  ? hydrOXYzine (ATARAX) 25 MG tablet Take 25 mg by mouth at bedtime.    ? ibuprofen (ADVIL) 200 MG tablet Take 400-600 mg by mouth every 8 (eight) hours as needed for moderate pain.    ? lisinopril-hydrochlorothiazide (ZESTORETIC) 20-25 MG tablet Take 1 tablet by mouth daily.    ? metFORMIN (GLUCOPHAGE) 1000 MG tablet Take 1,000 mg by mouth daily with breakfast.    ? MOUNJARO 2.5 MG/0.5ML Pen Inject 2.5 mg into the skin every Tuesday.    ? oxyCODONE (OXY IR/ROXICODONE) 5 MG immediate release tablet Take 1 tablet (5 mg total) by mouth every 4 (four) hours as needed (severe pain). (Patient  not taking: Reported on 12/23/2021) 20 tablet 0  ? pregabalin (LYRICA) 50 MG capsule Take 50 mg by mouth at bedtime. Pt may take a 2nd dose throughout the day if needed    ? simvastatin (ZOCOR) 5 MG tablet Take 5 mg by mouth at bedtime.    ? ?No current facility-administered medications for this visit.  ? ?No results found. ? ?Review of Systems:   ?A ROS was performed including pertinent positives and negatives as documented in the HPI. ? ? ?Musculoskeletal Exam:   ? ?There were no vitals taken for this visit. ? ?Left incision is clean dry intact without erythema or well-appearing.  She able flex and extend at the left elbow.  She able to fire EPL as well as the wrist extensors.  2+ radial pulse.  Fires interosseous muscles.  Sensations intact ?Distribution ? ?Imaging:   ? ?Left humerus 2 view x-ray: ?Status post open reduction internal fixation without  evidence of hardware failure ? ?I personally reviewed and interpreted the radiographs. ? ? ?Assessment:   ?66 year old female status post left humerus open reduction internal fixation at this time I would like her to be activity and range of motion as tolerated.  She will begin active and active assisted range of motion with her physical therapist.  I will see her back in 4 weeks for recheck.  By time I would like her to actively be operative forward elevation of 90 degrees ? ?Plan :   ? ?-Return to clinic in 4 weeks ? ? ? ? ?I personally saw and evaluated the patient, and participated in the management and treatment plan. ? ?Huel Cote, MD ?Attending Physician, Orthopedic Surgery ? ?This document was dictated using Conservation officer, historic buildings. A reasonable attempt at proof reading has been made to minimize errors. ?

## 2022-01-15 ENCOUNTER — Encounter: Payer: Self-pay | Admitting: Physical Therapy

## 2022-01-15 ENCOUNTER — Other Ambulatory Visit: Payer: Self-pay

## 2022-01-15 ENCOUNTER — Ambulatory Visit: Payer: Medicare Other | Attending: Orthopaedic Surgery | Admitting: Physical Therapy

## 2022-01-15 DIAGNOSIS — M25612 Stiffness of left shoulder, not elsewhere classified: Secondary | ICD-10-CM | POA: Insufficient documentation

## 2022-01-15 DIAGNOSIS — M6281 Muscle weakness (generalized): Secondary | ICD-10-CM | POA: Diagnosis present

## 2022-01-15 DIAGNOSIS — S42352A Displaced comminuted fracture of shaft of humerus, left arm, initial encounter for closed fracture: Secondary | ICD-10-CM | POA: Diagnosis not present

## 2022-01-15 DIAGNOSIS — M25512 Pain in left shoulder: Secondary | ICD-10-CM | POA: Diagnosis present

## 2022-01-15 DIAGNOSIS — M79622 Pain in left upper arm: Secondary | ICD-10-CM | POA: Insufficient documentation

## 2022-01-15 DIAGNOSIS — R293 Abnormal posture: Secondary | ICD-10-CM | POA: Insufficient documentation

## 2022-01-15 NOTE — Therapy (Signed)
[place] ?[facility] ?[address]  [address] ?[place], [place], [address] ?Phone: [phone]   Fax:  [phone] ? ?Physical Therapy Evaluation ? ?Patient Details  ?Name: Caitlin Odonnell ?MRN: [id number] ?Date of Birth: 1967/01/02 ?Referring Provider (PT): Huel Cote, MD ? ? ?Encounter Date: 01/15/2022 ? ? PT End of Session - 01/15/22 0814   ? ? Visit Number 1   ? Number of Visits 24   ? Date for PT Re-Evaluation 04/09/22   ? Authorization Type UHC Medicare   ? PT Start Time 0805   ? PT Stop Time 0902   ? PT Time Calculation (min) 57 min   ? Activity Tolerance Patient tolerated treatment well;Patient limited by pain   ? Behavior During Therapy [facility] for tasks assessed/performed   ? ?  ?  ? ?  ? ? ?Past Medical History:  ?Diagnosis Date  ? Arthritis   ? Complication of anesthesia   ? as a child vomitted after tonsils removed  ? Diabetes mellitus without complication (HCC)   ? Hypertension   ? Neuropathy   ? ? ?Past Surgical History:  ?Procedure Laterality Date  ? APPENDECTOMY    ? CATARACT EXTRACTION W/ INTRAOCULAR LENS IMPLANT    ? CESAREAN SECTION    ? INNER EAR SURGERY    ? ORIF HUMERUS FRACTURE Left 12/28/2021  ? Procedure: LEFT OPEN REDUCTION INTERNAL FIXATION (ORIF) HUMERAL SHAFT FRACTURE;  Surgeon: Huel Cote, MD;  [facility];  Service: Orthopedics;  Laterality: Left;  ? TONSILLECTOMY    ? ? ?There were no vitals filed for this visit. ? ? ? Subjective Assessment - 01/15/22 0808   ? ? Subjective Pt s/p ORIF on 12/28/21 for L humerus fracture after failed conservative union. Stitches removed on 01/13/22. Stil lhaving a lot of pain.   ? Pertinent History Closed displaced comminuted fracture of shaft of left humerus 07/25/21 s/p ORIF on 12/28/21   ? Patient Stated Goals "to get my arm working like normal"   ? Currently in Pain? Yes   ? Pain Score 6    ? Pain Location Arm   ? Pain Orientation Left;Upper   ? Pain Descriptors / Indicators Constant;Shooting   ? Pain Type  Surgical pain   ? Pain Radiating Towards occasional shooting pain into lower arm   ? Pain Onset 1 to 4 weeks ago   for surgical pain but pain sinc fracture 07/25/21  ? Pain Frequency Constant   ? Aggravating Factors  moving around, UB dressing   ? Pain Relieving Factors ibuprofen, Tylenol   ? ?  ?  ? ?  ? ? ? ? ? [facility] PT Assessment - 01/15/22 0805   ? ?  ? Assessment  ? Medical Diagnosis s/p ORIF for L midshaft humerus fracture   ? Referring Provider (PT) Huel Cote, MD   ? Onset Date/Surgical Date 12/28/21   fracture occurred on 07/25/21  ? Next MD Visit 02/15/22   ? Prior Therapy PT s/p fracture   ?  ? Precautions  ? Precautions Shoulder   ? Type of Shoulder Precautions per UE ORIF rehab protocol   ? Shoulder Interventions Shoulder sling/immobilizer;For comfort   pt has not used since MD appt on Wed  ?  ? Restrictions  ? Weight Bearing Restrictions Yes   ? LUE Weight Bearing Non weight bearing   ?  ? Balance Screen  ? Has the patient fallen in the past 6 months Yes   ?  How many times? 1   ? Has the patient had a decrease in activity level because of a fear of falling?  No   ? Is the patient reluctant to leave their home because of a fear of falling?  No   ?  ? Home Environment  ? Living Environment Private residence   ? Living Arrangements Non-relatives/Friends   ? Type of Home House   ? Home Access Stairs to enter   ? Entrance Stairs-Number of Steps 4   ? Entrance Stairs-Rails Right;Left   ? Home Layout One level   ?  ? Prior Function  ? Level of Independence Independent   ? Vocation Full time employment   ? 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 Leisure none - works 6 days/wk   ?  ? Observation/Other Assessments  ? Focus on Therapeutic Outcomes (FOTO)  Shoulder = 30; predicted D/C FS = 58   ?  ? Observation/Other Assessments-Edema   ? Edema --   Pt reports L UE stays swollen  ?  ? Sensation  ? Light Touch Appears Intact   ?  ? Posture/Postural Control  ? Posture/Postural  Control Postural limitations   ? Postural Limitations Forward head;Rounded Shoulders   ?  ? PROM  ? Left Shoulder Flexion 90 Degrees   ? Left Shoulder ABduction 78 Degrees   ? Left Shoulder Internal Rotation 65 Degrees   ? Left Shoulder External Rotation 44 Degrees   ? ?  ?  ? ?  ? ? ? ? ? ? ? ? ? ? ? ? ? ?Objective measurements completed on examination: See above findings.  ? ? ? ? ? [facility] Adult PT Treatment/Exercise - 01/15/22 0805   ? ?  ? Exercises  ? Exercises Shoulder   ?  ? Shoulder Exercises: Seated  ? Flexion Left;AAROM;10 reps   ? Flexion Weight (lbs) table slide   ? Abduction Left;10 reps;AAROM   ? ABduction Limitations scaption table slide   ?  ? Shoulder Exercises: ROM/Strengthening  ? Pendulum L shoulder flexion/extension, horiz ABD/ADD & CW/CCW circles x 1 min each   cues to use weight shift to create momemtum  ?  ? Modalities  ? Modalities Vasopneumatic   ?  ? Vasopneumatic  ? Number Minutes Vasopneumatic  10 minutes   ? Vasopnuematic Location  Shoulder   Lt  ? Vasopneumatic Pressure Low   ? Vasopneumatic Temperature  34?   ?  ? Manual Therapy  ? Manual Therapy Joint mobilization;Passive ROM   ? Joint Mobilization grade I-II l shoulder oscillations & CW/CCW circles   ? Passive ROM L shoulder - all planes to tolerance but not exeeding 90?   ? ?  ?  ? ?  ? ? ? ? ? ? ? ? ? ? ? ? PT Short Term Goals - 01/15/22 0902   ? ?  ? PT SHORT TERM GOAL #1  ? Title Patient will be independent with initial HEP   ? Status New   ? Target Date 02/05/22   ?  ? PT SHORT TERM GOAL #2  ? Title Improve posture and alignment with patient to demonstrate improved upright posture with posterior shoulder girdle engaged   ? Status New   ? Target Date 02/26/22   ?  ? PT SHORT TERM GOAL #3  ? Title Patient to improve L shoulder AROM to >/= 90? flexion and scaption/abduction to increase functional L UE use   ?  Status New   ? Target Date 02/26/22   ? ?  ?  ? ?  ? ? ? ? PT Long Term Goals - 01/15/22 0902   ? ?  ? PT LONG TERM GOAL #1   ? Title Patient will be independent with ongoing/advanced HEP for self-management at home in order to build upon functional gains in therapy   ? Status New   ? Target Date 04/09/22   ?  ? PT LONG TERM GOAL #2  ? Title Decrease pain in the L shoulder/proximal UE by >/= 50% allowing patient to use L UE for functional activities   ? Status New   ? Target Date 04/09/22   ?  ? PT LONG TERM GOAL #3  ? Title Patient to improve L shoulder AROM to [facility] without pain provocation   ? Status New   ? Target Date 04/09/22   ?  ? PT LONG TERM GOAL #4  ? Title Patient will demonstrate improved L shoulder strength to >/= 4/5 for functional UE use   ? Status New   ? Target Date 04/09/22   ?  ? PT LONG TERM GOAL #5  ? Title Patient to report ability to perform ADLs, household, and work-related tasks without limitation due to L UE/shoulder pain, LOM or weakness   ? Status New   ? Target Date 04/09/22   ? ?  ?  ? ?  ? ? ? ? ? ? ? ? ? Plan - 01/15/22 0926   ? ? Clinical Impression Statement [place] is a 66 y/o female who presents to OP PT 18 days post-op from ORIF on 12/28/21 for closed displaced comminuted fracture of midshaft of left humerus on 07/25/21 with failed union via conservative methods. She was seen by MD on 01/13/22 for suture removal and has since weaned herself from the sling stating the sling made her arm/shoulder hurt more. Current deficits include L upper arm and shoulder pain with intermittent radicular pain into L hand and fingers, postural abnormalities, limited L shoulder PROM along with decreased elbow, wrist and hand AROM, increased muscle tension/guarding throughout L shoulder complex, increased edema of L UE, decreased L shoulder/UE strength and limited functional use of L arm. [place]aya will benefit from skilled PT to address above deficits, improve posture and restore pain-free functional ROM and strength in L shoulder to allow her to resume normal daily activities without pain interference. Reviewed relevant exercises  from prior HEP focusing on pendulums, gentle flexion and scaption P/AAROM table slides below 90?, elbow, wrist and hand exercises - patient cautioned not to push through painful ROM and to defer all resis

## 2022-01-19 ENCOUNTER — Ambulatory Visit: Payer: Medicare Other

## 2022-01-19 ENCOUNTER — Other Ambulatory Visit: Payer: Self-pay

## 2022-01-19 DIAGNOSIS — M6281 Muscle weakness (generalized): Secondary | ICD-10-CM

## 2022-01-19 DIAGNOSIS — R293 Abnormal posture: Secondary | ICD-10-CM

## 2022-01-19 DIAGNOSIS — M25612 Stiffness of left shoulder, not elsewhere classified: Secondary | ICD-10-CM

## 2022-01-19 DIAGNOSIS — M79622 Pain in left upper arm: Secondary | ICD-10-CM

## 2022-01-19 DIAGNOSIS — M25512 Pain in left shoulder: Secondary | ICD-10-CM

## 2022-01-19 NOTE — Therapy (Signed)
[place] ?[facility] ?[address]  [address] ?[place], [place], [address] ?Phone: [phone]   Fax:  [phone] ? ?Physical Therapy Treatment ? ?Patient Details  ?Name: Caitlin Odonnell ?MRN: [id number] ?Date of Birth: 12-Mar-1956 ?Referring Provider (PT): Huel Cote, MD ? ? ?Encounter Date: 01/19/2022 ? ? PT End of Session - 01/19/22 0846   ? ? Visit Number 2   ? Number of Visits 24   ? Date for PT Re-Evaluation 04/09/22   ? Authorization Type UHC Medicare   ? PT Start Time 0805   ? PT Stop Time [phone]   ? PT Time Calculation (min) 38 min   ? Activity Tolerance Patient tolerated treatment well   ? Behavior During Therapy [facility] for tasks assessed/performed   ? ?  ?  ? ?  ? ? ?Past Medical History:  ?Diagnosis Date  ? Arthritis   ? Complication of anesthesia   ? as a child vomitted after tonsils removed  ? Diabetes mellitus without complication (HCC)   ? Hypertension   ? Neuropathy   ? ? ?Past Surgical History:  ?Procedure Laterality Date  ? APPENDECTOMY    ? CATARACT EXTRACTION W/ INTRAOCULAR LENS IMPLANT    ? CESAREAN SECTION    ? INNER EAR SURGERY    ? ORIF HUMERUS FRACTURE Left 12/28/2021  ? Procedure: LEFT OPEN REDUCTION INTERNAL FIXATION (ORIF) HUMERAL SHAFT FRACTURE;  Surgeon: Huel Cote, MD;  [facility];  Service: Orthopedics;  Laterality: Left;  ? TONSILLECTOMY    ? ? ?There were no vitals filed for this visit. ? ? Subjective Assessment - 01/19/22 0807   ? ? Subjective Pt reports constant ache in her L shoulder from surgery. Now out of sling.   ? Pertinent History Closed displaced comminuted fracture of shaft of left humerus 07/25/21 s/p ORIF on 12/28/21   ? Diagnostic tests 09/22/21 - L humerus x-ray: Healing midshaft humeral fracture in unchanged alignment. Interval peripheral callus formation.   ? Patient Stated Goals "to get my arm working like normal"   ? Currently in Pain? Yes   ? Pain Score 4    ? Pain Location Arm   ? Pain Orientation Left;Upper;Anterior    ? Pain Descriptors / Indicators Constant;Aching   ? Pain Type Surgical pain   ? ?  ?  ? ?  ? ? ? ? ? ? ? ? ? ? ? ? ? ? ? ? ? ? ? ? [facility] Adult PT Treatment/Exercise - 01/19/22 0001   ? ?  ? Shoulder Exercises: Supine  ? Flexion AAROM;Both;10 reps   ? Flexion Limitations with cane not past 90 deg   ?  ? Shoulder Exercises: Seated  ? Retraction Strengthening;Both;20 reps   ? Flexion Left;AAROM;10 reps   ? Flexion Weight (lbs) table slide   ? Abduction Left;10 reps;AAROM   ? ABduction Limitations scaption table slide   ? Other Seated Exercises shoulder rolls backward x 20   ? Other Seated Exercises wrist isometrics with elbow in 90 deg of flexion x 10   ?  ? Shoulder Exercises: Standing  ? Other Standing Exercises 3 way pendulum (fwd, lateral, and circular) x 10 each   ?  ? Shoulder Exercises: Pulleys  ? Flexion 3 minutes   ? Flexion Limitations not past 90 deg   ? Scaption 3 minutes   ? Scaption Limitations not past 90 deg   ?  ? Manual Therapy  ? Manual Therapy Passive ROM   ?  Passive ROM L shoulder - all planes to tolerance but not exeeding 90?   ? ?  ?  ? ?  ? ? ? ? ? ? ? ? ? ? ? ? PT Short Term Goals - 01/19/22 0950   ? ?  ? PT SHORT TERM GOAL #1  ? Title Patient will be independent with initial HEP   ? Status On-going   ? Target Date 02/05/22   ?  ? PT SHORT TERM GOAL #2  ? Title Improve posture and alignment with patient to demonstrate improved upright posture with posterior shoulder girdle engaged   ? Status On-going   ? Target Date 02/26/22   ?  ? PT SHORT TERM GOAL #3  ? Title Patient to improve L shoulder AROM to >/= 90? flexion and scaption/abduction to increase functional L UE use   ? Status On-going   ? Target Date 02/26/22   ? ?  ?  ? ?  ? ? ? ? PT Long Term Goals - 01/19/22 0955   ? ?  ? PT LONG TERM GOAL #1  ? Title Patient will be independent with ongoing/advanced HEP for self-management at home in order to build upon functional gains in therapy   ? Status On-going   ? Target Date 04/09/22   ?  ? PT  LONG TERM GOAL #2  ? Title Decrease pain in the L shoulder/proximal UE by >/= 50% allowing patient to use L UE for functional activities   ? Status On-going   ? Target Date 04/09/22   ?  ? PT LONG TERM GOAL #3  ? Title Patient to improve L shoulder [facility]OM to [facility] without pain provocation   ? Status On-going   ? Target Date 04/09/22   ?  ? PT LONG TERM GOAL #4  ? Title Patient will demonstrate improved L shoulder strength to >/= 4/5 for functional UE use   ? Status On-going   ? Target Date 04/09/22   ?  ? PT LONG TERM GOAL #5  ? Title Patient to report ability to perform ADLs, household, and work-related tasks without limitation due to L UE/shoulder pain, LOM or weakness   ? Status On-going   ? Target Date 04/09/22   ? ?  ?  ? ?  ? ? ? ? ? ? ? ? Plan - 01/19/22 0846   ? ? Clinical Impression Statement Pt presented today with less pain from eval. She was able to do pulleys w/o increased and I encouraged her start using pulleys at home but to not to go past 90 deg of flex or scaption. Throughout session pt was advised to adhere to ROM limitations as she is still 3 weeks post op.  Still much guarding noted during MT but otherwise no increase in pain.   ? Personal Factors and Comorbidities Comorbidity 2;Past/Current Experience;Time since onset of injury/illness/exacerbation;Fitness   ? Comorbidities DM, HTN   ? PT Frequency 2x / week   ? PT Duration 12 weeks   ? PT Treatment/Interventions ADLs/Self Care Home Management;Cryotherapy;Electrical Stimulation;Iontophoresis 4mg /ml Dexamethasone;Moist Heat;Therapeutic exercise;Therapeutic activities;Neuromuscular re-education;Patient/family education;Manual techniques;Passive range of motion;Vasopneumatic Device;Joint Manipulations;Dry needling;Taping   ? PT Next Visit Plan rehab per UE ORIF rehab protocol - post-op week #3 as of 01/18/22 (Sx 12/28/21) - gentle ROM not exceeding 90? in any plane, pendulums, pully OK for flexion/scaption   ? PT Home Exercise Plan Access Code: [email or web address]    ? Consulted and Agree with Plan of Care Patient   ? ?  ?  ? ?  ? ? ?  Patient will benefit from skilled therapeutic intervention in order to improve the following deficits and impairments:  Decreased activity tolerance, Decreased knowledge of precautions, Decreased mobility, Decreased range of motion, Decreased strength, Increased edema, Impaired perceived functional ability, Impaired UE functional use, Improper body mechanics, Postural dysfunction, Pain, Decreased skin integrity, Decreased scar mobility ? ?Visit Diagnosis: ?Pain of left upper arm ? ?Acute pain of left shoulder ? ?Stiffness of left shoulder, not elsewhere classified ? ?Muscle weakness (generalized) ? ?Abnormal posture ? ? ? ? ?Problem List ?Patient Active Problem List  ? Diagnosis Date Noted  ? Humeral shaft fracture 12/28/2021  ? Closed displaced comminuted fracture of shaft of left humerus   ? ? ?Darleene Cleaver, PTA ?01/19/2022, 10:27 AM ? ?[place] ?[facility] ?[address]  [address] ?[place], [place], [address] ?Phone: [phone]   Fax:  [phone] ? ?Name: Caitlin Odonnell ?MRN: [id number] ?Date of Birth: May 08, 1956 ? ? ? ?

## 2022-01-22 ENCOUNTER — Ambulatory Visit: Payer: Medicare Other | Admitting: Physical Therapy

## 2022-01-22 ENCOUNTER — Other Ambulatory Visit: Payer: Self-pay

## 2022-01-22 ENCOUNTER — Encounter: Payer: Self-pay | Admitting: Physical Therapy

## 2022-01-22 DIAGNOSIS — M79622 Pain in left upper arm: Secondary | ICD-10-CM

## 2022-01-22 DIAGNOSIS — M25512 Pain in left shoulder: Secondary | ICD-10-CM

## 2022-01-22 DIAGNOSIS — R293 Abnormal posture: Secondary | ICD-10-CM

## 2022-01-22 DIAGNOSIS — M25612 Stiffness of left shoulder, not elsewhere classified: Secondary | ICD-10-CM

## 2022-01-22 DIAGNOSIS — M6281 Muscle weakness (generalized): Secondary | ICD-10-CM

## 2022-01-22 NOTE — Therapy (Signed)
[place] ?[facility] ?[place] ?[place], [place], [address] ?Phone: [phone]   Fax:  [phone] ? ?Physical Therapy Treatment ? ?Patient Details  ?Name: Caitlin Odonnell ?MRN: [id number] ?Date of Birth: 1956-06-14 ?Referring Provider (PT): Vanetta Mulders, MD ? ? ?Encounter Date: 01/22/2022 ? ? PT End of Session - 01/22/22 0802   ? ? Visit Number 3   ? Number of Visits 24   ? Date for PT Re-Evaluation 04/09/22   ? Authorization Type UHC Medicare   ? PT Start Time 0802   ? PT Stop Time [phone]   ? PT Time Calculation (min) 49 min   ? Activity Tolerance Patient tolerated treatment well   ? Behavior During Therapy [facility] for tasks assessed/performed   ? ?  ?  ? ?  ? ? ?Past Medical History:  ?Diagnosis Date  ? Arthritis   ? Complication of anesthesia   ? as a child vomitted after tonsils removed  ? Diabetes mellitus without complication ([place])   ? Hypertension   ? Neuropathy   ? ? ?Past Surgical History:  ?Procedure Laterality Date  ? APPENDECTOMY    ? CATARACT EXTRACTION W/ INTRAOCULAR LENS IMPLANT    ? CESAREAN SECTION    ? INNER EAR SURGERY    ? ORIF HUMERUS FRACTURE Left 12/28/2021  ? Procedure: LEFT OPEN REDUCTION INTERNAL FIXATION (ORIF) HUMERAL SHAFT FRACTURE;  Surgeon: Vanetta Mulders, MD;  Location: [place];  Service: Orthopedics;  Laterality: Left;  ? TONSILLECTOMY    ? ? ?There were no vitals filed for this visit. ? ? Subjective Assessment - 01/22/22 0803   ? ? Subjective Pt reports constant ache in her L shoulder from surgery. Now out of sling.   ? Pertinent History Closed displaced comminuted fracture of shaft of left humerus 07/25/21 s/p ORIF on 12/28/21   ? Patient Stated Goals "to get my arm working like normal"   ? Currently in Pain? Yes   ? Pain Score 4    ? Pain Location Arm   ? Pain Orientation Left;Upper;Anterior   ? Pain Descriptors / Indicators Constant;Aching   ? Pain Type Surgical pain   ? Pain Frequency Constant   ? ?  ?  ? ?   ? ? ? ? ? ? ? ? ? ? ? ? ? ? ? ? ? ? ? ? [place] Adult PT Treatment/Exercise - 01/22/22 0802   ? ?  ? Shoulder Exercises: Seated  ? Retraction Both;20 reps;AROM;Strengthening   ? Retraction Limitations + depression   ? Other Seated Exercises B shoulder rolls backward x 20   ?  ? Shoulder Exercises: Pulleys  ? Flexion 3 minutes   ? Flexion Limitations to tolerance - not past 90 deg   ? Scaption 3 minutes   ? Scaption Limitations to tolerance - not past 90 deg   ?  ? Shoulder Exercises: Therapy Ball  ? Flexion Both;20 reps   ? Flexion Limitations Green Pball rollout on floor   ? Scaption Both;20 reps   ? Scaption Limitations Green Pball rollout on floor   ?  ? Shoulder Exercises: ROM/Strengthening  ? Pendulum L shoulder flexion/extension, horiz ABD/ADD & CW/CCW circles x 1 min each   cues to use weight shift to create momemtum  ?  ? Vasopneumatic  ? Number Minutes Vasopneumatic  5 minutes   ? Vasopnuematic Location  Shoulder   Lt  ? Vasopneumatic Pressure Low   ? Vasopneumatic Temperature  34?   ?  ? Manual Therapy  ? Manual Therapy Joint mobilization;Soft tissue mobilization;Passive ROM   ? Joint Mobilization grade I-II l shoulder oscillations & CW/CCW circles   ? Soft tissue mobilization Incisional scar massage   ? Passive ROM L shoulder - all planes to tolerance but not exeeding 90?   ? ?  ?  ? ?  ? ? ? ? ? ? ? ? ? ? ? ? PT Short Term Goals - 01/19/22 0950   ? ?  ? PT SHORT TERM GOAL #1  ? Title Patient will be independent with initial HEP   ? Status On-going   ? Target Date 02/05/22   ?  ? PT SHORT TERM GOAL #2  ? Title Improve posture and alignment with patient to demonstrate improved upright posture with posterior shoulder girdle engaged   ? Status On-going   ? Target Date 02/26/22   ?  ? PT SHORT TERM GOAL #3  ? Title Patient to improve L shoulder AROM to >/= 90? flexion and scaption/abduction to increase functional L UE use   ? Status On-going   ? Target Date 02/26/22   ? ?  ?  ? ?  ? ? ? ? PT Long Term Goals -  01/19/22 0955   ? ?  ? PT LONG TERM GOAL #1  ? Title Patient will be independent with ongoing/advanced HEP for self-management at home in order to build upon functional gains in therapy   ? Status On-going   ? Target Date 04/09/22   ?  ? PT LONG TERM GOAL #2  ? Title Decrease pain in the L shoulder/proximal UE by >/= 50% allowing patient to use L UE for functional activities   ? Status On-going   ? Target Date 04/09/22   ?  ? PT LONG TERM GOAL #3  ? Title Patient to improve L shoulder AROM to [facility] without pain provocation   ? Status On-going   ? Target Date 04/09/22   ?  ? PT LONG TERM GOAL #4  ? Title Patient will demonstrate improved L shoulder strength to >/= 4/5 for functional UE use   ? Status On-going   ? Target Date 04/09/22   ?  ? PT LONG TERM GOAL #5  ? Title Patient to report ability to perform ADLs, household, and work-related tasks without limitation due to L UE/shoulder pain, LOM or weakness   ? Status On-going   ? Target Date 04/09/22   ? ?  ?  ? ?  ? ? ? ? ? ? ? ? Plan - 01/22/22 0806   ? ? Clinical Impression Statement Caitlin Odonnell reports she still struggles with the pendulum exercises, feeling more like she using her lower arm rather than her shoulder - reviewed technique emphasizing using body weight shift to create momentum for arm swing from shoulder while keeping the shoulder/arm fully relaxed. Performed gentle AAROM and PROM < 90? and w/in pain free ROM with good tolerance. Instructed pt in gentle scar massage to decreased scar restriction with ROM and improve movement tolerance. Pt will be ready to transition into next phase of rehab program as of next week as she will be 4 weeks post-op as of 12/28/21.   ? Comorbidities DM, HTN   ? Rehab Potential Good   ? PT Frequency 2x / week   ? PT Duration 12 weeks   ? PT Treatment/Interventions ADLs/Self Care Home Management;Cryotherapy;Electrical Stimulation;Iontophoresis 4mg /ml Dexamethasone;Moist Heat;Therapeutic exercise;Therapeutic activities;Neuromuscular  re-education;Patient/family education;Manual  techniques;Passive range of motion;Vasopneumatic Device;Joint Manipulations;Dry needling;Taping   ? PT Next Visit Plan rehab per UE ORIF rehab protocol - post-op week #4 as of 01/25/22 (Sx 12/28/21)   ? PT Home Exercise Plan Access Code: [email or web address]   ? Consulted and Agree with Plan of Care Patient   ? ?  ?  ? ?  ? ? ?Patient will benefit from skilled therapeutic intervention in order to improve the following deficits and impairments:  Decreased activity tolerance, Decreased knowledge of precautions, Decreased mobility, Decreased range of motion, Decreased strength, Increased edema, Impaired perceived functional ability, Impaired UE functional use, Improper body mechanics, Postural dysfunction, Pain, Decreased skin integrity, Decreased scar mobility ? ?Visit Diagnosis: ?Pain of left upper arm ? ?Acute pain of left shoulder ? ?Stiffness of left shoulder, not elsewhere classified ? ?Muscle weakness (generalized) ? ?Abnormal posture ? ? ? ? ?Problem List ?Patient Active Problem List  ? Diagnosis Date Noted  ? Humeral shaft fracture 12/28/2021  ? Closed displaced comminuted fracture of shaft of left humerus   ? ? ?Percival Spanish, PT ?01/22/2022, 1:08 PM ? ?[place] ?[facility] ?[place] ?[place], [place], [address] ?Phone: [phone]   Fax:  [phone] ? ?Name: Caitlin Odonnell ?MRN: [id number] ?Date of Birth: 07-02-1956 ? ? ? ?

## 2022-01-26 ENCOUNTER — Encounter: Payer: Medicare Other | Admitting: Physical Therapy

## 2022-01-27 ENCOUNTER — Encounter: Payer: Self-pay | Admitting: Physical Therapy

## 2022-01-27 ENCOUNTER — Other Ambulatory Visit: Payer: Self-pay

## 2022-01-27 ENCOUNTER — Ambulatory Visit: Payer: Medicare Other | Admitting: Physical Therapy

## 2022-01-27 DIAGNOSIS — M79622 Pain in left upper arm: Secondary | ICD-10-CM

## 2022-01-27 DIAGNOSIS — M25512 Pain in left shoulder: Secondary | ICD-10-CM

## 2022-01-27 DIAGNOSIS — M6281 Muscle weakness (generalized): Secondary | ICD-10-CM

## 2022-01-27 DIAGNOSIS — M25612 Stiffness of left shoulder, not elsewhere classified: Secondary | ICD-10-CM

## 2022-01-27 DIAGNOSIS — R293 Abnormal posture: Secondary | ICD-10-CM

## 2022-01-27 NOTE — Therapy (Signed)
[place] ?[address]  [address] ?[place], [place], [address] ?Phone: [phone]   Fax:  [phone] ? ?Physical Therapy Treatment ? ?Patient Details  ?Name: Caitlin Odonnell ?MRN: [id number] ?Date of Birth: 1956/04/09 ?Referring Provider (PT): Huel Cote, MD ? ? ?Encounter Date: 01/27/2022 ? ? PT End of Session - 01/27/22 0803   ? ? Visit Number 4   ? Number of Visits 24   ? Date for PT Re-Evaluation 04/09/22   ? Authorization Type U[employer] Medicare   ? PT Start Time [phone]   ? PT Stop Time 0854   ? PT Time Calculation (min) 51 min   ? Activity Tolerance Patient tolerated treatment well   ? Behavior During Therapy [facility] for tasks assessed/performed   ? ?  ?  ? ?  ? ? ?Past Medical History:  ?Diagnosis Date  ? Arthritis   ? Complication of anesthesia   ? as a child vomitted after tonsils removed  ? Diabetes mellitus without complication (HCC)   ? Hypertension   ? Neuropathy   ? ? ?Past Surgical History:  ?Procedure Laterality Date  ? APPENDECTOMY    ? CATARACT EXTRACTION W/ INTRAOCULAR LENS IMPLANT    ? CESAREAN SECTION    ? INNER EAR SURGERY    ? ORIF HUMERUS FRACTURE Left 12/28/2021  ? Procedure: LEFT OPEN REDUCTION INTERNAL FIXATION (ORIF) HUMERAL SHAFT FRACTURE;  Surgeon: Huel Cote, MD;  Location: [facility];  Service: Orthopedics;  Laterality: Left;  ? TONSILLECTOMY    ? ? ?There were no vitals filed for this visit. ? ? Subjective Assessment - 01/27/22 0806   ? ? Pertinent History Closed displaced comminuted fracture of shaft of left humerus 07/25/21 s/p ORIF on 12/28/21   ? Patient Stated Goals "to get my arm working like normal"   ? Currently in Pain? Yes   ? Pain Score 3    ? Pain Location Arm   ? Pain Orientation Left   ? Pain Descriptors / Indicators Dull;Aching   ? Pain Type Surgical pain   ? Pain Frequency Constant   ? ?  ?  ? ?  ? ? ? ? ? ? ? ? ? ? ? ? ? ? ? ? ? ? ? ? [facility] Adult PT Treatment/Exercise - 01/27/22 0803   ? ?  ? Shoulder Exercises: Supine  ?  External Rotation Left;AAROM;10 reps   2 sets  ? External Rotation Limitations wand to tolerance   ? Flexion Both;AAROM;Left;10 reps   2 sets  ? Flexion Limitations wand to tolerance   ? ABduction Left;10 reps;AAROM   ? ABduction Limitations wand to tolerance   ?  ? Shoulder Exercises: Seated  ? Retraction Both;20 reps;AROM;Strengthening   ? Retraction Limitations + depression   ? Flexion Left;10 reps;AAROM   ? Flexion Limitations Swiffer as UE ranger   ? Abduction Left;10 reps;AAROM   ? ABduction Limitations Swiffer as UE ranger   ? Other Seated Exercises UE ranger using swiffer - scaption x 10, CW/CCW circle (stirring motion) in flexion plane x10 each direction - hand supported on shaft of swiffer   ?  ? Shoulder Exercises: Standing  ? ABduction Left;10 reps;AAROM   ? ABduction Limitations wand to tolerance   ?  ? Shoulder Exercises: Pulleys  ? Flexion 3 minutes   ? Flexion Limitations to tolerance   ? Scaption 3 minutes   ? Scaption Limitations to tolerance   ?  ? Shoulder Exercises: Isometric  Strengthening  ? Flexion --   10 x 5"  ? Flexion Limitations submax - 30-50% effort   ? Extension --   10 x 5"  ? Extension Limitations submax - 30-50% effort   ? External Rotation --   10 x 5"  ? External Rotation Limitations submax - 30-50% effort   ? Internal Rotation --   10 x 5"  ? Internal Rotation Limitations submax - 30-50% effort   ? ABduction --   10 x 5"  ? ABduction Limitations submax - 30-50% effort   ?  ? Vasopneumatic  ? Number Minutes Vasopneumatic  10 minutes   ? Vasopnuematic Location  Shoulder   Lt  ? Vasopneumatic Pressure Low   ? Vasopneumatic Temperature  34?   ? ?  ?  ? ?  ? ? ? ? ? ? ? ? ? ? PT Education - 01/27/22 0844   ? ? Education Details HEP update - shoulder isometrics and UE ranger using mop handle   ? Person(s) Educated Patient   ? Methods Explanation;Demonstration;Verbal cues   pt declined printed handouts  ? Comprehension Verbalized understanding;Returned demonstration;Need further  instruction   ? ?  ?  ? ?  ? ? ? PT Short Term Goals - 01/19/22 0950   ? ?  ? PT SHORT TERM GOAL #1  ? Title Patient will be independent with initial HEP   ? Status On-going   ? Target Date 02/05/22   ?  ? PT SHORT TERM GOAL #2  ? Title Improve posture and alignment with patient to demonstrate improved upright posture with posterior shoulder girdle engaged   ? Status On-going   ? Target Date 02/26/22   ?  ? PT SHORT TERM GOAL #3  ? Title Patient to improve L shoulder AROM to >/= 90? flexion and scaption/abduction to increase functional L UE use   ? Status On-going   ? Target Date 02/26/22   ? ?  ?  ? ?  ? ? ? ? PT Long Term Goals - 01/19/22 0955   ? ?  ? PT LONG TERM GOAL #1  ? Title Patient will be independent with ongoing/advanced HEP for self-management at home in order to build upon functional gains in therapy   ? Status On-going   ? Target Date 04/09/22   ?  ? PT LONG TERM GOAL #2  ? Title Decrease pain in the L shoulder/proximal UE by >/= 50% allowing patient to use L UE for functional activities   ? Status On-going   ? Target Date 04/09/22   ?  ? PT LONG TERM GOAL #3  ? Title Patient to improve L shoulder AROM to [facility] without pain provocation   ? Status On-going   ? Target Date 04/09/22   ?  ? PT LONG TERM GOAL #4  ? Title Patient will demonstrate improved L shoulder strength to >/= 4/5 for functional UE use   ? Status On-going   ? Target Date 04/09/22   ?  ? PT LONG TERM GOAL #5  ? Title Patient to report ability to perform ADLs, household, and work-related tasks without limitation due to L UE/shoulder pain, LOM or weakness   ? Status On-going   ? Target Date 04/09/22   ? ?  ?  ? ?  ? ? ? ? ? ? ? ? Plan - 01/27/22 0810   ? ? Clinical Impression Statement Caitlin DandyMary is entering the 2nd phase of her post-op rehab protocol  therefore initiated gentle AAROM progression beyond 90? in supine and sitting repeatedly cautioning pt not to push into painful ROM. Initiated submax L shoulder isometrics, again cautioning pt to  avoid excessive pressure or pain with muscle activation. Pt declining need for printed handouts, stating familiarity of exercises from prior PT episode. Session concluded with GR vasopnuematic compression to reduce post-exercise soreness and inflammation.   ? Comorbidities DM, HTN   ? Rehab Potential Good   ? PT Frequency 2x / week   ? PT Duration 12 weeks   ? PT Treatment/Interventions ADLs/Self Care Home Management;Cryotherapy;Electrical Stimulation;Iontophoresis 4mg /ml Dexamethasone;Moist Heat;Therapeutic exercise;Therapeutic activities;Neuromuscular re-education;Patient/family education;Manual techniques;Passive range of motion;Vasopneumatic Device;Joint Manipulations;Dry needling;Taping   ? PT Next Visit Plan rehab per UE ORIF rehab protocol - post-op week #4 as of 01/25/22 (Sx 12/28/21)   ? PT Home Exercise Plan Access Code: [email or web address]   ? Consulted and Agree with Plan of Care Patient   ? ?  ?  ? ?  ? ? ?Patient will benefit from skilled therapeutic intervention in order to improve the following deficits and impairments:  Decreased activity tolerance, Decreased knowledge of precautions, Decreased mobility, Decreased range of motion, Decreased strength, Increased edema, Impaired perceived functional ability, Impaired UE functional use, Improper body mechanics, Postural dysfunction, Pain, Decreased skin integrity, Decreased scar mobility ? ?Visit Diagnosis: ?Pain of left upper arm ? ?Acute pain of left shoulder ? ?Stiffness of left shoulder, not elsewhere classified ? ?Muscle weakness (generalized) ? ?Abnormal posture ? ? ? ? ?Problem List ?Patient Active Problem List  ? Diagnosis Date Noted  ? Humeral shaft fracture 12/28/2021  ? Closed displaced comminuted fracture of shaft of left humerus   ? ? ?12/30/2021, PT ?01/27/2022, 12:33 PM ? ?[place] [facility] ?[address] ?[place], [place], [place] ?Phone: [phone]   Fax:  [phone] ? ?Name: Caitlin Odonnell ?MRN: [id number] ?Date of Birth: 05/21/1956 ? ? ? ?

## 2022-01-27 NOTE — Patient Instructions (Signed)
Access Code: [email or web address] ?URL: [email or web address] ?Date: 01/27/2022 ?Prepared by: Glenetta Hew ? ?Exercises ?- Seated Scapular Retraction  - 2-3 x daily - 7 x weekly - 2 sets - 10 reps - 5 sec hold ?- Shoulder Rolls in Sitting  - 2-3 x daily - 7 x weekly - 2 sets - 10 reps ?- Wrist Extension AROM  - 2-3 x daily - 7 x weekly - 2 sets - 10 reps - 3 sec hold ?- Seated Wrist Flexion AROM  - 2-3 x daily - 7 x weekly - 2 sets - 10 reps - 3 sec hold ?- Seated Forearm Pronation Supination AROM  - 2-3 x daily - 7 x weekly - 2 sets - 10 reps - 3 sec hold ?- Seated Shoulder Flexion AAROM with Pulley Behind  - 1 x daily - 7 x weekly - 2 sets - 10 reps - 3 sec hold ?- Seated Shoulder Abduction AAROM with Pulley Behind  - 1 x daily - 7 x weekly - 2 sets - 10 reps - 3 sec hold ?- Seated Shoulder Flexion Towel Slide at Table Top Full Range of Motion  - 1 x daily - 7 x weekly - 2 sets - 10 reps - 3 sec hold ?- Standing Single Arm Shoulder Flexion Towel Slide at Table Top  - 1 x daily - 7 x weekly - 2 sets - 10 reps - 3 sec hold ?- Seated Shoulder Abduction Towel Slide at Table Top  - 1 x daily - 7 x weekly - 2 sets - 10 reps - 3 sec hold ?- Shoulder Abduction Towel Slide at Table Top  - 1 x daily - 7 x weekly - 2 sets - 10 reps - 3 sec hold ?- Seated Flexion Stretch with Swiss Ball  - 2-3 x daily - 7 x weekly - 3 reps - 30 sec hold ?- Standing Bilateral Low Shoulder Row with Anchored Resistance  - 1 x daily - 7 x weekly - 2 sets - 10 reps - 5 sec hold ?- Seated Gentle Upper Trapezius Stretch  - 2-3 x daily - 7 x weekly - 3 reps - 30 sec hold ?- Seated Cervical Sidebending Stretch  - 2-3 x daily - 7 x weekly - 3 reps - 30 sec hold ?- Seated Levator Scapulae Stretch  - 1 x daily - 7 x weekly - 3 reps - 30 sec hold ?- Isometric Shoulder Internal Rotation  - 1 x daily - 7 x weekly - 2 sets - 5 reps - 5 sec hold ?- Isometric Shoulder External Rotation  - 1 x daily - 7 x weekly - 2 sets - 5 reps - 5 sec hold ?- Isometric  Shoulder Flexion at Wall  - 1 x daily - 7 x weekly - 2 sets - 5 reps - 5 sec hold ?- Isometric Shoulder Abduction at Wall  - 1 x daily - 7 x weekly - 2 sets - 5 reps - 5 sec hold ?- Isometric Shoulder Extension at Wall  - 1 x daily - 7 x weekly - 2 sets - 5 reps - 5 sec hold ?- Seated Shoulder Flexion Extension AAROM with Dowel into Wall  - 1 x daily - 7 x weekly - 2 sets - 10 reps - 3 sec hold ?- Seated Shoulder Circles AAROM with Dowel into Wall  - 1 x daily - 7 x weekly - 2 sets - 10 reps - 3 sec hold ? ?Patient Education ?- TENS  Unit ?- TENS Therapy ?- Trigger Point Dry Needling ?

## 2022-01-29 ENCOUNTER — Ambulatory Visit: Payer: Medicare Other

## 2022-01-29 DIAGNOSIS — M6281 Muscle weakness (generalized): Secondary | ICD-10-CM

## 2022-01-29 DIAGNOSIS — M79622 Pain in left upper arm: Secondary | ICD-10-CM | POA: Diagnosis not present

## 2022-01-29 DIAGNOSIS — M25512 Pain in left shoulder: Secondary | ICD-10-CM

## 2022-01-29 DIAGNOSIS — R293 Abnormal posture: Secondary | ICD-10-CM

## 2022-01-29 DIAGNOSIS — M25612 Stiffness of left shoulder, not elsewhere classified: Secondary | ICD-10-CM

## 2022-01-29 NOTE — Therapy (Signed)
[place] [facility] ?[address] ?[place], [place], [address] ?Phone: [phone]   Fax:  [phone] ? ?Physical Therapy Treatment ? ?Patient Details  ?Name: Caitlin Odonnell ?MRN: [id number] ?Date of Birth: [birth year] ?Referring Provider (PT): Vanetta Mulders, MD ? ? ?Encounter Date: 01/29/2022 ? ? PT End of Session - 01/29/22 0846   ? ? Visit Number 5   ? Number of Visits 24   ? Date for PT Re-Evaluation 04/09/22   ? Authorization Type UHC Medicare   ? PT Start Time 0800   ? PT Stop Time 0855   ? PT Time Calculation (min) 55 min   ? Activity Tolerance Patient tolerated treatment well   ? Behavior During Therapy [facility] for tasks assessed/performed   ? ?  ?  ? ?  ? ? ?Past Medical History:  ?Diagnosis Date  ? Arthritis   ? Complication of anesthesia   ? as a child vomitted after tonsils removed  ? Diabetes mellitus without complication ([place])   ? Hypertension   ? Neuropathy   ? ? ?Past Surgical History:  ?Procedure Laterality Date  ? APPENDECTOMY    ? CATARACT EXTRACTION W/ INTRAOCULAR LENS IMPLANT    ? CESAREAN SECTION    ? INNER EAR SURGERY    ? ORIF HUMERUS FRACTURE Left 12/28/2021  ? Procedure: LEFT OPEN REDUCTION INTERNAL FIXATION (ORIF) HUMERAL SHAFT FRACTURE;  Surgeon: Vanetta Mulders, MD;  Location: [facility];  Service: Orthopedics;  Laterality: Left;  ? TONSILLECTOMY    ? ? ?There were no vitals filed for this visit. ? ? Subjective Assessment - 01/29/22 0802   ? ? Subjective "My arm is hurting today right along the inside."   ? Pertinent History Closed displaced comminuted fracture of shaft of left humerus 07/25/21 s/p ORIF on 12/28/21   ? Diagnostic tests 09/22/21 - L humerus x-ray: Healing midshaft humeral fracture in unchanged alignment. Interval peripheral callus formation.   ? Patient Stated Goals "to get my arm working like normal"   ? Currently in Pain? Yes   ? Pain Score 7    ? Pain Location Arm   ? Pain Orientation Left;Medial   ? Pain Descriptors / Indicators  Aching   ? Pain Type Surgical pain   ? ?  ?  ? ?  ? ? ? ? ? ? ? ? ? ? ? ? ? ? ? ? ? ? ? ? [place] Adult PT Treatment/Exercise - 01/29/22 0001   ? ?  ? Shoulder Exercises: Supine  ? Flexion AAROM;10 reps   ? Flexion Limitations prayer exercise, short lever into flexion   ?  ? Shoulder Exercises: Standing  ? Other Standing Exercises swifer into flexion and scaption with staggered stance x 10 each way   ? Other Standing Exercises prayer into flexion with hands supported on wall x 10   ?  ? Shoulder Exercises: Pulleys  ? Flexion 3 minutes   ? Flexion Limitations to tolerance   ? Scaption 3 minutes   ? Scaption Limitations to tolerance   ?  ? Shoulder Exercises: Therapy Ball  ? Flexion Left;10 reps   ? Flexion Limitations Green Pball rollout just the arm   ? Scaption Left;10 reps   ? Scaption Limitations Green Pball rollout just the arm   ?  ? Shoulder Exercises: Isometric Strengthening  ? Flexion 5X5"   ? Flexion Limitations 60% effort   ? Extension 5X5"   ? Extension Limitations 60% effort   ?  External Rotation 5X5"   ? External Rotation Limitations 60% effort   ?  ? Vasopneumatic  ? Number Minutes Vasopneumatic  10 minutes   ? Vasopnuematic Location  Shoulder   ? Vasopneumatic Pressure Low   ? Vasopneumatic Temperature  34?   ?  ? Manual Therapy  ? Manual Therapy Soft tissue mobilization;Passive ROM   ? Soft tissue mobilization STM to R biceps and ant deltoid; scar tissue mobilization   ? Passive ROM L shoulder - gentle all planes to tolerance   ? ?  ?  ? ?  ? ? ? ? ? ? ? ? ? ? ? ? PT Short Term Goals - 01/29/22 0805   ? ?  ? PT SHORT TERM GOAL #1  ? Title Patient will be independent with initial HEP   ? Status Achieved   01/29/22  ? Target Date 02/05/22   ?  ? PT SHORT TERM GOAL #2  ? Title Improve posture and alignment with patient to demonstrate improved upright posture with posterior shoulder girdle engaged   ? Status On-going   ? Target Date 02/26/22   ?  ? PT SHORT TERM GOAL #3  ? Title Patient to improve L shoulder  AROM to >/= 90? flexion and scaption/abduction to increase functional L UE use   ? Status On-going   ? Target Date 02/26/22   ? ?  ?  ? ?  ? ? ? ? PT Long Term Goals - 01/19/22 0955   ? ?  ? PT LONG TERM GOAL #1  ? Title Patient will be independent with ongoing/advanced HEP for self-management at home in order to build upon functional gains in therapy   ? Status On-going   ? Target Date 04/09/22   ?  ? PT LONG TERM GOAL #2  ? Title Decrease pain in the L shoulder/proximal UE by >/= 50% allowing patient to use L UE for functional activities   ? Status On-going   ? Target Date 04/09/22   ?  ? PT LONG TERM GOAL #3  ? Title Patient to improve L shoulder AROM to [facility] without pain provocation   ? Status On-going   ? Target Date 04/09/22   ?  ? PT LONG TERM GOAL #4  ? Title Patient will demonstrate improved L shoulder strength to >/= 4/5 for functional UE use   ? Status On-going   ? Target Date 04/09/22   ?  ? PT LONG TERM GOAL #5  ? Title Patient to report ability to perform ADLs, household, and work-related tasks without limitation due to L UE/shoulder pain, LOM or weakness   ? Status On-going   ? Target Date 04/09/22   ? ?  ?  ? ?  ? ? ? ? ? ? ? ? Plan - 01/29/22 0847   ? ? Clinical Impression Statement Pt entered clinic with reports of increased pain today. Started with MT to deccrease pain and stiffness in the L shoulder. Cuing given to depress shoulder with overhead flexion. Increased the muscular effort with isometrics. Assured pt that the increase in pain could be from the progression of exercises as now she is in phase 2 of rehab. Ended session with GR to address pain and soreness.   ? Personal Factors and Comorbidities Comorbidity 2;Past/Current Experience;Time since onset of injury/illness/exacerbation;Fitness   ? Comorbidities DM, HTN   ? PT Frequency 2x / week   ? PT Duration 12 weeks   ? PT Treatment/Interventions ADLs/Self Care  Home Management;Cryotherapy;Electrical Stimulation;Iontophoresis 4mg /ml  Dexamethasone;Moist Heat;Therapeutic exercise;Therapeutic activities;Neuromuscular re-education;Patient/family education;Manual techniques;Passive range of motion;Vasopneumatic Device;Joint Manipulations;Dry needling;Taping   ? PT Next Visit Plan rehab per UE ORIF rehab protocol - post-op week #4 as of 02/01/22 (Sx 12/28/21)   ? PT Home Exercise Plan Access Code: [email or web address]   ? Consulted and Agree with Plan of Care Patient   ? ?  ?  ? ?  ? ? ?Patient will benefit from skilled therapeutic intervention in order to improve the following deficits and impairments:  Decreased activity tolerance, Decreased knowledge of precautions, Decreased mobility, Decreased range of motion, Decreased strength, Increased edema, Impaired perceived functional ability, Impaired UE functional use, Improper body mechanics, Postural dysfunction, Pain, Decreased skin integrity, Decreased scar mobility ? ?Visit Diagnosis: ?Pain of left upper arm ? ?Acute pain of left shoulder ? ?Stiffness of left shoulder, not elsewhere classified ? ?Muscle weakness (generalized) ? ?Abnormal posture ? ? ? ? ?Problem List ?Patient Active Problem List  ? Diagnosis Date Noted  ? Humeral shaft fracture 12/28/2021  ? Closed displaced comminuted fracture of shaft of left humerus   ? ? ?Artist Pais, PTA ?01/29/2022, 10:32 AM ? ?[place] ?[facility] ?[address] ?[place], [place], [address] ?Phone: [phone]   Fax:  [phone] ? ?Name: Caitlin Odonnell ?MRN: [id number] ?Date of Birth: 09-Feb-1956 ? ? ? ?

## 2022-02-02 ENCOUNTER — Ambulatory Visit: Payer: Medicare Other | Attending: Orthopaedic Surgery | Admitting: Physical Therapy

## 2022-02-02 ENCOUNTER — Encounter: Payer: Self-pay | Admitting: Physical Therapy

## 2022-02-02 DIAGNOSIS — M25512 Pain in left shoulder: Secondary | ICD-10-CM | POA: Insufficient documentation

## 2022-02-02 DIAGNOSIS — M79622 Pain in left upper arm: Secondary | ICD-10-CM | POA: Diagnosis present

## 2022-02-02 DIAGNOSIS — R293 Abnormal posture: Secondary | ICD-10-CM | POA: Insufficient documentation

## 2022-02-02 DIAGNOSIS — M25612 Stiffness of left shoulder, not elsewhere classified: Secondary | ICD-10-CM | POA: Insufficient documentation

## 2022-02-02 DIAGNOSIS — M6281 Muscle weakness (generalized): Secondary | ICD-10-CM | POA: Diagnosis present

## 2022-02-02 NOTE — Therapy (Signed)
[place] ?[facility] ?[place] ?[place], [place], [address] ?Phone: [phone]   Fax:  [phone] ? ?Physical Therapy Treatment ? ?Patient Details  ?Name: Caitlin Odonnell ?MRN: [id number] ?Date of Birth: [birth year] ?Referring Provider (PT): Vanetta Mulders, MD ? ? ?Encounter Date: 02/02/2022 ? ? PT End of Session - 02/02/22 0802   ? ? Visit Number 6   ? Number of Visits 24   ? Date for PT Re-Evaluation 04/09/22   ? Authorization Type UHC Medicare   ? PT Start Time 0802   ? PT Stop Time 0855   ? PT Time Calculation (min) 53 min   ? Activity Tolerance Patient tolerated treatment well   ? Behavior During Therapy [facility] for tasks assessed/performed   ? ?  ?  ? ?  ? ? ?Past Medical History:  ?Diagnosis Date  ? Arthritis   ? Complication of anesthesia   ? as a child vomitted after tonsils removed  ? Diabetes mellitus without complication ([place])   ? Hypertension   ? Neuropathy   ? ? ?Past Surgical History:  ?Procedure Laterality Date  ? APPENDECTOMY    ? CATARACT EXTRACTION W/ INTRAOCULAR LENS IMPLANT    ? CESAREAN SECTION    ? INNER EAR SURGERY    ? ORIF HUMERUS FRACTURE Left 12/28/2021  ? Procedure: LEFT OPEN REDUCTION INTERNAL FIXATION (ORIF) HUMERAL SHAFT FRACTURE;  Surgeon: Vanetta Mulders, MD;  Location: [facility];  Service: Orthopedics;  Laterality: Left;  ? TONSILLECTOMY    ? ? ?There were no vitals filed for this visit. ? ? Subjective Assessment - 02/02/22 0806   ? ? Subjective Pt reports her arm still hurts all the time, 4/10 at rest and worse with activity.   ? Pertinent History Closed displaced comminuted fracture of shaft of left humerus 07/25/21 s/p ORIF on 12/28/21   ? Patient Stated Goals "to get my arm working like normal"   ? Currently in Pain? Yes   ? Pain Score 4    at rest  ? Pain Location Arm   ? Pain Orientation Left;Upper   ? Pain Descriptors / Indicators Aching   ? Pain Type Surgical pain   ? ?  ?  ? ?  ? ? ? ? ? ? ? ? ? ? ? ? ? ? ? ? ? ? ? ? [place] Adult PT  Treatment/Exercise - 02/02/22 0802   ? ?  ? Shoulder Exercises: Pulleys  ? Flexion 3 minutes   ? Flexion Limitations to tolerance   ? Scaption 3 minutes   ? Scaption Limitations to tolerance   ?  ? Vasopneumatic  ? Number Minutes Vasopneumatic  10 minutes   ? Vasopnuematic Location  Shoulder   Lt  ? Vasopneumatic Pressure Low   ? Vasopneumatic Temperature  34?   ?  ? Manual Therapy  ? Manual Therapy Joint mobilization;Soft tissue mobilization;Passive ROM   ? Joint Mobilization grade I-II l shoulder oscillations & CW/CCW circles for pain relief; grade II-III inferior mobs   ? Soft tissue mobilization STM/IASTM to L biceps and ant deltoid; Incisional scar massage   ? Myofascial Release manual TPR and pin & stretch to L biceps   ? Passive ROM L shoulder - all planes to tolerance but not exeeding 90?   ? ?  ?  ? ?  ? ? ? ? ? ? ? ? ? ? ? ? PT Short Term Goals - 01/29/22 0805   ? ?  ?  PT SHORT TERM GOAL #1  ? Title Patient will be independent with initial HEP   ? Status Achieved   01/29/22  ? Target Date 02/05/22   ?  ? PT SHORT TERM GOAL #2  ? Title Improve posture and alignment with patient to demonstrate improved upright posture with posterior shoulder girdle engaged   ? Status On-going   ? Target Date 02/26/22   ?  ? PT SHORT TERM GOAL #3  ? Title Patient to improve L shoulder AROM to >/= 90? flexion and scaption/abduction to increase functional L UE use   ? Status On-going   ? Target Date 02/26/22   ? ?  ?  ? ?  ? ? ? ? PT Long Term Goals - 01/19/22 0955   ? ?  ? PT LONG TERM GOAL #1  ? Title Patient will be independent with ongoing/advanced HEP for self-management at home in order to build upon functional gains in therapy   ? Status On-going   ? Target Date 04/09/22   ?  ? PT LONG TERM GOAL #2  ? Title Decrease pain in the L shoulder/proximal UE by >/= 50% allowing patient to use L UE for functional activities   ? Status On-going   ? Target Date 04/09/22   ?  ? PT LONG TERM GOAL #3  ? Title Patient to improve L  shoulder AROM to [facility] without pain provocation   ? Status On-going   ? Target Date 04/09/22   ?  ? PT LONG TERM GOAL #4  ? Title Patient will demonstrate improved L shoulder strength to >/= 4/5 for functional UE use   ? Status On-going   ? Target Date 04/09/22   ?  ? PT LONG TERM GOAL #5  ? Title Patient to report ability to perform ADLs, household, and work-related tasks without limitation due to L UE/shoulder pain, LOM or weakness   ? Status On-going   ? Target Date 04/09/22   ? ?  ?  ? ?  ? ? ? ? ? ? ? ? Plan - 02/02/22 0809   ? ? Clinical Impression Statement Caitlin Odonnell reports increased L shoulder/upper arm pain over that past week limiting tolerance for pulleys and other exercises as well as functional use of L arm. Repeatedly cautioned pt on avoiding pushing into painful ROM/activity as pt frequently verbalizing ?no pain - no gain? mantra. Significant increased muscle tension and TTP evident in L medial upper arm and anterior shoulder, especially in biceps. Limited tolerance for MT as pt flinching with even light touch in some areas but pt not interested in trying DN again as she felt like it did not help on previous attempt, therefore proceeded with STM and IASTM to pt tolerance along with joint mobs to promote reduced pain and muscle guarding - pt noting some benefit with decreased pain by end of session.   ? Comorbidities DM, HTN   ? Rehab Potential Good   ? PT Frequency 2x / week   ? PT Duration 12 weeks   ? PT Treatment/Interventions ADLs/Self Care Home Management;Cryotherapy;Electrical Stimulation;Iontophoresis 4mg /ml Dexamethasone;Moist Heat;Therapeutic exercise;Therapeutic activities;Neuromuscular re-education;Patient/family education;Manual techniques;Passive range of motion;Vasopneumatic Device;Joint Manipulations;Dry needling;Taping   ? PT Next Visit Plan rehab per UE ORIF rehab protocol - post-op week #5 as of 02/01/22 (Sx 12/28/21)   ? PT Home Exercise Plan Access Code: [email or web address]   ? Consulted and Agree  with Plan of Care Patient   ? ?  ?  ? ?  ? ? ?  Patient will benefit from skilled therapeutic intervention in order to improve the following deficits and impairments:  Decreased activity tolerance, Decreased knowledge of precautions, Decreased mobility, Decreased range of motion, Decreased strength, Increased edema, Impaired perceived functional ability, Impaired UE functional use, Improper body mechanics, Postural dysfunction, Pain, Decreased skin integrity, Decreased scar mobility ? ?Visit Diagnosis: ?Pain of left upper arm ? ?Acute pain of left shoulder ? ?Stiffness of left shoulder, not elsewhere classified ? ?Muscle weakness (generalized) ? ?Abnormal posture ? ? ? ? ?Problem List ?Patient Active Problem List  ? Diagnosis Date Noted  ? Humeral shaft fracture 12/28/2021  ? Closed displaced comminuted fracture of shaft of left humerus   ? ? ?Percival Spanish, PT ?02/02/2022, 12:15 PM ? ?[place] ?[facility] ?[place] ?[place], [place], [address] ?Phone: [phone]   Fax:  [phone] ? ?Name: Caitlin Odonnell ?MRN: [id number] ?Date of Birth: 10/23/1956 ? ? ? ?

## 2022-02-10 ENCOUNTER — Ambulatory Visit: Payer: Medicare Other

## 2022-02-10 DIAGNOSIS — M79622 Pain in left upper arm: Secondary | ICD-10-CM | POA: Diagnosis not present

## 2022-02-10 DIAGNOSIS — M6281 Muscle weakness (generalized): Secondary | ICD-10-CM

## 2022-02-10 DIAGNOSIS — R293 Abnormal posture: Secondary | ICD-10-CM

## 2022-02-10 DIAGNOSIS — M25512 Pain in left shoulder: Secondary | ICD-10-CM

## 2022-02-10 DIAGNOSIS — M25612 Stiffness of left shoulder, not elsewhere classified: Secondary | ICD-10-CM

## 2022-02-10 NOTE — Therapy (Signed)
[place] ?[facility] ?[place] ?[place], [place], [address] ?Phone: [phone]   Fax:  [phone] ? ?Physical Therapy Treatment ? ?Patient Details  ?Name: Caitlin Odonnell ?MRN: [id number] ?Date of Birth: 06-12-1956 ?Referring Provider (PT): Vanetta Mulders, MD ? ? ?Encounter Date: 02/10/2022 ? ? PT End of Session - 02/10/22 0859   ? ? Visit Number 7   ? Number of Visits 24   ? Date for PT Re-Evaluation 04/09/22   ? Authorization Type UHC Medicare   ? PT Start Time 0805   ? PT Stop Time 0850   ? PT Time Calculation (min) 45 min   ? Activity Tolerance Patient tolerated treatment well   ? Behavior During Therapy [facility] for tasks assessed/performed   ? ?  ?  ? ?  ? ? ?Past Medical History:  ?Diagnosis Date  ? Arthritis   ? Complication of anesthesia   ? as a child vomitted after tonsils removed  ? Diabetes mellitus without complication ([place])   ? Hypertension   ? Neuropathy   ? ? ?Past Surgical History:  ?Procedure Laterality Date  ? APPENDECTOMY    ? CATARACT EXTRACTION W/ INTRAOCULAR LENS IMPLANT    ? CESAREAN SECTION    ? INNER EAR SURGERY    ? ORIF HUMERUS FRACTURE Left 12/28/2021  ? Procedure: LEFT OPEN REDUCTION INTERNAL FIXATION (ORIF) HUMERAL SHAFT FRACTURE;  Surgeon: Vanetta Mulders, MD;  Location: [facility];  Service: Orthopedics;  Laterality: Left;  ? TONSILLECTOMY    ? ? ?There were no vitals filed for this visit. ? ? Subjective Assessment - 02/10/22 0809   ? ? Subjective Pt reports just having pain on the front and inside arm.   ? Pertinent History Closed displaced comminuted fracture of shaft of left humerus 07/25/21 s/p ORIF on 12/28/21   ? Diagnostic tests 09/22/21 - L humerus x-ray: Healing midshaft humeral fracture in unchanged alignment. Interval peripheral callus formation.   ? Patient Stated Goals "to get my arm working like normal"   ? Currently in Pain? Yes   ? Pain Score 7    ? Pain Location Arm   ? Pain Orientation Left   ? Pain Descriptors / Indicators  Aching   ? Pain Type Surgical pain   ? ?  ?  ? ?  ? ? ? ? ? [facility] PT Assessment - 02/10/22 0001   ? ?  ? AROM  ? Overall AROM Comments seated   ? Left Shoulder Flexion 64 Degrees   ? Left Shoulder ABduction 58 Degrees   ? ?  ?  ? ?  ? ? ? ? ? ? ? ? ? ? ? ? ? ? ? ? [place] Adult PT Treatment/Exercise - 02/10/22 0001   ? ?  ? Lumbar Exercises: Seated  ? Other Seated Lumbar Exercises thoracic extension over chair with arms in flexion x 20 reps   ?  ? Shoulder Exercises: Pulleys  ? Flexion 3 minutes   ? Flexion Limitations to tolerance   ? Scaption 3 minutes   ? Scaption Limitations to tolerance   ?  ? Shoulder Exercises: ROM/Strengthening  ? [place] B UE into flexion x12 with thoracic ext   ?  ? Manual Therapy  ? Manual Therapy Soft tissue mobilization;Myofascial release;Passive ROM   ? Soft tissue mobilization STM to L biceps and ant deltoid; Incisional scar massage   ? Myofascial Release manual TPR to L biceps   ? Passive  ROM L shoulder - all planes to tolerance   ? ?  ?  ? ?  ? ? ? ? ? ? ? ? ? ? ? ? PT Short Term Goals - 01/29/22 0805   ? ?  ? PT SHORT TERM GOAL #1  ? Title Patient will be independent with initial HEP   ? Status Achieved   01/29/22  ? Target Date 02/05/22   ?  ? PT SHORT TERM GOAL #2  ? Title Improve posture and alignment with patient to demonstrate improved upright posture with posterior shoulder girdle engaged   ? Status On-going   ? Target Date 02/26/22   ?  ? PT SHORT TERM GOAL #3  ? Title Patient to improve L shoulder AROM to >/= 90? flexion and scaption/abduction to increase functional L UE use   ? Status On-going   ? Target Date 02/26/22   ? ?  ?  ? ?  ? ? ? ? PT Long Term Goals - 01/19/22 0955   ? ?  ? PT LONG TERM GOAL #1  ? Title Patient will be independent with ongoing/advanced HEP for self-management at home in order to build upon functional gains in therapy   ? Status On-going   ? Target Date 04/09/22   ?  ? PT LONG TERM GOAL #2  ? Title Decrease pain in the L shoulder/proximal UE by >/=  50% allowing patient to use L UE for functional activities   ? Status On-going   ? Target Date 04/09/22   ?  ? PT LONG TERM GOAL #3  ? Title Patient to improve L shoulder AROM to [facility] without pain provocation   ? Status On-going   ? Target Date 04/09/22   ?  ? PT LONG TERM GOAL #4  ? Title Patient will demonstrate improved L shoulder strength to >/= 4/5 for functional UE use   ? Status On-going   ? Target Date 04/09/22   ?  ? PT LONG TERM GOAL #5  ? Title Patient to report ability to perform ADLs, household, and work-related tasks without limitation due to L UE/shoulder pain, LOM or weakness   ? Status On-going   ? Target Date 04/09/22   ? ?  ?  ? ?  ? ? ? ? ? ? ? ? Plan - 02/10/22 0900   ? ? Clinical Impression Statement Pt continues with c/o pain along the anteromedial portion of the L shoulder. Denies pushing HEP to painful ROM but cuing throughout session needed to avoid pain, as seen with facial grimacing. Still shows TTP in L biceps and anterior deltoid. Difficulty is shown with functional reaching as pt is only able to reach below shld height into flexion and abduction. Pt may benefit from working on thoracic ext and mobility to help with normal mechanics of the scapula and shoulder complex.   ? Personal Factors and Comorbidities Comorbidity 2;Past/Current Experience;Time since onset of injury/illness/exacerbation;Fitness   ? Comorbidities DM, HTN   ? PT Frequency 2x / week   ? PT Duration 12 weeks   ? PT Treatment/Interventions ADLs/Self Care Home Management;Cryotherapy;Electrical Stimulation;Iontophoresis 4mg /ml Dexamethasone;Moist Heat;Therapeutic exercise;Therapeutic activities;Neuromuscular re-education;Patient/family education;Manual techniques;Passive range of motion;Vasopneumatic Device;Joint Manipulations;Dry needling;Taping   ? PT Next Visit Plan rehab per UE ORIF rehab protocol - post-op week #6 as of 02/08/22 (Sx 12/28/21)   ? PT Home Exercise Plan Access Code: [email or web address]   ? Consulted and Agree with  Plan of Care Patient   ? ?  ?  ? ?  ? ? ?  Patient will benefit from skilled therapeutic intervention in order to improve the following deficits and impairments:  Decreased activity tolerance, Decreased knowledge of precautions, Decreased mobility, Decreased range of motion, Decreased strength, Increased edema, Impaired perceived functional ability, Impaired UE functional use, Improper body mechanics, Postural dysfunction, Pain, Decreased skin integrity, Decreased scar mobility ? ?Visit Diagnosis: ?Pain of left upper arm ? ?Acute pain of left shoulder ? ?Stiffness of left shoulder, not elsewhere classified ? ?Muscle weakness (generalized) ? ?Abnormal posture ? ? ? ? ?Problem List ?Patient Active Problem List  ? Diagnosis Date Noted  ? Humeral shaft fracture 12/28/2021  ? Closed displaced comminuted fracture of shaft of left humerus   ? ? ?Artist Pais, PTA ?02/10/2022, 9:08 AM ? ?[place] ?[facility] ?[place] ?[place], [place], [address] ?Phone: [phone]   Fax:  [phone] ? ?Name: Caitlin Odonnell ?MRN: [id number] ?Date of Birth: August 07, 1956 ? ? ? ?

## 2022-02-15 ENCOUNTER — Encounter (HOSPITAL_BASED_OUTPATIENT_CLINIC_OR_DEPARTMENT_OTHER): Payer: Medicare Other | Admitting: Orthopaedic Surgery

## 2022-02-17 ENCOUNTER — Ambulatory Visit: Payer: Medicare Other

## 2022-02-17 DIAGNOSIS — M79622 Pain in left upper arm: Secondary | ICD-10-CM

## 2022-02-17 DIAGNOSIS — M25512 Pain in left shoulder: Secondary | ICD-10-CM

## 2022-02-17 DIAGNOSIS — M25612 Stiffness of left shoulder, not elsewhere classified: Secondary | ICD-10-CM

## 2022-02-17 DIAGNOSIS — R293 Abnormal posture: Secondary | ICD-10-CM

## 2022-02-17 DIAGNOSIS — M6281 Muscle weakness (generalized): Secondary | ICD-10-CM

## 2022-02-17 NOTE — Therapy (Signed)
[place] ?[facility] ?[place] ?[place], [place], [address] ?Phone: [phone]   Fax:  [phone] ? ?Physical Therapy Treatment ? ?Patient Details  ?Name: Caitlin Odonnell ?MRN: [id number] ?Date of Birth: 06-09-1956 ?Referring Provider (PT): Vanetta Mulders, MD ? ? ?Encounter Date: 02/17/2022 ? ? PT End of Session - 02/17/22 0847   ? ? Visit Number 8   ? Number of Visits 24   ? Date for PT Re-Evaluation 04/09/22   ? Authorization Type UHC Medicare   ? PT Start Time 0801   ? PT Stop Time 0856   ? PT Time Calculation (min) 55 min   ? Activity Tolerance Patient tolerated treatment well   ? Behavior During Therapy [facility] for tasks assessed/performed   ? ?  ?  ? ?  ? ? ?Past Medical History:  ?Diagnosis Date  ? Arthritis   ? Complication of anesthesia   ? as a child vomitted after tonsils removed  ? Diabetes mellitus without complication ([place])   ? Hypertension   ? Neuropathy   ? ? ?Past Surgical History:  ?Procedure Laterality Date  ? APPENDECTOMY    ? CATARACT EXTRACTION W/ INTRAOCULAR LENS IMPLANT    ? CESAREAN SECTION    ? INNER EAR SURGERY    ? ORIF HUMERUS FRACTURE Left 12/28/2021  ? Procedure: LEFT OPEN REDUCTION INTERNAL FIXATION (ORIF) HUMERAL SHAFT FRACTURE;  Surgeon: Vanetta Mulders, MD;  Location: [facility];  Service: Orthopedics;  Laterality: Left;  ? TONSILLECTOMY    ? ? ?There were no vitals filed for this visit. ? ? Subjective Assessment - 02/17/22 0805   ? ? Subjective Pt reports having a rough past couple of days.   ? Pertinent History Closed displaced comminuted fracture of shaft of left humerus 07/25/21 s/p ORIF on 12/28/21   ? Patient Stated Goals "to get my arm working like normal"   ? Currently in Pain? Yes   ? Pain Score 6    ? Pain Location Arm   ? Pain Orientation Left   ? Pain Descriptors / Indicators Aching   ? Pain Type Surgical pain   ? ?  ?  ? ?  ? ? ? ? ? [facility] PT Assessment - 02/17/22 0001   ? ?  ? AROM  ? Left Shoulder Flexion 125 Degrees   ? Left  Shoulder ABduction 101 Degrees   ? ?  ?  ? ?  ? ? ? ? ? ? ? ? ? ? ? ? ? ? ? ? [place] Adult PT Treatment/Exercise - 02/17/22 0001   ? ?  ? Shoulder Exercises: Standing  ? External Rotation Strengthening;Left;10 reps;Theraband   ? Theraband Level (Shoulder External Rotation) Level 1 (Yellow)   ? Internal Rotation Strengthening;Left;Theraband   ? Theraband Level (Shoulder Internal Rotation) Level 2 (Red)   ? Internal Rotation Limitations isometric step out   ? Extension Strengthening;Both;Theraband;20 reps   ? Theraband Level (Shoulder Extension) Level 2 (Red)   ? Extension Limitations cuing for shoulder depression and retraction   ? Row Strengthening;Both;20 reps;Theraband   ? Theraband Level (Shoulder Row) Level 2 (Red)   ?  ? Shoulder Exercises: Pulleys  ? Flexion 3 minutes   ? Flexion Limitations to tolerance   ? Scaption 3 minutes   ? Scaption Limitations to tolerance   ?  ? Vasopneumatic  ? Number Minutes Vasopneumatic  10 minutes   ? Vasopnuematic Location  Shoulder   ? Vasopneumatic Pressure Low   ?  Vasopneumatic Temperature  34?   ?  ? Manual Therapy  ? Manual Therapy Joint mobilization;Passive ROM   ? Joint Mobilization grade II-III inf and post glides for shoulder   ? Passive ROM L shoulder - all planes to tolerance   ? ?  ?  ? ?  ? ? ? ? ? ? ? ? ? ? PT Education - 02/17/22 0847   ? ? Education Details HEP update   ? Person(s) Educated Patient   ? Methods Explanation;Demonstration;Handout   ? Comprehension Verbalized understanding;Returned demonstration   ? ?  ?  ? ?  ? ? ? PT Short Term Goals - 02/17/22 0823   ? ?  ? PT SHORT TERM GOAL #1  ? Title Patient will be independent with initial HEP   ? Status Achieved   01/29/22  ? Target Date 02/05/22   ?  ? PT SHORT TERM GOAL #2  ? Title Improve posture and alignment with patient to demonstrate improved upright posture with posterior shoulder girdle engaged   ? Status On-going   ? Target Date 02/26/22   ?  ? PT SHORT TERM GOAL #3  ? Title Patient to improve L  shoulder AROM to >/= 90? flexion and scaption/abduction to increase functional L UE use   ? Status Achieved   02/17/22  ? Target Date 02/26/22   ? ?  ?  ? ?  ? ? ? ? PT Long Term Goals - 01/19/22 0955   ? ?  ? PT LONG TERM GOAL #1  ? Title Patient will be independent with ongoing/advanced HEP for self-management at home in order to build upon functional gains in therapy   ? Status On-going   ? Target Date 04/09/22   ?  ? PT LONG TERM GOAL #2  ? Title Decrease pain in the L shoulder/proximal UE by >/= 50% allowing patient to use L UE for functional activities   ? Status On-going   ? Target Date 04/09/22   ?  ? PT LONG TERM GOAL #3  ? Title Patient to improve L shoulder AROM to [facility] without pain provocation   ? Status On-going   ? Target Date 04/09/22   ?  ? PT LONG TERM GOAL #4  ? Title Patient will demonstrate improved L shoulder strength to >/= 4/5 for functional UE use   ? Status On-going   ? Target Date 04/09/22   ?  ? PT LONG TERM GOAL #5  ? Title Patient to report ability to perform ADLs, household, and work-related tasks without limitation due to L UE/shoulder pain, LOM or weakness   ? Status On-going   ? Target Date 04/09/22   ? ?  ?  ? ?  ? ? ? ? ? ? ? ? Plan - 02/17/22 0847   ? ? Clinical Impression Statement Pt continues to report high pain levels in the L arm. She has met her STG for ROM today with measurements taken in supine. Progressed HEP with light postural strengthening and RTC strengthening, as supine ROM is progressing well but upright ROM still moderately limited. Cuing required with rows/extension for scap retraction and with isometric ER to keep neutral shoulder position. Ended session with GR to address swelling and soreness post exercises.   ? Personal Factors and Comorbidities Comorbidity 2;Past/Current Experience;Time since onset of injury/illness/exacerbation;Fitness   ? Comorbidities DM, HTN   ? PT Frequency 2x / week   ? PT Duration 12 weeks   ? PT  Treatment/Interventions ADLs/Self Care  Home Management;Cryotherapy;Electrical Stimulation;Iontophoresis 3m/ml Dexamethasone;Moist Heat;Therapeutic exercise;Therapeutic activities;Neuromuscular re-education;Patient/family education;Manual techniques;Passive range of motion;Vasopneumatic Device;Joint Manipulations;Dry needling;Taping   ? PT Next Visit Plan rehab per UE ORIF rehab protocol - post-op week #7 as of 02/15/22 (Sx 12/28/21)   ? PT Home Exercise Plan Access Code: [email or web address]  ? Consulted and Agree with Plan of Care Patient   ? ?  ?  ? ?  ? ? ?Patient will benefit from skilled therapeutic intervention in order to improve the following deficits and impairments:  Decreased activity tolerance, Decreased knowledge of precautions, Decreased mobility, Decreased range of motion, Decreased strength, Increased edema, Impaired perceived functional ability, Impaired UE functional use, Improper body mechanics, Postural dysfunction, Pain, Decreased skin integrity, Decreased scar mobility ? ?Visit Diagnosis: ?Pain of left upper arm ? ?Acute pain of left shoulder ? ?Stiffness of left shoulder, not elsewhere classified ? ?Muscle weakness (generalized) ? ?Abnormal posture ? ? ? ? ?Problem List ?Patient Active Problem List  ? Diagnosis Date Noted  ? Humeral shaft fracture 12/28/2021  ? Closed displaced comminuted fracture of shaft of left humerus   ? ? ?BArtist Pais PTA ?02/17/2022, 9:46 AM ? ?[place] ?[facility] ?[address] [address]?Phone: [phone]  Fax:  [phone]? ?Name: MShannon Balthazar?MRN: [id number]?Date of Birth: 1Aug 15, 1957? ? ? ?

## 2022-02-17 NOTE — Patient Instructions (Signed)
Access Code: [email or web address] ?URL: [email or web address] ?Date: 02/17/2022 ?Prepared by: Verta Ellen ? ?Exercises ?- Seated Shoulder Abduction AAROM with Pulley Behind  - 1 x daily - 7 x weekly - 2 sets - 10 reps - 3 sec hold ?- Seated Shoulder Flexion Towel Slide at Table Top Full Range of Motion  - 1 x daily - 7 x weekly - 2 sets - 10 reps - 3 sec hold ?- Seated Shoulder Abduction Towel Slide at Table Top  - 1 x daily - 7 x weekly - 2 sets - 10 reps - 3 sec hold ?- Standing Bilateral Low Shoulder Row with Anchored Resistance  - 1 x daily - 3 x weekly - 2 sets - 10 reps - 5 sec hold ?- Seated Gentle Upper Trapezius Stretch  - 2-3 x daily - 7 x weekly - 3 reps - 30 sec hold ?- Seated Cervical Sidebending Stretch  - 2-3 x daily - 7 x weekly - 3 reps - 30 sec hold ?- Seated Levator Scapulae Stretch  - 1 x daily - 7 x weekly - 3 reps - 30 sec hold ?- Seated Shoulder Flexion Extension AAROM with Dowel into Wall  - 1 x daily - 7 x weekly - 2 sets - 10 reps - 3 sec hold ?- Seated Shoulder Circles AAROM with Dowel into Wall  - 1 x daily - 7 x weekly - 2 sets - 10 reps - 3 sec hold ?- Shoulder Extension with Resistance  - 1 x daily - 3 x weekly - 2 sets - 10 reps ?- Shoulder external rotation  - 1 x daily - 3 x weekly - 3 sets - 10 reps - 3 sec hold ?- Shoulder Internal Rotation Reactive Isometrics  - 1 x daily - 3 x weekly - 3 sets - 10 reps - 3 sec hold ? ?Patient Education ?- TENS Unit ?- TENS Therapy ?- Trigger Point Dry Needling ?

## 2022-02-22 ENCOUNTER — Encounter: Payer: Self-pay | Admitting: Physical Therapy

## 2022-02-22 ENCOUNTER — Ambulatory Visit: Payer: Medicare Other | Admitting: Physical Therapy

## 2022-02-22 DIAGNOSIS — M79622 Pain in left upper arm: Secondary | ICD-10-CM | POA: Diagnosis not present

## 2022-02-22 DIAGNOSIS — M25512 Pain in left shoulder: Secondary | ICD-10-CM

## 2022-02-22 DIAGNOSIS — R293 Abnormal posture: Secondary | ICD-10-CM

## 2022-02-22 DIAGNOSIS — M25612 Stiffness of left shoulder, not elsewhere classified: Secondary | ICD-10-CM

## 2022-02-22 DIAGNOSIS — M6281 Muscle weakness (generalized): Secondary | ICD-10-CM

## 2022-02-22 NOTE — Therapy (Signed)
[place] [facility] [place] [facility] ?[address] ?[place], [place], [address] ?Phone: [phone]   Fax:  [phone] ? ?Physical Therapy Treatment / Progress Note ? ?Patient Details  ?Name: Caitlin Odonnell ?MRN: [id number] ?Date of Birth: September 20, 1956 ?Referring Provider (PT): Vanetta Mulders, MD ? ?Progress Note ? ?Reporting Period 01/15/2022 to 02/22/2022 ? ?See note below for Objective Data and Assessment of Progress/Goals.  ? ? ? ?Encounter Date: 02/22/2022 ? ? PT End of Session - 02/22/22 0801   ? ? Visit Number 9   ? Number of Visits 24   ? Date for PT Re-Evaluation 04/09/22   ? Authorization Type [employer] Medicare   ? Progress Note Due on Visit 64   MD PN on visit #9 - 02/22/22  ? PT Start Time 0801   ? PT Stop Time 0858   ? PT Time Calculation (min) 57 min   ? Activity Tolerance Patient tolerated treatment well   ? Behavior During Therapy [facility] for tasks assessed/performed   ? ?  ?  ? ?  ? ? ?Past Medical History:  ?Diagnosis Date  ? Arthritis   ? Complication of anesthesia   ? as a child vomitted after tonsils removed  ? Diabetes mellitus without complication ([place])   ? Hypertension   ? Neuropathy   ? ? ?Past Surgical History:  ?Procedure Laterality Date  ? APPENDECTOMY    ? CATARACT EXTRACTION W/ INTRAOCULAR LENS IMPLANT    ? CESAREAN SECTION    ? INNER EAR SURGERY    ? ORIF HUMERUS FRACTURE Left 12/28/2021  ? Procedure: LEFT OPEN REDUCTION INTERNAL FIXATION (ORIF) HUMERAL SHAFT FRACTURE;  Surgeon: Vanetta Mulders, MD;  Location: [facility];  Service: Orthopedics;  Laterality: Left;  ? TONSILLECTOMY    ? ? ?There were no vitals filed for this visit. ? ? Subjective Assessment - 02/22/22 0802   ? ? Subjective Pt reports she can now pick up a glass and almost get it to her mouth - she states she can definitely do more than she could before surgery.   ? Pertinent History Closed displaced comminuted fracture of shaft of left humerus 07/25/21 s/p ORIF on 12/28/21   ? Patient Stated Goals "to get my  arm working like normal"   ? Currently in Pain? Yes   ? Pain Score 4    3-4/10  ? Pain Location Arm   & shoulder  ? Pain Orientation Left;Upper   ? Pain Descriptors / Indicators Aching   ? Pain Type Chronic pain;Surgical pain   ? Pain Frequency Constant   ? ?  ?  ? ?  ? ? ? ? ? [facility] PT Assessment - 02/22/22 0801   ? ?  ? Assessment  ? Medical Diagnosis s/p ORIF for L midshaft humerus fracture   ? Referring Provider (PT) Vanetta Mulders, MD   ? Onset Date/Surgical Date 12/28/21   fracture occurred on 07/25/21  ? Next MD Visit 02/24/22   ?  ? AROM  ? Left Shoulder Flexion 108 Degrees   in sitting (evidence of shoulder hike); 138? in supine  ? Left Shoulder ABduction 74 Degrees   in sitting (evidence of shoulder hike); 102? in supine  ? Left Shoulder Internal Rotation 76 Degrees   ? Left Shoulder External Rotation 51 Degrees   ? ?  ?  ? ?  ? ? ? ? ? ? ? ? ? ? ? ? ? ? ? ? [facility] Adult PT Treatment/Exercise -  02/22/22 0801   ? ?  ? Shoulder Exercises: Seated  ? Flexion Left;10 reps;AROM;AAROM   3 sets  ? Flexion Limitations PT providing verbal& tactile cues to reduce shoulder hike and improve coordination of scapular motion, mirror feedback for visual cues   ? Abduction Left;10 reps;AROM;AAROM   3 sets  ? ABduction Limitations PT providing verbal& tactile cues to reduce shoulder hike and improve coordination of scapular motion, mirror feedback for visual cues   ?  ? Shoulder Exercises: ROM/Strengthening  ? UBE (Upper Arm Bike) L1.0 x 6 min (3 min each fwd & back)   ?  ? Vasopneumatic  ? Number Minutes Vasopneumatic  10 minutes   ? Vasopnuematic Location  Shoulder   ? Vasopneumatic Pressure Low   ? Vasopneumatic Temperature  34?   ?  ? Manual Therapy  ? Manual Therapy Joint mobilization;Soft tissue mobilization;Myofascial release;Scapular mobilization;Passive ROM   ? Joint Mobilization grade II-III inf and post glides for shoulder   ? Soft tissue mobilization STM to L UT, LS, anterolateral deltoid, biceps, teres group and  subscapularis; Incisional scar massage   ? Myofascial Release manual TPR to L LS, anterolateral deltoid, biceps, teres group and subscapularis   ? Scapular Mobilization L scapular mobs all directions in sitting - emphasis on retraction and depression   ? Passive ROM L shoulder - all planes to tolerance   ? ?  ?  ? ?  ? ? ? ? ? ? ? ? ? ? ? ? PT Short Term Goals - 02/22/22 0807   ? ?  ? PT SHORT TERM GOAL #1  ? Title Patient will be independent with initial HEP   ? Status Achieved   01/29/22  ? Target Date 02/05/22   ?  ? PT SHORT TERM GOAL #2  ? Title Improve posture and alignment with patient to demonstrate improved upright posture with posterior shoulder girdle engaged   ? Status Partially Met   ? Target Date 02/26/22   ?  ? PT SHORT TERM GOAL #3  ? Title Patient to improve L shoulder AROM to >/= 90? flexion and scaption/abduction to increase functional L UE use   ? Status Achieved   02/17/22 - met in supine  ? Target Date 02/26/22   ? ?  ?  ? ?  ? ? ? ? PT Long Term Goals - 02/22/22 0807   ? ?  ? PT LONG TERM GOAL #1  ? Title Patient will be independent with ongoing/advanced HEP for self-management at home in order to build upon functional gains in therapy   ? Status On-going   ? Target Date 04/09/22   ?  ? PT LONG TERM GOAL #2  ? Title Decrease pain in the L shoulder/proximal UE by >/= 50% allowing patient to use L UE for functional activities   ? Status On-going   ? Target Date 04/09/22   ?  ? PT LONG TERM GOAL #3  ? Title Patient to improve L shoulder AROM to [facility] without pain provocation   ? Status On-going   ? Target Date 04/09/22   ?  ? PT LONG TERM GOAL #4  ? Title Patient will demonstrate improved L shoulder strength to >/= 4/5 for functional UE use   ? Status On-going   ? Target Date 04/09/22   ?  ? PT LONG TERM GOAL #5  ? Title Patient to report ability to perform ADLs, household, and work-related tasks without limitation due to L UE/shoulder  pain, LOM or weakness   ? Status On-going   ? Target Date  04/09/22   ? ?  ?  ? ?  ? ? ? ? ? ? ? ? Plan - 02/22/22 1610   ? ? Clinical Impression Statement Rebeckah reports improving functional use of her L arm but remains frustrated by limited overhead ROM. She continues to demonstrate significant substitution and compensatory motions with excessive shoulder hike during attempts at overhead ROM but able to better activate/coordinate scapular movement with verbal and tactile cueing from PT along with mirror feedback for visual cues, allowing for increased AROM both in sitting and supine (refer to flowsheet for ROM measurements). Increased muscle tension still present in L UT, LS anterolateral deltoid, biceps, teres group and subscapularis which is contributing to her ongoing pain and limited ROM - addressed with STM and manual TPR allowing for further gains in ROM. Terrianna is slowly progressing toward her PT goals with STGs now met. She will continue to benefit from skilled PT to restore functional ROM and strength in L shoulder/UE.   ? Comorbidities DM, HTN   ? Rehab Potential Good   ? PT Frequency 2x / week   ? PT Duration 12 weeks   ? PT Treatment/Interventions ADLs/Self Care Home Management;Cryotherapy;Electrical Stimulation;Iontophoresis 19m/ml Dexamethasone;Moist Heat;Therapeutic exercise;Therapeutic activities;Neuromuscular re-education;Patient/family education;Manual techniques;Passive range of motion;Vasopneumatic Device;Joint Manipulations;Dry needling;Taping   ? PT Next Visit Plan rehab per UE ORIF rehab protocol - post-op week #8 as of 02/22/22 (Sx 12/28/21)   ? PT Home Exercise Plan Access Code: [email or web address]  ? Consulted and Agree with Plan of Care Patient   ? ?  ?  ? ?  ? ? ?Patient will benefit from skilled therapeutic intervention in order to improve the following deficits and impairments:  Decreased activity tolerance, Decreased knowledge of precautions, Decreased mobility, Decreased range of motion, Decreased strength, Increased edema, Impaired perceived functional  ability, Impaired UE functional use, Improper body mechanics, Postural dysfunction, Pain, Decreased skin integrity, Decreased scar mobility ? ?Visit Diagnosis: ?Pain of left upper arm ? ?Acute pain of left should

## 2022-02-24 ENCOUNTER — Ambulatory Visit (INDEPENDENT_AMBULATORY_CARE_PROVIDER_SITE_OTHER): Payer: Medicare Other | Admitting: Orthopaedic Surgery

## 2022-02-24 ENCOUNTER — Other Ambulatory Visit (HOSPITAL_BASED_OUTPATIENT_CLINIC_OR_DEPARTMENT_OTHER): Payer: Self-pay | Admitting: Orthopaedic Surgery

## 2022-02-24 ENCOUNTER — Ambulatory Visit (HOSPITAL_BASED_OUTPATIENT_CLINIC_OR_DEPARTMENT_OTHER)
Admission: RE | Admit: 2022-02-24 | Discharge: 2022-02-24 | Disposition: A | Discharge status: Home / Self Care | Payer: Medicare Other | Source: Ambulatory Visit | Attending: Orthopaedic Surgery | Admitting: Orthopaedic Surgery

## 2022-02-24 DIAGNOSIS — S42352A Displaced comminuted fracture of shaft of humerus, left arm, initial encounter for closed fracture: Secondary | ICD-10-CM

## 2022-02-24 NOTE — Progress Notes (Signed)
? ?                            ? ? ?Post Operative Evaluation ?  ? ?Procedure/Date of Surgery: Left humerus open reduction internal fixation 12/29/21 ? ?Interval History:  ? ?Patient presents today 8 weeks status post humerus open reduction internal fixation.  She does occasionally feel what she describes as rubbing although this is dramatically better.  She has been working on increasing her range of motion.  She is doing physical therapy.  She is still somewhat frustrated about her progress. ? ?PMH/PSH/Family History/Social History/Meds/Allergies:   ? ?Past Medical History:  ?Diagnosis Date  ? Arthritis   ? Complication of anesthesia   ? as a child vomitted after tonsils removed  ? Diabetes mellitus without complication ([facility])   ? Hypertension   ? Neuropathy   ? ?Past Surgical History:  ?Procedure Laterality Date  ? APPENDECTOMY    ? CATARACT EXTRACTION W/ INTRAOCULAR LENS IMPLANT    ? CESAREAN SECTION    ? INNER EAR SURGERY    ? ORIF HUMERUS FRACTURE Left 12/28/2021  ? Procedure: LEFT OPEN REDUCTION INTERNAL FIXATION (ORIF) HUMERAL SHAFT FRACTURE;  Surgeon: Huel Cote, MD;  Location[facility];  Service: Orthopedics;  Laterality: Left;  ? TONSILLECTOMY    ? ?Social History  ? ?Socioeconomic History  ? Marital status: Widowed  ?  Spouse name: Not on file  ? Number of children: Not on file  ? Years of education: Not on file  ? Highest education level: Not on file  ?Occupational History  ? Not on file  ?Tobacco Use  ? Smoking status: Never  ? Smokeless tobacco: Never  ?Substance and Sexual Activity  ? Alcohol use: Never  ? Drug use: Never  ? Sexual activity: Not on file  ?Other Topics Concern  ? Not on file  ?Social History Narrative  ? Not on file  ? ?Social Determinants of Health  ? ?Financial Resource Strain: Not on file  ?Food Insecurity: Not on file  ?Transportation Needs: Not on file  ?Physical Activity: Not on file  ?Stress: Not on file  ?Social Connections: Not on file  ? ?No family history on  file. ?Allergies  ?Allergen Reactions  ? Cortisone Other (See Comments)  ?  Had some numbness in her face. States it happened after a cortisone injection in her knee, had never had reaction to it prior to this incident.   ? ?Current Outpatient Medications  ?Medication Sig Dispense Refill  ? amLODipine (NORVASC) 5 MG tablet Take 5 mg by mouth at bedtime.    ? aspirin EC 325 MG tablet Take 1 tablet (325 mg total) by mouth daily. 30 tablet 0  ? hydrOXYzine (ATARAX) 25 MG tablet Take 25 mg by mouth at bedtime.    ? ibuprofen (ADVIL) 200 MG tablet Take 400-600 mg by mouth every 8 (eight) hours as needed for moderate pain.    ? lisinopril-hydrochlorothiazide (ZESTORETIC) 20-25 MG tablet Take 1 tablet by mouth daily.    ? metFORMIN (GLUCOPHAGE) 1000 MG tablet Take 1,000 mg by mouth daily with breakfast.    ? MOUNJARO 2.5 MG/0.5ML Pen Inject 2.5 mg into the skin every Tuesday.    ? oxyCODONE (OXY IR/ROXICODONE) 5 MG immediate release tablet Take 1 tablet (5 mg total) by mouth every 4 (four) hours as needed (severe pain). 20 tablet 0  ? pregabalin (LYRICA) 50 MG capsule Take 50 mg by mouth at  bedtime. Pt may take a 2nd dose throughout the day if needed    ? simvastatin (ZOCOR) 5 MG tablet Take 5 mg by mouth at bedtime.    ? ?No current facility-administered medications for this visit.  ? ?No results found. ? ?Review of Systems:   ?A ROS was performed including pertinent positives and negatives as documented in the HPI. ? ? ?Musculoskeletal Exam:   ? ?There were no vitals taken for this visit. ? ?Left incision is clean dry intact without erythema or well-appearing.  She able flex and extend at the left elbow.  She able to fire EPL as well as the wrist extensors.  2+ radial pulse.  Fires interosseous muscles.  Active forward elevation is to 90 degrees.  Active external rotation at the side is to 20 degrees. ? ?Imaging:   ? ?Left humerus 2 view x-ray: ?Status post open reduction internal fixation without evidence of hardware  failure increasing callus formation about the fracture site ? ?I personally reviewed and interpreted the radiographs. ? ? ?Assessment:   ?66 year old female status post left humerus open reduction internal fixation at this time I would like her to be activity and range of motion as tolerated.  At this time she is doing quite well with physical therapy in my opinion.  At this time I would like her to continue to progress on strengthening of the shoulder and work towards increasing active range of motion.  She will come back to see me in 8 weeks ? ?Plan :   ? ?-Return to clinic in 8 weeks ? ? ? ? ?I personally saw and evaluated the patient, and participated in the management and treatment plan. ? ?Huel Cote, MD ?Attending Physician, Orthopedic Surgery ? ?This document was dictated using Conservation officer, historic buildings. A reasonable attempt at proof reading has been made to minimize errors. ?

## 2022-02-25 ENCOUNTER — Ambulatory Visit: Payer: Medicare Other

## 2022-02-25 DIAGNOSIS — M25512 Pain in left shoulder: Secondary | ICD-10-CM

## 2022-02-25 DIAGNOSIS — M25612 Stiffness of left shoulder, not elsewhere classified: Secondary | ICD-10-CM

## 2022-02-25 DIAGNOSIS — R293 Abnormal posture: Secondary | ICD-10-CM

## 2022-02-25 DIAGNOSIS — M6281 Muscle weakness (generalized): Secondary | ICD-10-CM

## 2022-02-25 DIAGNOSIS — M79622 Pain in left upper arm: Secondary | ICD-10-CM | POA: Diagnosis not present

## 2022-02-25 NOTE — Therapy (Signed)
[facility] [facility] [place] [facility] ?[address]  [address] ?[place], [place], [address] ?Phone: [phone]   Fax:  [phone] ? ?Physical Therapy Treatment ? ?Patient Details  ?Name: Caitlin Odonnell ?MRN: [id number] ?Date of Birth: 09/13/1956 ?Referring Provider (PT): Steven Bokshan, MD ? ? ?Encounter Date: 02/25/2022 ? ? PT End of Session - 02/25/22 0846   ? ? Visit Number 10   ? Number of Visits 24   ? Date for PT Re-Evaluation 04/09/22   ? Authorization Type [employer] Medicare   ? Progress Note Due on Visit 19   MD PN on visit #9 - 02/22/22  ? PT Start Time 0801   ? PT Stop Time 0855   ? PT Time Calculation (min) 54 min   ? Activity Tolerance Patient tolerated treatment well   ? Behavior During Therapy [facility] for tasks assessed/performed   ? ?  ?  ? ?  ? ? ?Past Medical History:  ?Diagnosis Date  ? Arthritis   ? Complication of anesthesia   ? as a child vomitted after tonsils removed  ? Diabetes mellitus without complication (H[facility])   ? Hypertension   ? Neuropathy   ? ? ?Past Surgical History:  ?Procedure Laterality Date  ? APPENDECTOMY    ? CATARACT EXTRACTION W/ INTRAOCULAR LENS IMPLANT    ? CESAREAN SECTION    ? INNER EAR SURGERY    ? ORIF HUMERUS FRACTURE Left 12/28/2021  ? Procedure: LEFT OPEN REDUCTION INTERNAL FIXATION (ORIF) HUMERAL SHAFT FRACTURE;  Surgeon: Bokshan, Steven, MD;  Location: [facility];  Service: Orthopedics;  Laterality: Left;  ? TONSILLECTOMY    ? ? ?There were no vitals filed for this visit. ? ? Subjective Assessment - 02/25/22 0806   ? ? Subjective 'It's tight on the inside of my arm, doctor said that her bone is healing very slowly, just keep working with PT.'   ? Pertinent History Closed displaced comminuted fracture of shaft of left humerus 07/25/21 s/p ORIF on 12/28/21   ? Diagnostic tests 09/22/21 - L humerus x-ray: Healing midshaft humeral fracture in unchanged alignment. Interval peripheral callus formation.   ? Patient Stated Goals "to get my arm working like normal"   ?  Currently in Pain? Yes   ? Pain Score 7    ? Pain Location Arm   ? Pain Orientation Left;Upper   ? Pain Descriptors / Indicators Aching;Tightness   ? Pain Type Surgical pain;Chronic pain   ? ?  ?  ? ?  ? ? ? ? ? [facility] PT Assessment - 02/25/22 0001   ? ?  ? Assessment  ? Next MD Visit 04/21/22   ? ?  ?  ? ?  ? ? ? ? ? ? ? ? ? ? ? ? ? ? ? ? [facility] Adult PT Treatment/Exercise - 02/25/22 0001   ? ?  ? Shoulder Exercises: Standing  ? Flexion Left;Weights;20 reps   ? Shoulder Flexion Weight (lbs) 1   ? Flexion Limitations table slide with 1# cuff weight   ? ABduction Left;Weights;20 reps   ? Shoulder ABduction Weight (lbs) 1   ? ABduction Limitations table slide with 1# cuff weight   ? Other Standing Exercises AA flexion (prayer) with hands on wall 20x   cues to avoid pain  ?  ? Shoulder Exercises: ROM/Strengthening  ? UBE (Upper Arm Bike) L1.0 x 6 min (3 min each fwd & back)   ?  ? Vasopneumatic  ? Number Minutes Vasopneumatic    10 minutes   ? Vasopnuematic Location  Shoulder   ? Vasopneumatic Pressure Low   ?  ? Manual Therapy  ? Manual Therapy Joint mobilization;Passive ROM;Soft tissue mobilization   ? Joint Mobilization grade II-III inf and post glides for shoulder   ? Soft tissue mobilization IASTM/STM to R biceps with mobilization   ? Passive ROM L shoulder - all planes to tolerance   ? ?  ?  ? ?  ? ? ? ? ? ? ? ? ? ? ? ? PT Short Term Goals - 02/22/22 0807   ? ?  ? PT SHORT TERM GOAL #1  ? Title Patient will be independent with initial HEP   ? Status Achieved   01/29/22  ? Target Date 02/05/22   ?  ? PT SHORT TERM GOAL #2  ? Title Improve posture and alignment with patient to demonstrate improved upright posture with posterior shoulder girdle engaged   ? Status Partially Met   ? Target Date 02/26/22   ?  ? PT SHORT TERM GOAL #3  ? Title Patient to improve L shoulder AROM to >/= 90? flexion and scaption/abduction to increase functional L UE use   ? Status Achieved   02/17/22 - met in supine  ? Target Date 02/26/22   ? ?  ?   ? ?  ? ? ? ? PT Long Term Goals - 02/22/22 0807   ? ?  ? PT LONG TERM GOAL #1  ? Title Patient will be independent with ongoing/advanced HEP for self-management at home in order to build upon functional gains in therapy   ? Status On-going   ? Target Date 04/09/22   ?  ? PT LONG TERM GOAL #2  ? Title Decrease pain in the L shoulder/proximal UE by >/= 50% allowing patient to use L UE for functional activities   ? Status On-going   ? Target Date 04/09/22   ?  ? PT LONG TERM GOAL #3  ? Title Patient to improve L shoulder AROM to WFL without pain provocation   ? Status On-going   ? Target Date 04/09/22   ?  ? PT LONG TERM GOAL #4  ? Title Patient will demonstrate improved L shoulder strength to >/= 4/5 for functional UE use   ? Status On-going   ? Target Date 04/09/22   ?  ? PT LONG TERM GOAL #5  ? Title Patient to report ability to perform ADLs, household, and work-related tasks without limitation due to L UE/shoulder pain, LOM or weakness   ? Status On-going   ? Target Date 04/09/22   ? ?  ?  ? ?  ? ? ? ? ? ? ? ? Plan - 02/25/22 0847   ? ? Clinical Impression Statement Pt continues to demonstrate limitations with flexion ROM in upright positions. She reported everything went well with her MD visit, he just wants her to keep working in PT and that the ROM will slowly recover. Continued working on MT to improve functional shoulder ROM. Pt required cues to avoid pain with the AA flexion in standing, explained to pt that constantly pushing to pain halts progress to stay within pain-free ROM to maximize benefits with exercises. Finished session with GR to reduce swelling and soreness.   ? Personal Factors and Comorbidities Comorbidity 2;Past/Current Experience;Time since onset of injury/illness/exacerbation;Fitness   ? Comorbidities DM, HTN   ? PT Frequency 2x / week   ? PT Duration 12 weeks   ?   PT Treatment/Interventions ADLs/Self Care Home Management;Cryotherapy;Electrical Stimulation;Iontophoresis 62m/ml  Dexamethasone;Moist Heat;Therapeutic exercise;Therapeutic activities;Neuromuscular re-education;Patient/family education;Manual techniques;Passive range of motion;Vasopneumatic Device;Joint Manipulations;Dry needling;Taping   ? PT Next Visit Plan rehab per UE ORIF rehab protocol - post-op week #8 as of 02/22/22 (Sx 12/28/21)   ? PT Home Exercise Plan Access Code: [email or web address]  ? Consulted and Agree with Plan of Care Patient   ? ?  ?  ? ?  ? ? ?Patient will benefit from skilled therapeutic intervention in order to improve the following deficits and impairments:  Decreased activity tolerance, Decreased knowledge of precautions, Decreased mobility, Decreased range of motion, Decreased strength, Increased edema, Impaired perceived functional ability, Impaired UE functional use, Improper body mechanics, Postural dysfunction, Pain, Decreased skin integrity, Decreased scar mobility ? ?Visit Diagnosis: ?Pain of left upper arm ? ?Acute pain of left shoulder ? ?Stiffness of left shoulder, not elsewhere classified ? ?Muscle weakness (generalized) ? ?Abnormal posture ? ? ? ? ?Problem List ?Patient Active Problem List  ? Diagnosis Date Noted  ? Humeral shaft fracture 12/28/2021  ? Closed displaced comminuted fracture of shaft of left humerus   ? ? ?BArtist Pais PTA ?02/25/2022, 9:14 AM ? ?[place] [facility] [place] ?[address]?[place] [address]?Phone: [phone]  Fax:  [phone]? ?Name: MJazleen Robeck?MRN: [id number]?Date of Birth: 1August 04, 1957? ? ? ?

## 2022-03-01 ENCOUNTER — Ambulatory Visit: Payer: Medicare Other | Attending: Orthopaedic Surgery

## 2022-03-01 DIAGNOSIS — M6281 Muscle weakness (generalized): Secondary | ICD-10-CM | POA: Diagnosis present

## 2022-03-01 DIAGNOSIS — M25612 Stiffness of left shoulder, not elsewhere classified: Secondary | ICD-10-CM | POA: Insufficient documentation

## 2022-03-01 DIAGNOSIS — R293 Abnormal posture: Secondary | ICD-10-CM | POA: Diagnosis present

## 2022-03-01 DIAGNOSIS — M25512 Pain in left shoulder: Secondary | ICD-10-CM | POA: Diagnosis present

## 2022-03-01 DIAGNOSIS — M79622 Pain in left upper arm: Secondary | ICD-10-CM | POA: Diagnosis present

## 2022-03-01 NOTE — Therapy (Signed)
[place] ?[facility] ?[address] ?[place], [place], [address] ?Phone: [phone]   Fax:  [phone] ? ?Physical Therapy Treatment ? ?Patient Details  ?Name: Caitlin Odonnell ?MRN: [id number] ?Date of Birth: January 22, 1956 ?Referring Provider (PT): Vanetta Mulders, MD ? ? ?Encounter Date: 03/01/2022 ? ? PT End of Session - 03/01/22 0851   ? ? Visit Number 11   ? Number of Visits 24   ? Date for PT Re-Evaluation 04/09/22   ? Authorization Type UHC Medicare   ? Progress Note Due on Visit 63   MD PN on visit #9 - 02/22/22  ? PT Start Time 0805   ? PT Stop Time 0856   ? PT Time Calculation (min) 51 min   ? Activity Tolerance Patient tolerated treatment well   ? Behavior During Therapy [facility] for tasks assessed/performed   ? ?  ?  ? ?  ? ? ?Past Medical History:  ?Diagnosis Date  ? Arthritis   ? Complication of anesthesia   ? as a child vomitted after tonsils removed  ? Diabetes mellitus without complication ([place])   ? Hypertension   ? Neuropathy   ? ? ?Past Surgical History:  ?Procedure Laterality Date  ? APPENDECTOMY    ? CATARACT EXTRACTION W/ INTRAOCULAR LENS IMPLANT    ? CESAREAN SECTION    ? INNER EAR SURGERY    ? ORIF HUMERUS FRACTURE Left 12/28/2021  ? Procedure: LEFT OPEN REDUCTION INTERNAL FIXATION (ORIF) HUMERAL SHAFT FRACTURE;  Surgeon: Vanetta Mulders, MD;  Location: [place];  Service: Orthopedics;  Laterality: Left;  ? TONSILLECTOMY    ? ? ?There were no vitals filed for this visit. ? ? Subjective Assessment - 03/01/22 0809   ? ? Subjective 'It aches all the time, except when I am not doing anything.'   ? Pertinent History Closed displaced comminuted fracture of shaft of left humerus 07/25/21 s/p ORIF on 12/28/21   ? Diagnostic tests 09/22/21 - L humerus x-ray: Healing midshaft humeral fracture in unchanged alignment. Interval peripheral callus formation.   ? Patient Stated Goals "to get my arm working like normal"   ? Currently in Pain? Yes   ? Pain Score 5    ? Pain  Location Arm   ? Pain Orientation Left;Upper   ? Pain Descriptors / Indicators Aching;Tightness   ? ?  ?  ? ?  ? ? ? ? ? ? ? ? ? ? ? ? ? ? ? ? ? ? ? ? [place] Adult PT Treatment/Exercise - 03/01/22 0001   ? ?  ? Shoulder Exercises: Standing  ? External Rotation Strengthening;Left;10 reps;Theraband   ? Theraband Level (Shoulder External Rotation) Level 2 (Red)   ? External Rotation Limitations cuing to keep elbow to side and shoulder neutral   ? Flexion Left;Weights;20 reps   ? Shoulder Flexion Weight (lbs) 2   ? Flexion Limitations table slide with 2# cuff weight   ? ABduction Left;Weights;20 reps   ? Shoulder ABduction Weight (lbs) 2   ? ABduction Limitations table slide with 2# cuff weight   ? Extension Strengthening;Both;10 reps;Theraband   ? Theraband Level (Shoulder Extension) Level 3 (Green)   ? Row Strengthening;Both;10 reps;Theraband   2 sets  ? Theraband Level (Shoulder Row) Level 3 (Green)   ?  ? Shoulder Exercises: ROM/Strengthening  ? UBE (Upper Arm Bike) L1.5 x 6 min (3 min each fwd & back)   ? Wall Wash B UE into flexion :  10 with pillowcase; 10 with orange pball   ? Wall Pushups 10 reps   ? Wall Pushups Limitations small ROM   ?  ? Vasopneumatic  ? Number Minutes Vasopneumatic  10 minutes   ? Vasopnuematic Location  Shoulder   ? Vasopneumatic Pressure Low   ? Vasopneumatic Temperature  34?   ?  ? Manual Therapy  ? Manual Therapy Joint mobilization;Soft tissue mobilization;Passive ROM   ? Joint Mobilization grade II-III inf and post glides for shoulder   ? Soft tissue mobilization STM to R biceps with mobilization   ? Passive ROM L shoulder - all planes to tolerance   ? ?  ?  ? ?  ? ? ? ? ? ? ? ? ? ? ? ? PT Short Term Goals - 03/01/22 0857   ? ?  ? PT SHORT TERM GOAL #1  ? Title Patient will be independent with initial HEP   ? Status Achieved   01/29/22  ? Target Date 02/05/22   ?  ? PT SHORT TERM GOAL #2  ? Title Improve posture and alignment with patient to demonstrate improved upright posture with  posterior shoulder girdle engaged   ? Status Achieved   03/01/22  ? Target Date 02/26/22   ?  ? PT SHORT TERM GOAL #3  ? Title Patient to improve L shoulder AROM to >/= 90? flexion and scaption/abduction to increase functional L UE use   ? Status Achieved   02/17/22 - met in supine  ? Target Date 02/26/22   ? ?  ?  ? ?  ? ? ? ? PT Long Term Goals - 02/22/22 0807   ? ?  ? PT LONG TERM GOAL #1  ? Title Patient will be independent with ongoing/advanced HEP for self-management at home in order to build upon functional gains in therapy   ? Status On-going   ? Target Date 04/09/22   ?  ? PT LONG TERM GOAL #2  ? Title Decrease pain in the L shoulder/proximal UE by >/= 50% allowing patient to use L UE for functional activities   ? Status On-going   ? Target Date 04/09/22   ?  ? PT LONG TERM GOAL #3  ? Title Patient to improve L shoulder AROM to [facility] without pain provocation   ? Status On-going   ? Target Date 04/09/22   ?  ? PT LONG TERM GOAL #4  ? Title Patient will demonstrate improved L shoulder strength to >/= 4/5 for functional UE use   ? Status On-going   ? Target Date 04/09/22   ?  ? PT LONG TERM GOAL #5  ? Title Patient to report ability to perform ADLs, household, and work-related tasks without limitation due to L UE/shoulder pain, LOM or weakness   ? Status On-going   ? Target Date 04/09/22   ? ?  ?  ? ?  ? ? ? ? ? ? ? ? Plan - 03/01/22 0851   ? ? Clinical Impression Statement Pt is 9 weeks post op as of today. She demonstrates more awareness of posture - STG #2 now met. She currently remains limited with her OH reaching ability, as ther ex is focused on improving her OH reaching ability. We also worked on postural strenghening and RTC strength to improve her postural alignment allowing for proper mechanics of the [place] joint to improve functional ROM. Pt was fatigued after the interventions. Cues were needed throughout session to avoid pain and  proper technique with exercises. Ended session with GR to reduce post  exercise swelling and soreness.   ? Personal Factors and Comorbidities Comorbidity 2;Past/Current Experience;Time since onset of injury/illness/exacerbation;Fitness   ? Comorbidities DM, HTN   ? PT Frequency 2x / week   ? PT Duration 12 weeks   ? PT Treatment/Interventions ADLs/Self Care Home Management;Cryotherapy;Electrical Stimulation;Iontophoresis 76m/ml Dexamethasone;Moist Heat;Therapeutic exercise;Therapeutic activities;Neuromuscular re-education;Patient/family education;Manual techniques;Passive range of motion;Vasopneumatic Device;Joint Manipulations;Dry needling;Taping   ? PT Next Visit Plan rehab per UE ORIF rehab protocol - post-op week #9 as of 03/01/22 (Sx 12/28/21)   ? PT Home Exercise Plan Access Code: [email or web address]  ? Consulted and Agree with Plan of Care Patient   ? ?  ?  ? ?  ? ? ?Patient will benefit from skilled therapeutic intervention in order to improve the following deficits and impairments:  Decreased activity tolerance, Decreased knowledge of precautions, Decreased mobility, Decreased range of motion, Decreased strength, Increased edema, Impaired perceived functional ability, Impaired UE functional use, Improper body mechanics, Postural dysfunction, Pain, Decreased skin integrity, Decreased scar mobility ? ?Visit Diagnosis: ?Pain of left upper arm ? ?Acute pain of left shoulder ? ?Stiffness of left shoulder, not elsewhere classified ? ?Muscle weakness (generalized) ? ?Abnormal posture ? ? ? ? ?Problem List ?Patient Active Problem List  ? Diagnosis Date Noted  ? Humeral shaft fracture 12/28/2021  ? Closed displaced comminuted fracture of shaft of left humerus   ? ? ?BArtist Pais PTA ?03/01/2022, 8:58 AM ? ?[place] ?[facility] ?[address]?[place] [address]?Phone: [phone]  Fax:  [phone]? ?Name: MMiraya Cudney?MRN: [id number]?Date of Birth: 108/12/1955? ? ? ?

## 2022-03-04 ENCOUNTER — Ambulatory Visit: Payer: Medicare Other

## 2022-03-04 DIAGNOSIS — M6281 Muscle weakness (generalized): Secondary | ICD-10-CM

## 2022-03-04 DIAGNOSIS — R293 Abnormal posture: Secondary | ICD-10-CM

## 2022-03-04 DIAGNOSIS — M25612 Stiffness of left shoulder, not elsewhere classified: Secondary | ICD-10-CM

## 2022-03-04 DIAGNOSIS — M25512 Pain in left shoulder: Secondary | ICD-10-CM

## 2022-03-04 DIAGNOSIS — M79622 Pain in left upper arm: Secondary | ICD-10-CM

## 2022-03-04 NOTE — Therapy (Signed)
[place] ?[facility] ?[address] ?[place], [place], [address] ?Phone: [phone]   Fax:  [phone] ? ?Physical Therapy Treatment ? ?Patient Details  ?Name: Caitlin Odonnell ?MRN: [id number] ?Date of Birth: 04/01/1956 ?Referring Provider (PT): Vanetta Mulders, MD ? ? ?Encounter Date: 03/04/2022 ? ? PT End of Session - 03/04/22 0845   ? ? Visit Number 12   ? Number of Visits 24   ? Date for PT Re-Evaluation 04/09/22   ? Authorization Type UHC Medicare   ? Progress Note Due on Visit 50   MD PN on visit #9 - 02/22/22  ? PT Start Time [phone]   ? PT Stop Time 0858   ? PT Time Calculation (min) 55 min   ? Activity Tolerance Patient tolerated treatment well   ? Behavior During Therapy [facility] for tasks assessed/performed   ? ?  ?  ? ?  ? ? ?Past Medical History:  ?Diagnosis Date  ? Arthritis   ? Complication of anesthesia   ? as a child vomitted after tonsils removed  ? Diabetes mellitus without complication ([place])   ? Hypertension   ? Neuropathy   ? ? ?Past Surgical History:  ?Procedure Laterality Date  ? APPENDECTOMY    ? CATARACT EXTRACTION W/ INTRAOCULAR LENS IMPLANT    ? CESAREAN SECTION    ? INNER EAR SURGERY    ? ORIF HUMERUS FRACTURE Left 12/28/2021  ? Procedure: LEFT OPEN REDUCTION INTERNAL FIXATION (ORIF) HUMERAL SHAFT FRACTURE;  Surgeon: Vanetta Mulders, MD;  Location: [place];  Service: Orthopedics;  Laterality: Left;  ? TONSILLECTOMY    ? ? ?There were no vitals filed for this visit. ? ? Subjective Assessment - 03/04/22 0805   ? ? Subjective Still constantly painful in the shoulder   ? Pertinent History Closed displaced comminuted fracture of shaft of left humerus 07/25/21 s/p ORIF on 12/28/21   ? Diagnostic tests 09/22/21 - L humerus x-ray: Healing midshaft humeral fracture in unchanged alignment. Interval peripheral callus formation.   ? Patient Stated Goals "to get my arm working like normal"   ? Currently in Pain? Yes   ? Pain Score 6    ? Pain Location Shoulder   ?  Pain Orientation Left;Upper;Medial   ? Pain Descriptors / Indicators Aching;Tightness   ? Pain Type Surgical pain   ? ?  ?  ? ?  ? ? ? ? ? [facility] PT Assessment - 03/04/22 0001   ? ?  ? ROM / Strength  ? AROM / PROM / Strength Strength   ?  ? Strength  ? Strength Assessment Site Shoulder   ? Right/Left Shoulder Left   ? Left Shoulder Flexion 3/5   ? Left Shoulder ABduction 3/5   ? Left Shoulder Internal Rotation 4+/5   ? Left Shoulder External Rotation 4-/5   ? ?  ?  ? ?  ? ? ? ? ? ? ? ? ? ? ? ? ? ? ? ? [facility] Adult PT Treatment/Exercise - 03/04/22 0001   ? ?  ? Shoulder Exercises: Supine  ? Flexion AAROM;Both;20 reps   ? Flexion Limitations with 1# weight bar   ? Other Supine Exercises chest press with 1# weight bar 2 x 10   ?  ? Shoulder Exercises: ROM/Strengthening  ? UBE (Upper Arm Bike) L2.0 x 6 min (3 min each fwd & back)   ?  ? Vasopneumatic  ? Number Minutes Vasopneumatic  10 minutes   ?  Vasopnuematic Location  Shoulder   ? Vasopneumatic Pressure Low   ? Vasopneumatic Temperature  34?   ?  ? Manual Therapy  ? Manual Therapy Joint mobilization;Soft tissue mobilization;Passive ROM   ? Joint Mobilization grade II-III inf and post glides for shoulder   ? Soft tissue mobilization IASTM to R biceps, triceps, ant deltoid, lateral deltoid with stainless steel tools   ? Passive ROM L shoulder - all planes to tolerance   ? ?  ?  ? ?  ? ? ? ? ? ? ? ? ? ? ? ? PT Short Term Goals - 03/01/22 0857   ? ?  ? PT SHORT TERM GOAL #1  ? Title Patient will be independent with initial HEP   ? Status Achieved   01/29/22  ? Target Date 02/05/22   ?  ? PT SHORT TERM GOAL #2  ? Title Improve posture and alignment with patient to demonstrate improved upright posture with posterior shoulder girdle engaged   ? Status Achieved   03/01/22  ? Target Date 02/26/22   ?  ? PT SHORT TERM GOAL #3  ? Title Patient to improve L shoulder AROM to >/= 90? flexion and scaption/abduction to increase functional L UE use   ? Status Achieved   02/17/22 - met in  supine  ? Target Date 02/26/22   ? ?  ?  ? ?  ? ? ? ? PT Long Term Goals - 03/04/22 0839   ? ?  ? PT LONG TERM GOAL #1  ? Title Patient will be independent with ongoing/advanced HEP for self-management at home in order to build upon functional gains in therapy   ? Status On-going   ? Target Date 04/09/22   ?  ? PT LONG TERM GOAL #2  ? Title Decrease pain in the L shoulder/proximal UE by >/= 50% allowing patient to use L UE for functional activities   ? Status On-going   ? Target Date 04/09/22   ?  ? PT LONG TERM GOAL #3  ? Title Patient to improve L shoulder AROM to [facility] without pain provocation   ? Status On-going   ? Target Date 04/09/22   ?  ? PT LONG TERM GOAL #4  ? Title Patient will demonstrate improved L shoulder strength to >/= 4/5 for functional UE use   ? Status On-going   03/04/22- specifics in flowsheet  ? Target Date 04/09/22   ?  ? PT LONG TERM GOAL #5  ? Title Patient to report ability to perform ADLs, household, and work-related tasks without limitation due to L UE/shoulder pain, LOM or weakness   ? Status On-going   ? Target Date 04/09/22   ? ?  ?  ? ?  ? ? ? ? ? ? ? ? Plan - 03/04/22 0852   ? ? Clinical Impression Statement Pt continues to demonstrate difficulty with raising her arm OH against gravity. MT was mostly done to the shoulder to decrease muscle tension and improve ROM. MMT revealed weakness in shoulder flex, abd, and ER. Pt would benefit from more strengthening exercises to help with OH reaching against gravity to restore function.   ? Personal Factors and Comorbidities Comorbidity 2;Past/Current Experience;Time since onset of injury/illness/exacerbation;Fitness   ? Comorbidities DM, HTN   ? PT Frequency 2x / week   ? PT Duration 12 weeks   ? PT Treatment/Interventions ADLs/Self Care Home Management;Cryotherapy;Electrical Stimulation;Iontophoresis 66m/ml Dexamethasone;Moist Heat;Therapeutic exercise;Therapeutic activities;Neuromuscular re-education;Patient/family education;Manual  techniques;Passive range of  motion;Vasopneumatic Device;Joint Manipulations;Dry needling;Taping   ? PT Next Visit Plan rehab per UE ORIF rehab protocol - post-op week #11 as of 03/08/22 (Sx 12/28/21)   ? PT Home Exercise Plan Access Code: [email or web address]   ? Consulted and Agree with Plan of Care Patient   ? ?  ?  ? ?  ? ? ?Patient will benefit from skilled therapeutic intervention in order to improve the following deficits and impairments:  Decreased activity tolerance, Decreased knowledge of precautions, Decreased mobility, Decreased range of motion, Decreased strength, Increased edema, Impaired perceived functional ability, Impaired UE functional use, Improper body mechanics, Postural dysfunction, Pain, Decreased skin integrity, Decreased scar mobility ? ?Visit Diagnosis: ?Pain of left upper arm ? ?Acute pain of left shoulder ? ?Stiffness of left shoulder, not elsewhere classified ? ?Muscle weakness (generalized) ? ?Abnormal posture ? ? ? ? ?Problem List ?Patient Active Problem List  ? Diagnosis Date Noted  ? Humeral shaft fracture 12/28/2021  ? Closed displaced comminuted fracture of shaft of left humerus   ? ? ?Caitlin Odonnell, PTA ?03/04/2022, 9:44 AM ? ?[place] ?[facility] ?[address] ?[place], [place], [address] ?Phone: [phone]   Fax:  [phone] ? ?Name: Anginette Espejo ?MRN: [id number] ?Date of Birth: August 22, 1956 ? ? ? ?

## 2022-03-09 ENCOUNTER — Ambulatory Visit: Payer: Medicare Other

## 2022-03-09 DIAGNOSIS — M79622 Pain in left upper arm: Secondary | ICD-10-CM | POA: Diagnosis not present

## 2022-03-09 DIAGNOSIS — M25512 Pain in left shoulder: Secondary | ICD-10-CM

## 2022-03-09 DIAGNOSIS — M6281 Muscle weakness (generalized): Secondary | ICD-10-CM

## 2022-03-09 DIAGNOSIS — R293 Abnormal posture: Secondary | ICD-10-CM

## 2022-03-09 DIAGNOSIS — M25612 Stiffness of left shoulder, not elsewhere classified: Secondary | ICD-10-CM

## 2022-03-09 NOTE — Therapy (Signed)
[place] ?[facility] ?[address] ?[place], [place], [address] ?Phone: [phone]   Fax:  [phone] ? ?Physical Therapy Treatment ? ?Patient Details  ?Name: Caitlin Odonnell ?MRN: [id number] ?Date of Birth: 05/17/1956 ?Referring Provider (PT): Vanetta Mulders, MD ? ? ?Encounter Date: 03/09/2022 ? ? PT End of Session - 03/09/22 0845   ? ? Visit Number 13   ? Number of Visits 24   ? Date for PT Re-Evaluation 04/09/22   ? Authorization Type UHC Medicare   ? Progress Note Due on Visit 52   MD PN on visit #9 - 02/22/22  ? PT Start Time [phone]   ? PT Stop Time 0845   ? PT Time Calculation (min) 42 min   ? Activity Tolerance Patient tolerated treatment well;Patient limited by pain   ? Behavior During Therapy [facility] for tasks assessed/performed   ? ?  ?  ? ?  ? ? ?Past Medical History:  ?Diagnosis Date  ? Arthritis   ? Complication of anesthesia   ? as a child vomitted after tonsils removed  ? Diabetes mellitus without complication ([place])   ? Hypertension   ? Neuropathy   ? ? ?Past Surgical History:  ?Procedure Laterality Date  ? APPENDECTOMY    ? CATARACT EXTRACTION W/ INTRAOCULAR LENS IMPLANT    ? CESAREAN SECTION    ? INNER EAR SURGERY    ? ORIF HUMERUS FRACTURE Left 12/28/2021  ? Procedure: LEFT OPEN REDUCTION INTERNAL FIXATION (ORIF) HUMERAL SHAFT FRACTURE;  Surgeon: Vanetta Mulders, MD;  Location: [place];  Service: Orthopedics;  Laterality: Left;  ? TONSILLECTOMY    ? ? ?There were no vitals filed for this visit. ? ? Subjective Assessment - 03/09/22 0805   ? ? Subjective Pt reports that her shoulder still hurts.   ? Pertinent History Closed displaced comminuted fracture of shaft of left humerus 07/25/21 s/p ORIF on 12/28/21   ? Diagnostic tests 09/22/21 - L humerus x-ray: Healing midshaft humeral fracture in unchanged alignment. Interval peripheral callus formation.   ? Patient Stated Goals "to get my arm working like normal"   ? Currently in Pain? Yes   ? Pain Score 6    ? Pain  Location Shoulder   ? Pain Orientation Left;Medial;Anterior   ? Pain Descriptors / Indicators Other (Comment)   "pain"  ? Pain Type Chronic pain;Surgical pain   ? ?  ?  ? ?  ? ? ? ? ? ? ? ? ? ? ? ? ? ? ? ? ? ? ? ? [place] Adult PT Treatment/Exercise - 03/09/22 0001   ? ?  ? Shoulder Exercises: Supine  ? Flexion AAROM;Both;12 reps   ? Flexion Limitations with 2# weight around cane   ? Other Supine Exercises chest press with 2# weight around cane x 20   ?  ? Shoulder Exercises: Sidelying  ? External Rotation AROM;Left;10 reps   ? Flexion AROM;Left;10 reps   ?  ? Shoulder Exercises: Standing  ? External Rotation Strengthening;Left;10 reps;Theraband   ? Theraband Level (Shoulder External Rotation) Level 3 (Green)   ? External Rotation Weight (lbs) isometric step out   ? Extension Strengthening;Both;Theraband;20 reps   ? Theraband Level (Shoulder Extension) Level 3 (Green)   ?  ? Shoulder Exercises: Pulleys  ? Flexion Limitations 10x10"   ?  ? Shoulder Exercises: ROM/Strengthening  ? UBE (Upper Arm Bike) L2.5 x 6 min (3 min each fwd & back)   ?  ?  Manual Therapy  ? Manual Therapy Soft tissue mobilization   ? Soft tissue mobilization IASTM to R biceps, ant deltoid, inscisional massage with stainless steel tools   ? ?  ?  ? ?  ? ? ? ? ? ? ? ? ? ? ? ? PT Short Term Goals - 03/01/22 0857   ? ?  ? PT SHORT TERM GOAL #1  ? Title Patient will be independent with initial HEP   ? Status Achieved   01/29/22  ? Target Date 02/05/22   ?  ? PT SHORT TERM GOAL #2  ? Title Improve posture and alignment with patient to demonstrate improved upright posture with posterior shoulder girdle engaged   ? Status Achieved   03/01/22  ? Target Date 02/26/22   ?  ? PT SHORT TERM GOAL #3  ? Title Patient to improve L shoulder AROM to >/= 90? flexion and scaption/abduction to increase functional L UE use   ? Status Achieved   02/17/22 - met in supine  ? Target Date 02/26/22   ? ?  ?  ? ?  ? ? ? ? PT Long Term Goals - 03/04/22 0839   ? ?  ? PT LONG TERM  GOAL #1  ? Title Patient will be independent with ongoing/advanced HEP for self-management at home in order to build upon functional gains in therapy   ? Status On-going   ? Target Date 04/09/22   ?  ? PT LONG TERM GOAL #2  ? Title Decrease pain in the L shoulder/proximal UE by >/= 50% allowing patient to use L UE for functional activities   ? Status On-going   ? Target Date 04/09/22   ?  ? PT LONG TERM GOAL #3  ? Title Patient to improve L shoulder AROM to [facility] without pain provocation   ? Status On-going   ? Target Date 04/09/22   ?  ? PT LONG TERM GOAL #4  ? Title Patient will demonstrate improved L shoulder strength to >/= 4/5 for functional UE use   ? Status On-going   03/04/22- specifics in flowsheet  ? Target Date 04/09/22   ?  ? PT LONG TERM GOAL #5  ? Title Patient to report ability to perform ADLs, household, and work-related tasks without limitation due to L UE/shoulder pain, LOM or weakness   ? Status On-going   ? Target Date 04/09/22   ? ?  ?  ? ?  ? ? ? ? ? ? ? ? Plan - 03/09/22 0846   ? ? Clinical Impression Statement Pt continues to be limited by L shoulder pain along the anterior deltoid and bicep area. Cues are still required during the session to avoid painful ROM. Any movements where she has to lift OH causes pain. Pt reassured today that keeping exercises within pain-free ROM would be more beneficial to allow for recovery.   ? Personal Factors and Comorbidities Comorbidity 2;Past/Current Experience;Time since onset of injury/illness/exacerbation;Fitness   ? Comorbidities DM, HTN   ? PT Frequency 2x / week   ? PT Duration 12 weeks   ? PT Treatment/Interventions ADLs/Self Care Home Management;Cryotherapy;Electrical Stimulation;Iontophoresis 55m/ml Dexamethasone;Moist Heat;Therapeutic exercise;Therapeutic activities;Neuromuscular re-education;Patient/family education;Manual techniques;Passive range of motion;Vasopneumatic Device;Joint Manipulations;Dry needling;Taping   ? PT Next Visit Plan rehab per  UE ORIF rehab protocol - post-op week #11 as of 03/08/22 (Sx 12/28/21)   ? PT Home Exercise Plan Access Code: [email or web address]  ? Consulted and Agree with Plan of Care Patient   ? ?  ?  ? ?  ? ? ?  Patient will benefit from skilled therapeutic intervention in order to improve the following deficits and impairments:  Decreased activity tolerance, Decreased knowledge of precautions, Decreased mobility, Decreased range of motion, Decreased strength, Increased edema, Impaired perceived functional ability, Impaired UE functional use, Improper body mechanics, Postural dysfunction, Pain, Decreased skin integrity, Decreased scar mobility ? ?Visit Diagnosis: ?Pain of left upper arm ? ?Acute pain of left shoulder ? ?Stiffness of left shoulder, not elsewhere classified ? ?Muscle weakness (generalized) ? ?Abnormal posture ? ? ? ? ?Problem List ?Patient Active Problem List  ? Diagnosis Date Noted  ? Humeral shaft fracture 12/28/2021  ? Closed displaced comminuted fracture of shaft of left humerus   ? ? ?Artist Pais, PTA ?03/09/2022, 9:57 AM ? ?[place] ?[facility] ?[address] ?[place], [place], [address] ?Phone: [phone]   Fax:  [phone] ? ?Name: Caitlin Odonnell ?MRN: [id number] ?Date of Birth: 17-Jul-1956 ? ? ? ?

## 2022-03-11 ENCOUNTER — Ambulatory Visit: Payer: Medicare Other

## 2022-03-11 DIAGNOSIS — M6281 Muscle weakness (generalized): Secondary | ICD-10-CM

## 2022-03-11 DIAGNOSIS — M79622 Pain in left upper arm: Secondary | ICD-10-CM | POA: Diagnosis not present

## 2022-03-11 DIAGNOSIS — M25612 Stiffness of left shoulder, not elsewhere classified: Secondary | ICD-10-CM

## 2022-03-11 DIAGNOSIS — R293 Abnormal posture: Secondary | ICD-10-CM

## 2022-03-11 DIAGNOSIS — M25512 Pain in left shoulder: Secondary | ICD-10-CM

## 2022-03-11 NOTE — Therapy (Signed)
[facility] ?[facility] ?[address] ?[place], [place], [address] ?Phone: [phone]   Fax:  [phone] ? ?Physical Therapy Treatment ? ?Patient Details  ?Name: Caitlin Odonnell ?MRN: [id number] ?Date of Birth: 10-Sep-1956 ?Referring Provider (PT): Vanetta Mulders, MD ? ? ?Encounter Date: 03/11/2022 ? ? PT End of Session - 03/11/22 0851   ? ? Visit Number 14   ? Number of Visits 24   ? Date for PT Re-Evaluation 04/09/22   ? Authorization Type UHC Medicare   ? Progress Note Due on Visit 41   MD PN on visit #9 - 02/22/22  ? PT Start Time [phone]   ? PT Stop Time 0850   ? PT Time Calculation (min) 46 min   ? Activity Tolerance Patient tolerated treatment well   ? Behavior During Therapy [facility] for tasks assessed/performed   ? ?  ?  ? ?  ? ? ?Past Medical History:  ?Diagnosis Date  ? Arthritis   ? Complication of anesthesia   ? as a child vomitted after tonsils removed  ? Diabetes mellitus without complication ([place])   ? Hypertension   ? Neuropathy   ? ? ?Past Surgical History:  ?Procedure Laterality Date  ? APPENDECTOMY    ? CATARACT EXTRACTION W/ INTRAOCULAR LENS IMPLANT    ? CESAREAN SECTION    ? INNER EAR SURGERY    ? ORIF HUMERUS FRACTURE Left 12/28/2021  ? Procedure: LEFT OPEN REDUCTION INTERNAL FIXATION (ORIF) HUMERAL SHAFT FRACTURE;  Surgeon: Vanetta Mulders, MD;  Location: [facility];  Service: Orthopedics;  Laterality: Left;  ? TONSILLECTOMY    ? ? ?There were no vitals filed for this visit. ? ? Subjective Assessment - 03/11/22 0807   ? ? Subjective Pt still feels like her shoulder is pulling out when reaching forward.   ? Pertinent History Closed displaced comminuted fracture of shaft of left humerus 07/25/21 s/p ORIF on 12/28/21   ? Diagnostic tests 09/22/21 - L humerus x-ray: Healing midshaft humeral fracture in unchanged alignment. Interval peripheral callus formation.   ? Patient Stated Goals "to get my arm working like normal"   ? Currently in Pain? Yes   ? Pain Score 5    ?  Pain Location Shoulder   ? Pain Orientation Left;Medial;Anterior   ? Pain Descriptors / Indicators --   "pain"  ? Pain Type Chronic pain;Surgical pain   ? ?  ?  ? ?  ? ? ? ? ? [facility] PT Assessment - 03/11/22 0001   ? ?  ? AROM  ? Left Shoulder Flexion 127 Degrees   ? Left Shoulder ABduction 120 Degrees   ? Left Shoulder External Rotation --   FER to L5  ? ?  ?  ? ?  ? ? ? ? ? ? ? ? ? ? ? ? ? ? ? ? [facility] Adult PT Treatment/Exercise - 03/11/22 0001   ? ?  ? Shoulder Exercises: Standing  ? External Rotation Strengthening;Left;10 reps;Theraband   ? Theraband Level (Shoulder External Rotation) Level 3 (Green)   ? External Rotation Weight (lbs) isometric step out   ? Extension Strengthening;Left;10 reps;Theraband   ? Theraband Level (Shoulder Extension) Level 3 (Green)   ? Row Strengthening;Both;Theraband;20 reps   ? Theraband Level (Shoulder Row) Level 3 (Green)   ? Diagonals Strengthening;Both;10 reps;Theraband   ? Theraband Level (Shoulder Diagonals) Level 3 (Green)   ? Diagonals Limitations D1 extension   ? Other Standing Exercises AA IR  and ER with pillowcase behind back  x 10   ?  ? Shoulder Exercises: ROM/Strengthening  ? UBE (Upper Arm Bike) L3.0 x 6 min (3 min each fwd & back)   ?  ? Manual Therapy  ? Manual Therapy Soft tissue mobilization   ? Soft tissue mobilization IASTM to R biceps, deltoids, triceps, inscisional massage with stainless steel tools   ? ?  ?  ? ?  ? ? ? ? ? ? ? ? ? ? ? ? PT Short Term Goals - 03/01/22 0857   ? ?  ? PT SHORT TERM GOAL #1  ? Title Patient will be independent with initial HEP   ? Status Achieved   01/29/22  ? Target Date 02/05/22   ?  ? PT SHORT TERM GOAL #2  ? Title Improve posture and alignment with patient to demonstrate improved upright posture with posterior shoulder girdle engaged   ? Status Achieved   03/01/22  ? Target Date 02/26/22   ?  ? PT SHORT TERM GOAL #3  ? Title Patient to improve L shoulder AROM to >/= 90? flexion and scaption/abduction to increase functional L UE use    ? Status Achieved   02/17/22 - met in supine  ? Target Date 02/26/22   ? ?  ?  ? ?  ? ? ? ? PT Long Term Goals - 03/11/22 0849   ? ?  ? PT LONG TERM GOAL #1  ? Title Patient will be independent with ongoing/advanced HEP for self-management at home in order to build upon functional gains in therapy   ? Status On-going   ? Target Date 04/09/22   ?  ? PT LONG TERM GOAL #2  ? Title Decrease pain in the L shoulder/proximal UE by >/= 50% allowing patient to use L UE for functional activities   ? Status On-going   ? Target Date 04/09/22   ?  ? PT LONG TERM GOAL #3  ? Title Patient to improve L shoulder AROM to [facility] without pain provocation   ? Status On-going   03/11/22- improvements in ROM, still pain with each direction  ? Target Date 04/09/22   ?  ? PT LONG TERM GOAL #4  ? Title Patient will demonstrate improved L shoulder strength to >/= 4/5 for functional UE use   ? Status On-going   03/04/22- specifics in flowsheet  ? Target Date 04/09/22   ?  ? PT LONG TERM GOAL #5  ? Title Patient to report ability to perform ADLs, household, and work-related tasks without limitation due to L UE/shoulder pain, LOM or weakness   ? Status On-going   ? Target Date 04/09/22   ? ?  ?  ? ?  ? ? ? ? ? ? ? ? Plan - 03/11/22 0851   ? ? Clinical Impression Statement Pt is showing small improvements in ROM, however still notes pain with all movements at the shoulder. The musculature around the shoulder remains tight, she may benefit from DN to improve shoulder ROM. She tolerates posterior shoulder and RTC strengthening well, although shows fatigue in shoulder after ~2 exercises. She would continue to benefit from PT in order to restore her function in the L shoulder.   ? Personal Factors and Comorbidities Comorbidity 2;Past/Current Experience;Time since onset of injury/illness/exacerbation;Fitness   ? Comorbidities DM, HTN   ? PT Frequency 2x / week   ? PT Duration 12 weeks   ? PT Treatment/Interventions ADLs/Self Care Home  Management;Cryotherapy;Electrical Stimulation;Iontophoresis 54m/ml Dexamethasone;Moist Heat;Therapeutic exercise;Therapeutic activities;Neuromuscular re-education;Patient/family education;Manual techniques;Passive range of motion;Vasopneumatic Device;Joint Manipulations;Dry needling;Taping   ? PT Next Visit Plan rehab per UE ORIF rehab protocol - post-op week #11 as of 03/08/22 (Sx 12/28/21)   ? PT Home Exercise Plan Access Code: [email or web address]  ? Consulted and Agree with Plan of Care Patient   ? ?  ?  ? ?  ? ? ?Patient will benefit from skilled therapeutic intervention in order to improve the following deficits and impairments:  Decreased activity tolerance, Decreased knowledge of precautions, Decreased mobility, Decreased range of motion, Decreased strength, Increased edema, Impaired perceived functional ability, Impaired UE functional use, Improper body mechanics, Postural dysfunction, Pain, Decreased skin integrity, Decreased scar mobility ? ?Visit Diagnosis: ?Pain of left upper arm ? ?Acute pain of left shoulder ? ?Stiffness of left shoulder, not elsewhere classified ? ?Muscle weakness (generalized) ? ?Abnormal posture ? ? ? ? ?Problem List ?Patient Active Problem List  ? Diagnosis Date Noted  ? Humeral shaft fracture 12/28/2021  ? Closed displaced comminuted fracture of shaft of left humerus   ? ? ?BArtist Pais PTA ?03/11/2022, 10:02 AM ? ?[facility] [facility] ?[facility] ?[address]?[place] [address]?Phone: [phone]  Fax:  [phone]? ?Name: MYaremi Stahlman?MRN: [id number]?Date of Birth: 1Dec 18, 1957? ? ? ?

## 2022-03-15 ENCOUNTER — Ambulatory Visit: Payer: Medicare Other | Admitting: Physical Therapy

## 2022-03-15 ENCOUNTER — Encounter: Payer: Self-pay | Admitting: Physical Therapy

## 2022-03-15 DIAGNOSIS — M79622 Pain in left upper arm: Secondary | ICD-10-CM | POA: Diagnosis not present

## 2022-03-15 DIAGNOSIS — M6281 Muscle weakness (generalized): Secondary | ICD-10-CM

## 2022-03-15 DIAGNOSIS — M25512 Pain in left shoulder: Secondary | ICD-10-CM

## 2022-03-15 DIAGNOSIS — R293 Abnormal posture: Secondary | ICD-10-CM

## 2022-03-15 DIAGNOSIS — M25612 Stiffness of left shoulder, not elsewhere classified: Secondary | ICD-10-CM

## 2022-03-15 NOTE — Therapy (Signed)
[facility] ?[facility] ?[address] ?[place], [place], [address] ?Phone: [phone]   Fax:  [phone] ? ?Physical Therapy Treatment ? ?Patient Details  ?Name: Rayetta Veith ?MRN: [id number] ?Date of Birth: [birth year] ?Referring Provider (PT): Vanetta Mulders, MD ? ? ?Encounter Date: 03/15/2022 ? ? PT End of Session - 03/15/22 0804   ? ? Visit Number 15   ? Number of Visits 24   ? Date for PT Re-Evaluation 04/09/22   ? Authorization Type UHC Medicare   ? Progress Note Due on Visit 26   MD PN on visit #9 - 02/22/22  ? PT Start Time [phone]   ? PT Stop Time 0856   ? PT Time Calculation (min) 52 min   ? Activity Tolerance Patient tolerated treatment well   ? Behavior During Therapy [facility] for tasks assessed/performed   ? ?  ?  ? ?  ? ? ?Past Medical History:  ?Diagnosis Date  ? Arthritis   ? Complication of anesthesia   ? as a child vomitted after tonsils removed  ? Diabetes mellitus without complication ([place])   ? Hypertension   ? Neuropathy   ? ? ?Past Surgical History:  ?Procedure Laterality Date  ? APPENDECTOMY    ? CATARACT EXTRACTION W/ INTRAOCULAR LENS IMPLANT    ? CESAREAN SECTION    ? INNER EAR SURGERY    ? ORIF HUMERUS FRACTURE Left 12/28/2021  ? Procedure: LEFT OPEN REDUCTION INTERNAL FIXATION (ORIF) HUMERAL SHAFT FRACTURE;  Surgeon: Vanetta Mulders, MD;  Location: [facility];  Service: Orthopedics;  Laterality: Left;  ? TONSILLECTOMY    ? ? ?There were no vitals filed for this visit. ? ? Subjective Assessment - 03/15/22 0808   ? ? Subjective Pt reports she still struggles with simple things such as pulling her hair up in a ponytail, holding her phone to her ear or lifting things.   ? Pertinent History Closed displaced comminuted fracture of shaft of left humerus 07/25/21 s/p ORIF on 12/28/21   ? Diagnostic tests 09/22/21 - L humerus x-ray: Healing midshaft humeral fracture in unchanged alignment. Interval peripheral callus formation.   ? Patient Stated Goals "to get my arm  working like normal"   ? Currently in Pain? Yes   ? Pain Score 2    ? Pain Location Shoulder   ? Pain Orientation Left   ? Pain Descriptors / Indicators --   'pulling"  ? Pain Type Chronic pain;Surgical pain   ? Pain Frequency Constant   ? ?  ?  ? ?  ? ? ? ? ? ? ? ? ? ? ? ? ? ? ? ? ? ? ? ? [facility] Adult PT Treatment/Exercise - 03/15/22 0804   ? ?  ? Shoulder Exercises: ROM/Strengthening  ? UBE (Upper Arm Bike) L3.0 x 6 min (3 min each fwd & back)   ?  ? Vasopneumatic  ? Number Minutes Vasopneumatic  10 minutes   ? Vasopnuematic Location  Shoulder   ? Vasopneumatic Pressure Low   ? Vasopneumatic Temperature  34?   ?  ? Manual Therapy  ? Manual Therapy Joint mobilization;Soft tissue mobilization;Myofascial release;Passive ROM;Muscle Energy Technique   ? Manual therapy comments skilled palpation and monitoring of soft tissue during DN   ? Joint Mobilization grade II-III inf and post glides for L shoulder   ? Soft tissue mobilization IASTM to R biceps, deltoids, pecs and infraspinatus; incisional scar massage   ? Myofascial Release manual  TPR to L LS, anterolateral deltoid, biceps, and infraspinatus   ? Passive ROM L shoulder - all planes to tolerance   ? Muscle Energy Technique gentle contract/relax into L shoulder ER   ? ?  ?  ? ?  ? ? ? [place] Dry Needling - 03/15/22 0804   ? ? Consent Given? Yes   ? Education Handout Provided Previously provided   ? Muscles Treated Upper Quadrant Pectoralis major;Deltoid;Biceps   ? Dry Needling Comments Left   ? Pectoralis Major Response Twitch response elicited;Palpable increased muscle length   ? Deltoid Response Twitch response elicited;Palpable increased muscle length   ? Biceps Response Twitch response elicited;Palpable increased muscle length   ? ?  ?  ? ?  ? ? ? ? ? ? ? ? ? ? PT Short Term Goals - 03/01/22 0857   ? ?  ? PT SHORT TERM GOAL #1  ? Title Patient will be independent with initial HEP   ? Status Achieved   01/29/22  ? Target Date 02/05/22   ?  ? PT SHORT TERM  GOAL #2  ? Title Improve posture and alignment with patient to demonstrate improved upright posture with posterior shoulder girdle engaged   ? Status Achieved   03/01/22  ? Target Date 02/26/22   ?  ? PT SHORT TERM GOAL #3  ? Title Patient to improve L shoulder AROM to >/= 90? flexion and scaption/abduction to increase functional L UE use   ? Status Achieved   02/17/22 - met in supine  ? Target Date 02/26/22   ? ?  ?  ? ?  ? ? ? ? PT Long Term Goals - 03/11/22 0849   ? ?  ? PT LONG TERM GOAL #1  ? Title Patient will be independent with ongoing/advanced HEP for self-management at home in order to build upon functional gains in therapy   ? Status On-going   ? Target Date 04/09/22   ?  ? PT LONG TERM GOAL #2  ? Title Decrease pain in the L shoulder/proximal UE by >/= 50% allowing patient to use L UE for functional activities   ? Status On-going   ? Target Date 04/09/22   ?  ? PT LONG TERM GOAL #3  ? Title Patient to improve L shoulder AROM to [facility] without pain provocation   ? Status On-going   03/11/22- improvements in ROM, still pain with each direction  ? Target Date 04/09/22   ?  ? PT LONG TERM GOAL #4  ? Title Patient will demonstrate improved L shoulder strength to >/= 4/5 for functional UE use   ? Status On-going   03/04/22- specifics in flowsheet  ? Target Date 04/09/22   ?  ? PT LONG TERM GOAL #5  ? Title Patient to report ability to perform ADLs, household, and work-related tasks without limitation due to L UE/shoulder pain, LOM or weakness   ? Status On-going   ? Target Date 04/09/22   ? ?  ?  ? ?  ? ? ? ? ? ? ? ? Plan - 03/15/22 0811   ? ? Clinical Impression Statement Sharlize continues to report constant pain and limited functional use of her L arm. She localizes worst pain to anterior shoulder, but taut tender bands and TPs identified t/o L shoulder complex. These have been resistant to prior manual STM/IASTM, therefore incorporated DN with manual STM/TPR followed by joint mobs and PROM/MWM to help promote further  muscle relaxation  and improved shoulder ROM. Pt may benefit from ionto patch to address anterior shoulder pain over coracoid process, therefore will send order to MD requesting approval for use of ionto patch.   ? Comorbidities DM, HTN   ? Rehab Potential Good   ? PT Frequency 2x / week   ? PT Duration 12 weeks   ? PT Treatment/Interventions ADLs/Self Care Home Management;Cryotherapy;Electrical Stimulation;Iontophoresis 36m/ml Dexamethasone;Moist Heat;Therapeutic exercise;Therapeutic activities;Neuromuscular re-education;Patient/family education;Manual techniques;Passive range of motion;Vasopneumatic Device;Joint Manipulations;Dry needling;Taping   ? PT Next Visit Plan ionto patch to L anterior shoulder if order signed by MD; rehab per UE ORIF rehab protocol - post-op week #12 as of 03/15/22 (Sx 12/28/21)   ? PT Home Exercise Plan Access Code: [email or web address]  ? Consulted and Agree with Plan of Care Patient   ? ?  ?  ? ?  ? ? ?Patient will benefit from skilled therapeutic intervention in order to improve the following deficits and impairments:  Decreased activity tolerance, Decreased knowledge of precautions, Decreased mobility, Decreased range of motion, Decreased strength, Increased edema, Impaired perceived functional ability, Impaired UE functional use, Improper body mechanics, Postural dysfunction, Pain, Decreased skin integrity, Decreased scar mobility ? ?Visit Diagnosis: ?Pain of left upper arm ? ?Acute pain of left shoulder ? ?Stiffness of left shoulder, not elsewhere classified ? ?Muscle weakness (generalized) ? ?Abnormal posture ? ? ? ? ?Problem List ?Patient Active Problem List  ? Diagnosis Date Noted  ? Humeral shaft fracture 12/28/2021  ? Closed displaced comminuted fracture of shaft of left humerus   ? ? ?JPercival Spanish PT ?03/15/2022, 10:23 AM ? ?[facility] ?[facility] ?[address]?[place] [address]?Phone: [phone]  Fax:   [phone]? ?Name: MTranice Laduke?MRN: [id number]?Date of Birth: [birth year]? ? ? ?

## 2022-03-18 ENCOUNTER — Ambulatory Visit: Payer: Medicare Other

## 2022-03-18 DIAGNOSIS — M79622 Pain in left upper arm: Secondary | ICD-10-CM

## 2022-03-18 DIAGNOSIS — M6281 Muscle weakness (generalized): Secondary | ICD-10-CM

## 2022-03-18 DIAGNOSIS — R293 Abnormal posture: Secondary | ICD-10-CM

## 2022-03-18 DIAGNOSIS — M25512 Pain in left shoulder: Secondary | ICD-10-CM

## 2022-03-18 DIAGNOSIS — M25612 Stiffness of left shoulder, not elsewhere classified: Secondary | ICD-10-CM

## 2022-03-18 NOTE — Patient Instructions (Signed)
Access Code: [email or web address] URL: [email or web address] Date: 03/18/2022 Prepared by: Clarene Essex  Exercises - Seated Shoulder Abduction AAROM with Pulley Behind  - 1 x daily - 7 x weekly - 2 sets - 10 reps - 3 sec hold - Seated Shoulder Flexion Towel Slide at Table Top Full Range of Motion  - 1 x daily - 7 x weekly - 2 sets - 10 reps - 3 sec hold - Seated Shoulder Abduction Towel Slide at Table Top  - 1 x daily - 7 x weekly - 2 sets - 10 reps - 3 sec hold - Standing Bilateral Low Shoulder Row with Anchored Resistance  - 1 x daily - 3 x weekly - 2 sets - 10 reps - 5 sec hold - Seated Gentle Upper Trapezius Stretch  - 2-3 x daily - 7 x weekly - 3 reps - 30 sec hold - Seated Cervical Sidebending Stretch  - 2-3 x daily - 7 x weekly - 3 reps - 30 sec hold - Seated Levator Scapulae Stretch  - 1 x daily - 7 x weekly - 3 reps - 30 sec hold - Seated Shoulder Flexion Extension AAROM with Dowel into Wall  - 1 x daily - 7 x weekly - 2 sets - 10 reps - 3 sec hold - Seated Shoulder Circles AAROM with Dowel into Wall  - 1 x daily - 7 x weekly - 2 sets - 10 reps - 3 sec hold - Shoulder Extension with Resistance  - 1 x daily - 3 x weekly - 2 sets - 10 reps - Shoulder external rotation  - 1 x daily - 3 x weekly - 3 sets - 10 reps - 3 sec hold - Shoulder Internal Rotation Reactive Isometrics  - 1 x daily - 3 x weekly - 3 sets - 10 reps - 3 sec hold - Seated Overhead Shoulder External Rotation Stretch with Towel  - 2 x daily - 7 x weekly - 2 sets - 10 reps - 3 sec hold hold  Patient Education - TENS Unit - TENS Therapy - [place] Dry Needling

## 2022-03-18 NOTE — Therapy (Signed)
[facility] [place] [address]  [place], [place], [address] Phone: [phone]   Fax:  [phone]  Physical Therapy Treatment  Patient Details  Name: Caitlin Odonnell MRN: [id number] Date of Birth: 1956-05-26 Referring Provider (PT): Vanetta Mulders, MD   Encounter Date: 03/18/2022   PT End of Session - 03/18/22 0849     Visit Number 16    Number of Visits 24    Date for PT Re-Evaluation 04/09/22    Authorization Type [employer] Medicare    Progress Note Due on Visit 32   MD PN on visit #9 - 02/22/22   PT Start Time 0801    PT Stop Time 0844    PT Time Calculation (min) 43 min    Activity Tolerance Patient tolerated treatment well    Behavior During Therapy [facility] for tasks assessed/performed             Past Medical History:  Diagnosis Date   Arthritis    Complication of anesthesia    as a child vomitted after tonsils removed   Diabetes mellitus without complication ([place])    Hypertension    Neuropathy     Past Surgical History:  Procedure Laterality Date   APPENDECTOMY     CATARACT EXTRACTION W/ INTRAOCULAR LENS IMPLANT     CESAREAN SECTION     INNER EAR SURGERY     ORIF HUMERUS FRACTURE Left 12/28/2021   Procedure: LEFT OPEN REDUCTION INTERNAL FIXATION (ORIF) HUMERAL SHAFT FRACTURE;  Surgeon: Vanetta Mulders, MD;  Location: [place];  Service: Orthopedics;  Laterality: Left;   TONSILLECTOMY      There were no vitals filed for this visit.   Subjective Assessment - 03/18/22 0803     Subjective Pt reports her shoulder is doing about the same.    Pertinent History Closed displaced comminuted fracture of shaft of left humerus 07/25/21 s/p ORIF on 12/28/21    Diagnostic tests 09/22/21 - L humerus x-ray: Healing midshaft humeral fracture in unchanged alignment. Interval peripheral callus formation.    Patient Stated Goals "to get my arm working like normal"    Currently in Pain? Yes    Pain Score 2     Pain Location  Shoulder    Pain Orientation Left    Pain Descriptors / Indicators --   "pulling"   Pain Type Chronic pain;Surgical pain                               [facility] Adult PT Treatment/Exercise - 03/18/22 0001       Shoulder Exercises: Supine   Flexion Strengthening;Left;10 reps;Weights    Shoulder Flexion Weight (lbs) 2      Shoulder Exercises: Sidelying   External Rotation AROM;20 reps;Left    ABduction AROM;Left;10 reps      Shoulder Exercises: ROM/Strengthening   UBE (Upper Arm Bike) L3.0 x 6 min (3 min each fwd & back)      Manual Therapy   Soft tissue mobilization IASTM to R biceps, deltoids, pecs, triceps; incisional scar massage    Myofascial Release manual TPR to L LS, anterolateral deltoid, biceps, and infraspinatus    Passive ROM L shoulder - all planes to tolerance    Muscle Energy Technique gentle contract/relax into L shoulder flexion                     PT Education - 03/18/22 3818  Education Details OH ER stretch added to HEP    Person(s) Educated Patient    Methods Explanation;Demonstration;Handout    Comprehension Verbalized understanding;Returned demonstration              PT Short Term Goals - 03/01/22 0857       PT SHORT TERM GOAL #1   Title Patient will be independent with initial HEP    Status Achieved   01/29/22   Target Date 02/05/22      PT SHORT TERM GOAL #2   Title Improve posture and alignment with patient to demonstrate improved upright posture with posterior shoulder girdle engaged    Status Achieved   03/01/22   Target Date 02/26/22      PT SHORT TERM GOAL #3   Title Patient to improve L shoulder AROM to >/= 90 flexion and scaption/abduction to increase functional L UE use    Status Achieved   02/17/22 - met in supine   Target Date 02/26/22               PT Long Term Goals - 03/11/22 0849       PT LONG TERM GOAL #1   Title Patient will be independent with ongoing/advanced HEP for  self-management at home in order to build upon functional gains in therapy    Status On-going    Target Date 04/09/22      PT LONG TERM GOAL #2   Title Decrease pain in the L shoulder/proximal UE by >/= 50% allowing patient to use L UE for functional activities    Status On-going    Target Date 04/09/22      PT LONG TERM GOAL #3   Title Patient to improve L shoulder AROM to [facility] without pain provocation    Status On-going   03/11/22- improvements in ROM, still pain with each direction   Target Date 04/09/22      PT LONG TERM GOAL #4   Title Patient will demonstrate improved L shoulder strength to >/= 4/5 for functional UE use    Status On-going   03/04/22- specifics in flowsheet   Target Date 04/09/22      PT LONG TERM GOAL #5   Title Patient to report ability to perform ADLs, household, and work-related tasks without limitation due to L UE/shoulder pain, LOM or weakness    Status On-going ( still difficulty doing hair due to ROM limitation)   Target Date 04/09/22                   Plan - 03/18/22 0850     Clinical Impression Statement Pt continues to demonstrate limited functional use of L shoulder. MT does seem to help although she does not like DN. Since she still has functional limitations we working on shoulder ROM and strength lying down to ease her into more upright ROM. She reports that her pain for the most part has not changed. We were unable to do ionto patch as MD still has not signed off on order, pt declined modalities.    Personal Factors and Comorbidities Comorbidity 2;Past/Current Experience;Time since onset of injury/illness/exacerbation;Fitness    Comorbidities DM, HTN    PT Frequency 2x / week    PT Duration 12 weeks    PT Treatment/Interventions ADLs/Self Care Home Management;Cryotherapy;Electrical Stimulation;Iontophoresis 4mg /ml Dexamethasone;Moist Heat;Therapeutic exercise;Therapeutic activities;Neuromuscular re-education;Patient/family education;Manual  techniques;Passive range of motion;Vasopneumatic Device;Joint Manipulations;Dry needling;Taping    PT Next Visit Plan ionto patch to L anterior shoulder if order signed  by MD; rehab per UE ORIF rehab protocol - post-op week #12 as of 03/15/22 (Sx 12/28/21)    PT Home Exercise Plan Access Code: [email or web address]    Consulted and Agree with Plan of Care Patient             Patient will benefit from skilled therapeutic intervention in order to improve the following deficits and impairments:  Decreased activity tolerance, Decreased knowledge of precautions, Decreased mobility, Decreased range of motion, Decreased strength, Increased edema, Impaired perceived functional ability, Impaired UE functional use, Improper body mechanics, Postural dysfunction, Pain, Decreased skin integrity, Decreased scar mobility  Visit Diagnosis: Pain of left upper arm  Acute pain of left shoulder  Stiffness of left shoulder, not elsewhere classified  Muscle weakness (generalized)  Abnormal posture     Problem List Patient Active Problem List   Diagnosis Date Noted   Humeral shaft fracture 12/28/2021   Closed displaced comminuted fracture of shaft of left humerus     Artist Pais, PTA 03/18/2022, 11:12 AM  [facility] [address]  [place], [place], [address] Phone: [phone]   Fax:  [phone]  Name: Caitlin Odonnell MRN: [id number] Date of Birth: 04/15/1956

## 2022-03-22 ENCOUNTER — Ambulatory Visit: Payer: Medicare Other

## 2022-03-22 DIAGNOSIS — M25512 Pain in left shoulder: Secondary | ICD-10-CM

## 2022-03-22 DIAGNOSIS — M25612 Stiffness of left shoulder, not elsewhere classified: Secondary | ICD-10-CM

## 2022-03-22 DIAGNOSIS — M79622 Pain in left upper arm: Secondary | ICD-10-CM

## 2022-03-22 DIAGNOSIS — M6281 Muscle weakness (generalized): Secondary | ICD-10-CM

## 2022-03-22 DIAGNOSIS — R293 Abnormal posture: Secondary | ICD-10-CM

## 2022-03-22 NOTE — Therapy (Addendum)
[facility] [place] [address]  [place], [place], [address] Phone: [phone]   Fax:  [phone]  Physical Therapy Treatment  Patient Details  Name: Caitlin Odonnell MRN: [id number] Date of Birth: 1956-09-24 Referring Provider (PT): Vanetta Mulders, MD   Encounter Date: 03/22/2022   PT End of Session - 03/22/22 0844     Visit Number 17    Number of Visits 24    Date for PT Re-Evaluation 04/09/22    Authorization Type [employer] Medicare    Progress Note Due on Visit 2   MD PN on visit #9 - 02/22/22   PT Start Time 0801    PT Stop Time 0853    PT Time Calculation (min) 52 min    Activity Tolerance Patient tolerated treatment well    Behavior During Therapy [facility] for tasks assessed/performed             Past Medical History:  Diagnosis Date   Arthritis    Complication of anesthesia    as a child vomitted after tonsils removed   Diabetes mellitus without complication ([place])    Hypertension    Neuropathy     Past Surgical History:  Procedure Laterality Date   APPENDECTOMY     CATARACT EXTRACTION W/ INTRAOCULAR LENS IMPLANT     CESAREAN SECTION     INNER EAR SURGERY     ORIF HUMERUS FRACTURE Left 12/28/2021   Procedure: LEFT OPEN REDUCTION INTERNAL FIXATION (ORIF) HUMERAL SHAFT FRACTURE;  Surgeon: Vanetta Mulders, MD;  Location: [place];  Service: Orthopedics;  Laterality: Left;   TONSILLECTOMY      There were no vitals filed for this visit.   Subjective Assessment - 03/22/22 0803     Subjective Pt reports her shoulder hurting worse today.    Pertinent History Closed displaced comminuted fracture of shaft of left humerus 07/25/21 s/p ORIF on 12/28/21    Diagnostic tests 09/22/21 - L humerus x-ray: Healing midshaft humeral fracture in unchanged alignment. Interval peripheral callus formation.    Patient Stated Goals "to get my arm working like normal"    Currently in Pain? Yes    Pain Score 6     Pain Location Shoulder     Pain Orientation Left    Pain Descriptors / Indicators --   "feels like the inside of my arm is trying come through the outside of my arm"   Pain Type Chronic pain;Surgical pain             Rationale for Evaluation and Treatment Rehabilitation                   [facility] Adult PT Treatment/Exercise - 03/22/22 0001       Shoulder Exercises: Standing   Flexion Left;Weights;10 reps    Shoulder Flexion Weight (lbs) 2    Flexion Limitations picking L arm and placing on counter as progressing from table slide    ABduction Left;10 reps;Weights    Shoulder ABduction Weight (lbs) 2    ABduction Limitations picking L arm and placing on counter as progressing from table slide      Shoulder Exercises: ROM/Strengthening   UBE (Upper Arm Bike) L3.0 x 6 min (3 min each fwd & back)    Lat Pull Limitations 20# x 12 reps    Cybex Row Limitations 15# x 15 reps; low grip    Wall Pushups 15 reps      Vasopneumatic   Number Minutes Vasopneumatic  10 minutes    Vasopnuematic Location  Shoulder    Vasopneumatic Pressure Low    Vasopneumatic Temperature  34      Manual Therapy   Soft tissue mobilization IASTM to L biceps, deltoids, pecs, triceps, around coracoid process    Myofascial Release manual TPR to L biceps, ant deltoid    Passive ROM L shoulder - all planes to tolerance                       PT Short Term Goals - 03/01/22 0857       PT SHORT TERM GOAL #1   Title Patient will be independent with initial HEP    Status Achieved   01/29/22   Target Date 02/05/22      PT SHORT TERM GOAL #2   Title Improve posture and alignment with patient to demonstrate improved upright posture with posterior shoulder girdle engaged    Status Achieved   03/01/22   Target Date 02/26/22      PT SHORT TERM GOAL #3   Title Patient to improve L shoulder AROM to >/= 90 flexion and scaption/abduction to increase functional L UE use    Status Achieved   02/17/22 - met in supine    Target Date 02/26/22               PT Long Term Goals - 03/11/22 0849       PT LONG TERM GOAL #1   Title Patient will be independent with ongoing/advanced HEP for self-management at home in order to build upon functional gains in therapy    Status On-going    Target Date 04/09/22      PT LONG TERM GOAL #2   Title Decrease pain in the L shoulder/proximal UE by >/= 50% allowing patient to use L UE for functional activities    Status On-going    Target Date 04/09/22      PT LONG TERM GOAL #3   Title Patient to improve L shoulder AROM to [facility] without pain provocation    Status On-going   03/11/22- improvements in ROM, still pain with each direction   Target Date 04/09/22      PT LONG TERM GOAL #4   Title Patient will demonstrate improved L shoulder strength to >/= 4/5 for functional UE use    Status On-going   03/04/22- specifics in flowsheet   Target Date 04/09/22      PT LONG TERM GOAL #5   Title Patient to report ability to perform ADLs, household, and work-related tasks without limitation due to L UE/shoulder pain, LOM or weakness    Status On-going    Target Date 04/09/22                   Plan - 03/22/22 0845     Clinical Impression Statement Pt came in reporting increased L shoulder pain. Manual was done to decrease her pain and to promote muscle relaxation to reduce extra strain on the joint. Worked on movements away from the body as pt reported that opening/closing her car door is challenging for her so she is still struggling with ADLs. She showed a good response to exercises although fatigued after doing reaching movements. Ended session with GR to address pain and soreness post session.    Personal Factors and Comorbidities Comorbidity 2;Past/Current Experience;Time since onset of injury/illness/exacerbation;Fitness    Comorbidities DM, HTN    PT Frequency 2x / week  PT Duration 12 weeks    PT Treatment/Interventions ADLs/Self Care Home  Management;Cryotherapy;Electrical Stimulation;Iontophoresis 23m/ml Dexamethasone;Moist Heat;Therapeutic exercise;Therapeutic activities;Neuromuscular re-education;Patient/family education;Manual techniques;Passive range of motion;Vasopneumatic Device;Joint Manipulations;Dry needling;Taping    PT Next Visit Plan ionto patch to L anterior shoulder if order signed by MD; rehab per UE ORIF rehab protocol - post-op week #13 as of 03/22/22 (Sx 12/28/21)    PT Home Exercise Plan Access Code: [email or web address]   Consulted and Agree with Plan of Care Patient             Patient will benefit from skilled therapeutic intervention in order to improve the following deficits and impairments:  Decreased activity tolerance, Decreased knowledge of precautions, Decreased mobility, Decreased range of motion, Decreased strength, Increased edema, Impaired perceived functional ability, Impaired UE functional use, Improper body mechanics, Postural dysfunction, Pain, Decreased skin integrity, Decreased scar mobility  Visit Diagnosis: Pain of left upper arm  Acute pain of left shoulder  Stiffness of left shoulder, not elsewhere classified  Muscle weakness (generalized)  Abnormal posture     Problem List Patient Active Problem List   Diagnosis Date Noted   Humeral shaft fracture 12/28/2021   Closed displaced comminuted fracture of shaft of left humerus     BArtist Pais PTA 03/22/2022, 9:34 AM  [facility] [place] [address]Phone: [phone]  Fax:  [phone] Name: MJaiyanna SafranMRN: [id number] of Birth: [birth year]

## 2022-03-25 ENCOUNTER — Ambulatory Visit: Payer: Medicare Other | Admitting: Physical Therapy

## 2022-03-25 ENCOUNTER — Encounter: Payer: Self-pay | Admitting: Physical Therapy

## 2022-03-25 DIAGNOSIS — R293 Abnormal posture: Secondary | ICD-10-CM

## 2022-03-25 DIAGNOSIS — M79622 Pain in left upper arm: Secondary | ICD-10-CM

## 2022-03-25 DIAGNOSIS — M6281 Muscle weakness (generalized): Secondary | ICD-10-CM

## 2022-03-25 DIAGNOSIS — M25612 Stiffness of left shoulder, not elsewhere classified: Secondary | ICD-10-CM

## 2022-03-25 DIAGNOSIS — M25512 Pain in left shoulder: Secondary | ICD-10-CM

## 2022-03-25 NOTE — Patient Instructions (Signed)
    IONTOPHORESIS PATIENT PRECAUTIONS & CONTRAINDICATIONS:  Redness under one or both electrodes can occur.  This characterized by a uniform redness that usually disappears within 12 hours of treatment. Small pinhead size blisters may result in response to the drug.  Contact your physician if the problem persists more than 24 hours. On rare occasions, iontophoresis therapy can result in temporary skin reactions such as rash, inflammation, irritation or burns.  The skin reactions may be the result of individual sensitivity to the ionic solution used, the condition of the skin at the start of treatment, reaction to the materials in the electrodes, allergies or sensitivity to dexamethasone, or a poor connection between the patch and your skin.  Discontinue using iontophoresis if you have any of these reactions and report to your therapist. Remove the Patch or electrodes if you have any undue sensation of pain or burning during the treatment and report discomfort to your therapist. Tell your Therapist if you have had known adverse reactions to the application of electrical current. Approximate treatment time is 4-6 hours.  Remove the patch after 6 hours. The Patch can be worn during normal activity, however excessive motion where the electrodes have been placed can cause poor contact between the skin and the electrode or uneven electrical current resulting in greater risk of skin irritation. Keep out of the reach of children.   DO NOT use if you have a cardiac pacemaker or any other electrically sensitive implanted device. DO NOT use if you have a known sensitivity to dexamethasone. DO NOT use during Magnetic Resonance Imaging (MRI). DO NOT use over broken or compromised skin (e.g. sunburn, cuts, or acne) due to the increased risk of skin reaction. DO NOT SHAVE over the area to be treated:  To establish good contact between the Patch and the skin, excessive hair may be clipped. DO NOT place the  Patch or electrodes on or over your eyes, directly over your heart, or brain. DO NOT reuse the Patch or electrodes as this may cause burns to occur.   For questions, please contact your therapist at:  [facility] [place] [address]  [address] [place], [place], [address] Phone: [phone]   Fax:  [phone]  

## 2022-03-25 NOTE — Therapy (Addendum)
[facility] [address]  [place], [place], [address] Phone: [phone]   Fax:  [phone]  Physical Therapy Treatment  Patient Details  Name: Caitlin Odonnell MRN: [id number] Date of Birth: 1956-02-21 Referring Provider (PT): Vanetta Mulders, MD   Encounter Date: 03/25/2022   PT End of Session - 03/25/22 0801     Visit Number 18    Number of Visits 24    Date for PT Re-Evaluation 04/09/22    Authorization Type [employer] Medicare    Progress Note Due on Visit 45   MD PN on visit #9 - 02/22/22   PT Start Time 0801    PT Stop Time 0845    PT Time Calculation (min) 44 min    Activity Tolerance Patient tolerated treatment well    Behavior During Therapy [facility] for tasks assessed/performed             Past Medical History:  Diagnosis Date   Arthritis    Complication of anesthesia    as a child vomitted after tonsils removed   Diabetes mellitus without complication ([place])    Hypertension    Neuropathy     Past Surgical History:  Procedure Laterality Date   APPENDECTOMY     CATARACT EXTRACTION W/ INTRAOCULAR LENS IMPLANT     CESAREAN SECTION     INNER EAR SURGERY     ORIF HUMERUS FRACTURE Left 12/28/2021   Procedure: LEFT OPEN REDUCTION INTERNAL FIXATION (ORIF) HUMERAL SHAFT FRACTURE;  Surgeon: Vanetta Mulders, MD;  Location: [place];  Service: Orthopedics;  Laterality: Left;   TONSILLECTOMY      There were no vitals filed for this visit.   Subjective Assessment - 03/25/22 0805     Subjective Pt reports her shoulder has been bad for the past couple days but not as bad this morning.    Pertinent History Closed displaced comminuted fracture of shaft of left humerus 07/25/21 s/p ORIF on 12/28/21    Diagnostic tests 09/22/21 - L humerus x-ray: Healing midshaft humeral fracture in unchanged alignment. Interval peripheral callus formation.    Patient Stated Goals "to get my arm working like normal"    Currently in Pain? Yes     Pain Score 4     Pain Location Shoulder    Pain Orientation Left    Pain Descriptors / Indicators Constant;Aching    Pain Type Chronic pain;Surgical pain    Pain Frequency Constant                              [facility] Adult PT Treatment/Exercise - 03/25/22 0801       Shoulder Exercises: Standing   Row Both;20 reps;Strengthening;Theraband    Theraband Level (Shoulder Row) Level 3 (Green)    Row Limitations focus on scap retraction & depression with cues for slower pace and increased hold time; avoiding rolling shoulders fwd    Retraction Both;10 reps;Strengthening;Theraband    Theraband Level (Shoulder Retraction) Level 3 (Green)    Retraction Limitations with mini-shoulder extension emphasizing sacp retraction & depression    Other Standing Exercises Green TB lat pull-downs x 10      Shoulder Exercises: ROM/Strengthening   UBE (Upper Arm Bike) L3.0 x 6 min (3 min each fwd & back)      Shoulder Exercises: Stretch   Other Shoulder Stretches low doorway pec stretch      Modalities   Modalities Iontophoresis  Iontophoresis   Type of Iontophoresis Dexamethasone    Location L anterior shoulder    Dose 80 mA-min; 1.0 mL    Time 4-6 hr patch (#1 of 6)      Manual Therapy   Manual Therapy Soft tissue mobilization;Myofascial release;Scapular mobilization;Passive ROM    Joint Mobilization MWM into L shoulder flexion    Soft tissue mobilization STM to L pecs, ant/lat deltoids, teres group    Myofascial Release manual TPR to L pec major/minor and teres group    Scapular Mobilization L scapular mobs all directions in sitting - emphasis on retraction and depression    Passive ROM L shoulder flexion in sitting - pt guarding and hiking/protracting shoulder                     PT Education - 03/25/22 0840     Education Details Ionto patch instructions    Person(s) Educated Patient    Methods Explanation;Handout    Comprehension Verbalized  understanding              PT Short Term Goals - 03/01/22 0857       PT SHORT TERM GOAL #1   Title Patient will be independent with initial HEP    Status Achieved   01/29/22   Target Date 02/05/22      PT SHORT TERM GOAL #2   Title Improve posture and alignment with patient to demonstrate improved upright posture with posterior shoulder girdle engaged    Status Achieved   03/01/22   Target Date 02/26/22      PT SHORT TERM GOAL #3   Title Patient to improve L shoulder AROM to >/= 90 flexion and scaption/abduction to increase functional L UE use    Status Achieved   02/17/22 - met in supine   Target Date 02/26/22               PT Long Term Goals - 03/11/22 0849       PT LONG TERM GOAL #1   Title Patient will be independent with ongoing/advanced HEP for self-management at home in order to build upon functional gains in therapy    Status On-going    Target Date 04/09/22      PT LONG TERM GOAL #2   Title Decrease pain in the L shoulder/proximal UE by >/= 50% allowing patient to use L UE for functional activities    Status On-going    Target Date 04/09/22      PT LONG TERM GOAL #3   Title Patient to improve L shoulder AROM to [facility] without pain provocation    Status On-going   03/11/22- improvements in ROM, still pain with each direction   Target Date 04/09/22      PT LONG TERM GOAL #4   Title Patient will demonstrate improved L shoulder strength to >/= 4/5 for functional UE use    Status On-going   03/04/22- specifics in flowsheet   Target Date 04/09/22      PT LONG TERM GOAL #5   Title Patient to report ability to perform ADLs, household, and work-related tasks without limitation due to L UE/shoulder pain, LOM or weakness    Status On-going    Target Date 04/09/22                   Plan - 03/25/22 0808     Clinical Impression Statement Undine continues to demonstrate excessive shoulder hike and protraction with attempt at  AROM into overhead planes as well  as with PROM/MWM despite PT efforts to engage/facilitate scapular stabilizers and depress humeral head, therefore deferred ROM attempts in favor of MT to reduce abnormal muscle tension and strengthening to reinforce scapular muscle activation. Session conclude with initial trial of ionto patch to L anterior shoulder at area of greatest tenderness near the coracoid process - education provided on ionto patch and instructed pt to take the weekend off from her HEP to see if the patch will make a difference in her pain.    Comorbidities DM, HTN    Rehab Potential Good    PT Frequency 2x / week    PT Duration 12 weeks    PT Treatment/Interventions ADLs/Self Care Home Management;Cryotherapy;Electrical Stimulation;Iontophoresis 52m/ml Dexamethasone;Moist Heat;Therapeutic exercise;Therapeutic activities;Neuromuscular re-education;Patient/family education;Manual techniques;Passive range of motion;Vasopneumatic Device;Joint Manipulations;Dry needling;Taping    PT Next Visit Plan assess response to ionto patch to L anterior shoulder; rehab per UE ORIF rehab protocol - post-op week #14 as of 03/29/22 (Sx 12/28/21); ionto patch to L anterior shoulder if benefit noted    PT Home Exercise Plan Access Code: [email or web address]   Consulted and Agree with Plan of Care Patient             Patient will benefit from skilled therapeutic intervention in order to improve the following deficits and impairments:  Decreased activity tolerance, Decreased knowledge of precautions, Decreased mobility, Decreased range of motion, Decreased strength, Increased edema, Impaired perceived functional ability, Impaired UE functional use, Improper body mechanics, Postural dysfunction, Pain, Decreased skin integrity, Decreased scar mobility  Visit Diagnosis: Pain of left upper arm  Acute pain of left shoulder  Stiffness of left shoulder, not elsewhere classified  Muscle weakness (generalized)  Abnormal posture   Rationale for Evaluation  and Treatment: Rehabilitation    Problem List Patient Active Problem List   Diagnosis Date Noted   Humeral shaft fracture 12/28/2021   Closed displaced comminuted fracture of shaft of left humerus     JPercival Spanish PT 03/25/2022, 12:22 PM  [facility] [address] [place] [address]Phone: [phone]  Fax:  [phone] Name: MRubye StrohmeyerMRN: [id number] of Birth: 104-15-1957

## 2022-03-30 ENCOUNTER — Ambulatory Visit: Payer: Medicare Other

## 2022-04-01 ENCOUNTER — Ambulatory Visit: Payer: Medicare Other

## 2022-04-05 ENCOUNTER — Ambulatory Visit: Payer: Medicare Other | Attending: Orthopaedic Surgery

## 2022-04-05 DIAGNOSIS — M79622 Pain in left upper arm: Secondary | ICD-10-CM | POA: Diagnosis present

## 2022-04-05 DIAGNOSIS — M6281 Muscle weakness (generalized): Secondary | ICD-10-CM | POA: Insufficient documentation

## 2022-04-05 DIAGNOSIS — R293 Abnormal posture: Secondary | ICD-10-CM | POA: Diagnosis present

## 2022-04-05 DIAGNOSIS — M25512 Pain in left shoulder: Secondary | ICD-10-CM | POA: Diagnosis present

## 2022-04-05 DIAGNOSIS — M25612 Stiffness of left shoulder, not elsewhere classified: Secondary | ICD-10-CM | POA: Insufficient documentation

## 2022-04-05 NOTE — Therapy (Addendum)
[facility] [address]  [place], [place], [address] Phone: [phone]   Fax:  [phone]  Physical Therapy Treatment / Progress Note  Patient Details  Name: Caitlin Odonnell MRN: [id number] Date of Birth: February 14, 1956 Referring Provider (PT): Vanetta Mulders, MD  Progress Note  Reporting Period 02/22/2022 to 04/05/2022  See note below for Objective Data and Assessment of Progress/Goals.     Encounter Date: 04/05/2022   PT End of Session - 04/05/22 0850     Visit Number 19    Number of Visits 24    Date for PT Re-Evaluation 04/09/22    Authorization Type [employer] Medicare    Progress Note Due on Visit 39   MD PN on visit #9 - 02/22/22   PT Start Time 0802    PT Stop Time 0845    PT Time Calculation (min) 43 min    Activity Tolerance Patient tolerated treatment well    Behavior During Therapy WFL for tasks assessed/performed             Past Medical History:  Diagnosis Date   Arthritis    Complication of anesthesia    as a child vomitted after tonsils removed   Diabetes mellitus without complication ([place])    Hypertension    Neuropathy     Past Surgical History:  Procedure Laterality Date   APPENDECTOMY     CATARACT EXTRACTION W/ INTRAOCULAR LENS IMPLANT     CESAREAN SECTION     INNER EAR SURGERY     ORIF HUMERUS FRACTURE Left 12/28/2021   Procedure: LEFT OPEN REDUCTION INTERNAL FIXATION (ORIF) HUMERAL SHAFT FRACTURE;  Surgeon: Vanetta Mulders, MD;  Location: [facility];  Service: Orthopedics;  Laterality: Left;   TONSILLECTOMY      There were no vitals filed for this visit.   Subjective Assessment - 04/05/22 0806     Subjective [name] that the ionto patch seemed to help, she had no pain in her arm for 2 days afterwards, and now she feels that she can raise her arm higher.    Pertinent History Closed displaced comminuted fracture of shaft of left humerus 07/25/21 s/p ORIF on 12/28/21    Diagnostic tests  09/22/21 - L humerus x-ray: Healing midshaft humeral fracture in unchanged alignment. Interval peripheral callus formation.    Patient Stated Goals "to get my arm working like normal"    Currently in Pain? No/denies                [facility] PT Assessment - 04/05/22 0001       AROM   Left Shoulder Flexion 127 Degrees    Left Shoulder ABduction 120 Degrees    Left Shoulder External Rotation 51 Degrees                           [facility] Adult PT Treatment/Exercise - 04/05/22 0001       Shoulder Exercises: Standing   External Rotation Strengthening;Left;10 reps;Theraband    Theraband Level (Shoulder External Rotation) Level 3 (Green)    External Rotation Limitations isometric step out; cues to keep shoulder neutral    Internal Rotation Strengthening;Left;10 reps;Theraband    Theraband Level (Shoulder Internal Rotation) Level 3 (Green)    Extension Strengthening;Both;20 reps;Theraband    Theraband Level (Shoulder Extension) Level 3 (Green)      Shoulder Exercises: ROM/Strengthening   UBE (Upper Arm Bike) L3.5 x 6 min (3 min each  fwd & back)    Lat Pull Limitations 20# x 10    Cybex Row Limitations 25# x 10; 20# x 10    Wall Wash tried but pt pushed to pain      Iontophoresis   Type of Iontophoresis Dexamethasone    Location L anterior shoulder    Dose 80 mA-min; 1.0 mL    Time 4-6 hr patch (#1 of 6)      Manual Therapy   Manual Therapy Soft tissue mobilization;Myofascial release    Soft tissue mobilization STM to L biceps, ant deltoid, pecs    Myofascial Release to L biceps, ant deltoid                       PT Short Term Goals - 03/01/22 0857       PT SHORT TERM GOAL #1   Title Patient will be independent with initial HEP    Status Achieved   01/29/22   Target Date 02/05/22      PT SHORT TERM GOAL #2   Title Improve posture and alignment with patient to demonstrate improved upright posture with posterior shoulder girdle engaged    Status  Achieved   03/01/22   Target Date 02/26/22      PT SHORT TERM GOAL #3   Title Patient to improve L shoulder AROM to >/= 90 flexion and scaption/abduction to increase functional L UE use    Status Achieved   02/17/22 - met in supine   Target Date 02/26/22               PT Long Term Goals - 04/05/22 0813       PT LONG TERM GOAL #1   Title Patient will be independent with ongoing/advanced HEP for self-management at home in order to build upon functional gains in therapy    Status On-going    Target Date 04/09/22      PT LONG TERM GOAL #2   Title Decrease pain in the L shoulder/proximal UE by >/= 50% allowing patient to use L UE for functional activities    Status On-going   [name] 40% improvement in pain levels with functional activities   Target Date 04/09/22      PT LONG TERM GOAL #3   Title Patient to improve L shoulder AROM to [facility] without pain provocation    Status On-going   03/11/22- improvements in ROM, still pain with each direction   Target Date 04/09/22      PT LONG TERM GOAL #4   Title Patient will demonstrate improved L shoulder strength to >/= 4/5 for functional UE use    Status On-going   03/04/22- specifics in flowsheet   Target Date 04/09/22      PT LONG TERM GOAL #5   Title Patient to report ability to perform ADLs, household, and work-related tasks without limitation due to L UE/shoulder pain, LOM or weakness    Status On-going   [name] increased ability to reach arm OH into cabinets ever since using ionto patch but still weakness noted   Target Date 04/09/22                   Plan - 04/05/22 0917     Clinical Impression Statement Caitlin Odonnell improvement in pain levels in since using ionto patch last visit. She reported 40% improvement in pain which is far more than before. L shoulder ROM is more functional with [facility]  reaching but pt demonstrates muscle quivering and shaking due to continued weakness. Cues required during session for scapular  retraction and to keep shoulder nuetral with ER. Functional limitations remain to be with grabbing weighted objects out of cabinets. Pt would continue to benefit from skilled PT to address deficits with ROM and strength to improve function.    Personal Factors and Comorbidities Comorbidity 2;Past/Current Experience;Time since onset of injury/illness/exacerbation;Fitness    Comorbidities DM, HTN    PT Frequency 2x / week    PT Duration 12 weeks    PT Treatment/Interventions ADLs/Self Care Home Management;Cryotherapy;Electrical Stimulation;Iontophoresis 60m/ml Dexamethasone;Moist Heat;Therapeutic exercise;Therapeutic activities;Neuromuscular re-education;Patient/family education;Manual techniques;Passive range of motion;Vasopneumatic Device;Joint Manipulations;Dry needling;Taping    PT Next Visit Plan rehab per UE ORIF rehab protocol - post-op week #15 as of 04/05/22 (Sx 12/28/21); ionto patch to L anterior shoulder    PT Home Exercise Plan Access Code: [email or web address]   Consulted and Agree with Plan of Care Patient             Patient will benefit from skilled therapeutic intervention in order to improve the following deficits and impairments:  Decreased activity tolerance, Decreased knowledge of precautions, Decreased mobility, Decreased range of motion, Decreased strength, Increased edema, Impaired perceived functional ability, Impaired UE functional use, Improper body mechanics, Postural dysfunction, Pain, Decreased skin integrity, Decreased scar mobility  Visit Diagnosis: Pain of left upper arm  Acute pain of left shoulder  Stiffness of left shoulder, not elsewhere classified  Muscle weakness (generalized)  Abnormal posture     Problem List Patient Active Problem List   Diagnosis Date Noted   Humeral shaft fracture 12/28/2021   Closed displaced comminuted fracture of shaft of left humerus     BArtist Pais PTA 04/05/2022, 10:40 AM  [facility] [place] [address]Phone: [phone]  Fax:  [phone] Name: Caitlin LascanoMRN: [id number] of Birth: 102/13/1957   MThailynreports improvement in pain since introduction of ionto patches allowing for some improvement in her ability to raise her L arm overhead but still with limited functional use of L UE. Given ongoing deficits, will plan for recert as of next visit.  JPercival Spanish PT, MPT 04/05/22, 8:21 PM  [facility] [place] [address]Phone: [phone]  Fax:  [phone]

## 2022-04-08 ENCOUNTER — Ambulatory Visit: Payer: Medicare Other | Admitting: Physical Therapy

## 2022-04-08 ENCOUNTER — Encounter: Payer: Self-pay | Admitting: Physical Therapy

## 2022-04-08 DIAGNOSIS — M25612 Stiffness of left shoulder, not elsewhere classified: Secondary | ICD-10-CM

## 2022-04-08 DIAGNOSIS — M25512 Pain in left shoulder: Secondary | ICD-10-CM

## 2022-04-08 DIAGNOSIS — M79622 Pain in left upper arm: Secondary | ICD-10-CM

## 2022-04-08 DIAGNOSIS — R293 Abnormal posture: Secondary | ICD-10-CM

## 2022-04-08 DIAGNOSIS — M6281 Muscle weakness (generalized): Secondary | ICD-10-CM

## 2022-04-08 NOTE — Therapy (Signed)
OUTPATIENT PHYSICAL THERAPY RE-CERTIFICATION / TREATMENT NOTE   Patient Name: Caitlin Odonnell MRN: [id number] DOB:1956-06-28, 66 y.o., female Today's Date: 04/08/2022    PT End of Session - 04/08/22 0802     Visit Number 20    Number of Visits 36    Date for PT Re-Evaluation 06/03/22    Authorization Type [employer] Medicare    Progress Note Due on Visit 38    PT Start Time 0802    PT Stop Time 0858    PT Time Calculation (min) 56 min    Activity Tolerance Patient tolerated treatment well    Behavior During Therapy WFL for tasks assessed/performed             Past Medical History:  Diagnosis Date   Arthritis    Complication of anesthesia    as a child vomitted after tonsils removed   Diabetes mellitus without complication ([place])    Hypertension    Neuropathy    Past Surgical History:  Procedure Laterality Date   APPENDECTOMY     CATARACT EXTRACTION W/ INTRAOCULAR LENS IMPLANT     CESAREAN SECTION     INNER EAR SURGERY     ORIF HUMERUS FRACTURE Left 12/28/2021   Procedure: LEFT OPEN REDUCTION INTERNAL FIXATION (ORIF) HUMERAL SHAFT FRACTURE;  Surgeon: Vanetta Mulders, MD;  Location: [facility];  Service: Orthopedics;  Laterality: Left;   TONSILLECTOMY     Patient Active Problem List   Diagnosis Date Noted   Humeral shaft fracture 12/28/2021   Closed displaced comminuted fracture of shaft of left humerus    PCP: Dorothy Spark, FNP  REFERRING PROVIDER: Vanetta Mulders, MD  REFERRING DIAG: [phone] (ICD-10-CM) - Closed displaced comminuted fracture of shaft of left humerus  THERAPY DIAG:  Pain of left upper arm  Acute pain of left shoulder  Stiffness of left shoulder, not elsewhere classified  Muscle weakness (generalized)  Abnormal posture  RATIONALE FOR EVALUATION AND TREATMENT: Rehabilitation  ONSET DATE: Closed displaced comminuted fracture of shaft of left humerus 07/25/21 s/p ORIF on 12/28/21  PERTINENT HISTORY: DM, HTN   PRECAUTIONS: none  SUBJECTIVE:   Caitlin Odonnell reports increased pain today w/o known trigger other than maybe doing her band exercises yesterday. She reports she works on her HEP at least 3x/day at work including the resistance band exercises. Pt reports a feeling that her bone wants to fall out of her elbow when she raises her L arm. She had some relief from the initial ionto patch but not as much from the 2nd application. She is now able to place her hand on the steering wheel but still lacks control beyond that.  PAIN:  Are you having pain? Yes: NPRS scale:  6/10 Pain location: L anterior shoulder Pain description: stabbiing   OBJECTIVE: (objective measures completed at initial evaluation unless otherwise dated)     AROM     04/08/2022    Left Shoulder Flexion 109 Degrees   in sitting; 130 in supine    Left Shoulder ABduction 86 Degrees   in sitting, 122 in supine    Left Shoulder Internal Rotation 82 Degrees     Left Shoulder External Rotation 56 Degrees       Strength     04/08/2022    Left Shoulder Flexion 3+/5   for available ROM    Left Shoulder ABduction 3+/5   for available ROM    Left Shoulder Internal Rotation 4+/5     Left Shoulder External Rotation 4-/5  TODAY'S TREATMENT:  04/08/22 THERAPEUTIC EXERCISE: to improve flexibility, strength and mobility.  Verbal and tactile cues throughout for technique. UBE - L3.0 x 6 min (3 min each fwd & back)  Supine L shoulder A/AAROM with 1# weight bar 2 x 10 - PT mobilizing scapula into retraction and depression  MANUAL THERAPY: To promote normalized muscle tension, improved flexibility, improved joint mobility, increased ROM, and reduced pain. STM/DTM & manual TPR to L biceps, ant deltoid, LS L shoulder flexion & abduction PROM while facilitating scap retraction & depression  MWM into L shoulder flexion with PT mobilizing L scapula into retraction and depression during A/AAROM L shoulder flexion & abduction/scaption  MODALITIES:  Vasopneumatic compression - 10  minutes on low pressure to L shoulder at 34  PATIENT EDUCATION: Education details:  HEP clarification - Avoid forcing A/AAROM into painful motions, need to back off on theraband resisted exercises to reduce overuse inflammation - resisted scapular exercise only 1x/day and RTC exercises only 3x/wk Person educated: Patient Education method: Explanation Education comprehension: verbalized understanding   HOME EXERCISE PROGRAM: [id number]  ASSESSMENT:   CLINICAL IMPRESSION:  Caitlin Odonnell reports good benefit from initial ionto patch with decreased pain and improving functional ROM but states less response from 2nd application. Pain continues to fluctuate w/o specific triggers identified but at least in part in response to HEP performance. Per pt report she is performing HEP several times a day while at work including band resisted exercises. This combined with her tendency to demonstrate excessive shoulder hike and protraction with attempts at AROM into overhead planes is likely leading to overuse inflammation along with irritation due to likely impingement. Patient reminded not to force motion into painful range and counselled to decrease HEP frequency and intensity to reduce ongoing inflammation and irritation. Therapeutic interventions today focusing on MT and modalities to reduce current abnormal muscle tension and inflammation as well as promoting improve coordinated scapular muscle activation to improve GH mechanics and reduce risk for impingement while promoting increased ROM - pt able to achieve ~10 more motion in overhead planes while in supine with improved scapular muscle engagement but still limited carryover into gravity resisted planes. Given ongoing pain and limited functional L shoulder ROM and strength, will recommend PT recert for additional 6-7R/FF for up to 6-8 weeks.    PT Short Term Goals - 03/01/22 0857       PT SHORT TERM GOAL #1   Title Patient will be independent with initial HEP     Status Achieved   01/29/22   Target Date 02/05/22      PT SHORT TERM GOAL #2   Title Improve posture and alignment with patient to demonstrate improved upright posture with posterior shoulder girdle engaged    Status Achieved   03/01/22   Target Date 02/26/22      PT SHORT TERM GOAL #3   Title Patient to improve L shoulder AROM to >/= 90 flexion and scaption/abduction to increase functional L UE use    Status Achieved   02/17/22 - met in supine   Target Date 02/26/22              PT Long Term Goals - 04/08/22 0805       PT LONG TERM GOAL #1   Title Patient will be independent with ongoing/advanced HEP for self-management at home in order to build upon functional gains in therapy    Status On-going    Target Date 06/03/22      PT LONG  TERM GOAL #2   Title Decrease pain in the L shoulder/proximal UE by >/= 50% allowing patient to use L UE for functional activities    Status On-going   04/08/22 - pain continues to fluctuate (some relief initially from ionto patches but than pain increasing - suspect overuse irritation from excessive HEP performance)   Target Date 06/03/22      PT LONG TERM GOAL #3   Title Patient to improve L shoulder AROM to [facility] without pain provocation    Status On-going   04/08/22 - ROM slowly improving but still limited by pain and substitution movement   Target Date 06/03/22      PT LONG TERM GOAL #4   Title Patient will demonstrate improved L shoulder strength to >/= 4/5 for functional UE use    Status On-going   04/08/22 - per strength/MMT chart   Target Date 06/03/22      PT LONG TERM GOAL #5   Title Patient to report ability to perform ADLs, household, and work-related tasks without limitation due to L UE/shoulder pain, LOM or weakness    Status On-going   04/08/22 - pt notes increased ability to reach arm OH into cabinets as well as placing hand on steering wheel in car but still limited control noted   Target Date 06/03/22             PLAN:    Personal Factors and Comorbidities Comorbidity 2;Past/Current Experience;Time since onset of injury/illness/exacerbation;Fitness    Comorbidities DM, HTN    Examination-Activity Limitations Bathing;Bed Mobility;Caring for Others;Carry;Dressing;Hygiene/Grooming;Lift;Reach Overhead;Sleep    Examination-Participation Restrictions Cleaning;Community Activity;Laundry;Meal Prep;Occupation;Shop;Yard Work;Driving    Rehab Potential Good    PT Frequency 1-2x/wk   PT Duration 8 weeks    PT Treatment/Interventions ADLs/Self Care Home Management;Cryotherapy;Electrical Stimulation;Iontophoresis 74m/ml Dexamethasone;Moist Heat;Ultrasound;Therapeutic activities;Therapeutic exercise;Neuromuscular re-education;Patient/family education;Manual techniques;Passive range of motion;Dry needling;Taping;Vasopneumatic Device;Joint Manipulations    PT Next Visit Plan rehab per UE ORIF rehab protocol - post-op week #16 as of 04/12/22 (Sx 12/28/21); ionto patch to L anterior shoulder as indicated and continued benefit noted    PT Home Exercise Plan Access Code: [email or web address]   Consulted and Agree with Plan of Care Patient          JPercival Spanish PT 04/08/2022, 9:53 AM

## 2022-04-14 ENCOUNTER — Encounter: Payer: Self-pay | Admitting: Physical Therapy

## 2022-04-14 ENCOUNTER — Ambulatory Visit: Payer: Medicare Other | Admitting: Physical Therapy

## 2022-04-14 DIAGNOSIS — M25512 Pain in left shoulder: Secondary | ICD-10-CM

## 2022-04-14 DIAGNOSIS — M79622 Pain in left upper arm: Secondary | ICD-10-CM | POA: Diagnosis not present

## 2022-04-14 DIAGNOSIS — R293 Abnormal posture: Secondary | ICD-10-CM

## 2022-04-14 DIAGNOSIS — M6281 Muscle weakness (generalized): Secondary | ICD-10-CM

## 2022-04-14 DIAGNOSIS — M25612 Stiffness of left shoulder, not elsewhere classified: Secondary | ICD-10-CM

## 2022-04-14 NOTE — Therapy (Signed)
OUTPATIENT PHYSICAL THERAPY TREATMENT NOTE   Patient Name: Caitlin Odonnell MRN: [id number] DOB:[birth year], 66 y.o., female Today's Date: 04/14/2022    PT End of Session - 04/14/22 0803     Visit Number 21    Number of Visits 36    Date for PT Re-Evaluation 06/03/22    Authorization Type [employer] Medicare    Progress Note Due on Visit 30    PT Start Time 0803    PT Stop Time 0846    PT Time Calculation (min) 43 min    Activity Tolerance Patient tolerated treatment well    Behavior During Therapy WFL for tasks assessed/performed              Past Medical History:  Diagnosis Date   Arthritis    Complication of anesthesia    as a child vomitted after tonsils removed   Diabetes mellitus without complication ([place])    Hypertension    Neuropathy    Past Surgical History:  Procedure Laterality Date   APPENDECTOMY     CATARACT EXTRACTION W/ INTRAOCULAR LENS IMPLANT     CESAREAN SECTION     INNER EAR SURGERY     ORIF HUMERUS FRACTURE Left 12/28/2021   Procedure: LEFT OPEN REDUCTION INTERNAL FIXATION (ORIF) HUMERAL SHAFT FRACTURE;  Surgeon: Vanetta Mulders, MD;  Location: [facility];  Service: Orthopedics;  Laterality: Left;   TONSILLECTOMY     Patient Active Problem List   Diagnosis Date Noted   Humeral shaft fracture 12/28/2021   Closed displaced comminuted fracture of shaft of left humerus    PCP: Dorothy Spark, FNP  REFERRING PROVIDER: Vanetta Mulders, MD  REFERRING DIAG: [phone] (ICD-10-CM) - Closed displaced comminuted fracture of shaft of left humerus  THERAPY DIAG:  Pain of left upper arm  Acute pain of left shoulder  Stiffness of left shoulder, not elsewhere classified  Muscle weakness (generalized)  Abnormal posture  RATIONALE FOR EVALUATION AND TREATMENT: Rehabilitation  ONSET DATE: Closed displaced comminuted fracture of shaft of left humerus 07/25/21 s/p ORIF on 12/28/21  PERTINENT HISTORY: DM, HTN   PRECAUTIONS: none  SUBJECTIVE:  Caitlin Odonnell continues to  report moderate to high pain levels despite being more conscious about keeping her shoulders back and cutting back on her HEP as recommended.  PAIN:  Are you having pain? Yes: NPRS scale:  5/10 Pain location: L anterior shoulder Pain description: stabbiing   OBJECTIVE: (objective measures completed at initial evaluation unless otherwise dated)     AROM     04/08/2022    Left Shoulder Flexion 109 in sitting 130 in supine    Left Shoulder ABduction 86 in sitting  122 in supine    Left Shoulder Internal Rotation 82 Degrees     Left Shoulder External Rotation 56 Degrees       Strength     04/08/2022    Left Shoulder Flexion 3+/5  for available ROM    Left Shoulder ABduction 3+/5  for available ROM    Left Shoulder Internal Rotation 4+/5     Left Shoulder External Rotation 4-/5        TODAY'S TREATMENT:  04/14/22 THERAPEUTIC EXERCISE: to improve flexibility, strength and mobility.  Verbal and tactile cues throughout for technique. UBE - L3.0 x 6 min (3 min each fwd & back)  Seated A/AAROM L shoulder flexion and scaption with PT assisting with MWM for scap retraction and depression 4 x 5 reps each Lat pulldown 15# 2 x 10 - emphasis on scap retraction &  depression with pulldown and maintaining scapular muscle activation on eccentric release L shoulder flexion wall ladder x 10 - PT providing facilitation for scap retraction & depression on latter reps with pt noting less pain and better motion L shoulder CW/CCW circles with small ball on wall while maintaining scap retraction & depression 2 x 10 Wall pushups 2 x 10  MANUAL THERAPY: To promote normalized muscle tension, improved flexibility, improved joint mobility, increased ROM, and reduced pain. STM/DTM & manual TPR to L pecs, esp pec minor, teres group and subscapularis MWM into L shoulder flexion with PT mobilizing L scapula into retraction and depression during A/AAROM L shoulder flexion & abduction/scaption   MODALITIES:   Ionto Patch (#3 of 6) to L anterior shoulder - Dexamethasone 1 mL, 80 mA-min, 4-6 hr wear time    04/08/22 THERAPEUTIC EXERCISE: to improve flexibility, strength and mobility.  Verbal and tactile cues throughout for technique. UBE - L3.0 x 6 min (3 min each fwd & back)  Supine L shoulder A/AAROM with 1# weight bar 2 x 10 - PT mobilizing scapula into retraction and depression   MANUAL THERAPY: To promote normalized muscle tension, improved flexibility, improved joint mobility, increased ROM, and reduced pain. STM/DTM & manual TPR to L biceps, ant deltoid, LS L shoulder flexion & abduction PROM while facilitating scap retraction & depression  MWM into L shoulder flexion with PT mobilizing L scapula into retraction and depression during A/AAROM L shoulder flexion & abduction/scaption   MODALITIES:  Vasopneumatic compression - 10 minutes on low pressure to L shoulder at 34  PATIENT EDUCATION: Education details:  HEP clarification - Avoid forcing A/AAROM into painful motions, need to back off on theraband resisted exercises to reduce overuse inflammation - resisted scapular exercise only 1x/day and RTC exercises only 3x/wk Person educated: Patient Education method: Explanation Education comprehension: verbalized understanding   HOME EXERCISE PROGRAM: [id number]  ASSESSMENT:   CLINICAL IMPRESSION:   Caitlin Odonnell continues to report moderate to high pain levels with sensation that her arm feels like it want to drop off when she tries to raise it overhead. We have repeatedly cautioned her to avoid overdoing her HEP especially forcing motion into increased pain - she reports having backed off some but suspect that she is still pushing to hard given ongoing level of irritation. She continues to require extensive cueing and facilitation to achieve proper scapular activation during attempts at overhead shoulder ROM but does note less pain and improved motions when scapular motion better engaged. 3rd ionto  patch applied to conclude session today to promote reduction in pain and inflammation, with pt again reminded to avoid excessive performance of HEP as well as trying to force painful motions.     PT Short Term Goals - 03/01/22 0857       PT SHORT TERM GOAL #1   Title Patient will be independent with initial HEP    Status Achieved   01/29/22   Target Date 02/05/22      PT SHORT TERM GOAL #2   Title Improve posture and alignment with patient to demonstrate improved upright posture with posterior shoulder girdle engaged    Status Achieved   03/01/22   Target Date 02/26/22      PT SHORT TERM GOAL #3   Title Patient to improve L shoulder AROM to >/= 90 flexion and scaption/abduction to increase functional L UE use    Status Achieved   02/17/22 - met in supine   Target Date 02/26/22  PT Long Term Goals - 04/08/22 0805       PT LONG TERM GOAL #1   Title Patient will be independent with ongoing/advanced HEP for self-management at home in order to build upon functional gains in therapy    Status On-going    Target Date 06/03/22      PT LONG TERM GOAL #2   Title Decrease pain in the L shoulder/proximal UE by >/= 50% allowing patient to use L UE for functional activities    Status On-going   04/08/22 - pain continues to fluctuate (some relief initially from ionto patches but than pain increasing - suspect overuse irritation from excessive HEP performance)   Target Date 06/03/22      PT LONG TERM GOAL #3   Title Patient to improve L shoulder AROM to [facility] without pain provocation    Status On-going   04/08/22 - ROM slowly improving but still limited by pain and substitution movement   Target Date 06/03/22      PT LONG TERM GOAL #4   Title Patient will demonstrate improved L shoulder strength to >/= 4/5 for functional UE use    Status On-going   04/08/22 - per strength/MMT chart   Target Date 06/03/22      PT LONG TERM GOAL #5   Title Patient to report ability to perform ADLs,  household, and work-related tasks without limitation due to L UE/shoulder pain, LOM or weakness    Status On-going   04/08/22 - pt notes increased ability to reach arm OH into cabinets as well as placing hand on steering wheel in car but still limited control noted   Target Date 06/03/22             PLAN:   Personal Factors and Comorbidities Comorbidity 2;Past/Current Experience;Time since onset of injury/illness/exacerbation;Fitness    Comorbidities DM, HTN    Examination-Activity Limitations Bathing;Bed Mobility;Caring for Others;Carry;Dressing;Hygiene/Grooming;Lift;Reach Overhead;Sleep    Examination-Participation Restrictions Cleaning;Community Activity;Laundry;Meal Prep;Occupation;Shop;Yard Work;Driving    Rehab Potential Good    PT Frequency 1-2x/wk   PT Duration 8 weeks    PT Treatment/Interventions ADLs/Self Care Home Management;Cryotherapy;Electrical Stimulation;Iontophoresis 68m/ml Dexamethasone;Moist Heat;Ultrasound;Therapeutic activities;Therapeutic exercise;Neuromuscular re-education;Patient/family education;Manual techniques;Passive range of motion;Dry needling;Taping;Vasopneumatic Device;Joint Manipulations    PT Next Visit Plan rehab per UE ORIF rehab protocol - post-op week #17 as of 04/19/22 (Sx 12/28/21); ionto patch to L anterior shoulder as indicated and continued benefit noted    PT Home Exercise Plan Access Code: [email or web address]   Consulted and Agree with Plan of Care Patient          JPercival Spanish PT 04/14/2022, 9:25 AM

## 2022-04-21 ENCOUNTER — Ambulatory Visit (INDEPENDENT_AMBULATORY_CARE_PROVIDER_SITE_OTHER): Payer: Medicare Other | Admitting: Orthopaedic Surgery

## 2022-04-21 ENCOUNTER — Ambulatory Visit (INDEPENDENT_AMBULATORY_CARE_PROVIDER_SITE_OTHER): Payer: Medicare Other

## 2022-04-21 DIAGNOSIS — S42352K Displaced comminuted fracture of shaft of humerus, left arm, subsequent encounter for fracture with nonunion: Secondary | ICD-10-CM

## 2022-04-21 DIAGNOSIS — S42352A Displaced comminuted fracture of shaft of humerus, left arm, initial encounter for closed fracture: Secondary | ICD-10-CM | POA: Diagnosis not present

## 2022-04-21 DIAGNOSIS — M7502 Adhesive capsulitis of left shoulder: Secondary | ICD-10-CM | POA: Diagnosis not present

## 2022-04-21 MED ORDER — TRIAMCINOLONE ACETONIDE 40 MG/ML IJ SUSP
80.0000 mg | INTRAMUSCULAR | Status: AC | PRN
  Administered 2022-04-21: 80 mg via INTRA_ARTICULAR

## 2022-04-21 MED ORDER — LIDOCAINE HCL 1 % IJ SOLN
4.0000 mL | INTRAMUSCULAR | Status: AC | PRN
  Administered 2022-04-21: 4 mL

## 2022-04-21 NOTE — Progress Notes (Signed)
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  At today's visit she states that things are moving slowly and she is having pain about the left shoulder with physical therapy.  She appears to have limited shoulder range of motion and is experiencing the majority of her pain about the shoulder joint at this time.  She states that she is limited with overhead activity and forward elevation and overall does feel very heavy.  PMH/PSH/Family History/Social History/Meds/Allergies:    Past Medical History:  Diagnosis Date  . Arthritis   . Complication of anesthesia    as a child vomitted after tonsils removed  . Diabetes mellitus without complication ([facility])   . Hypertension   . Neuropathy    Past Surgical History:  Procedure Laterality Date  . APPENDECTOMY    . CATARACT EXTRACTION W/ INTRAOCULAR LENS IMPLANT    . CESAREAN SECTION    . INNER EAR SURGERY    . ORIF HUMERUS FRACTURE Left 12/28/2021   Procedure: LEFT OPEN REDUCTION INTERNAL FIXATION (ORIF) HUMERAL SHAFT FRACTURE;  Surgeon: Caitlin Cote, MD;  Location: [facility];  Service: Orthopedics;  Laterality: Left;  . TONSILLECTOMY     Social History   Socioeconomic History  . Marital status: Widowed    Spouse name: Not on file  . Number of children: Not on file  . Years of education: Not on file  . Highest education level: Not on file  Occupational History  . Not on file  Tobacco Use  . Smoking status: Never  . Smokeless tobacco: Never  Substance and Sexual Activity  . Alcohol use: Never  . Drug use: Never  . Sexual activity: Not on file  Other Topics Concern  . Not on file  Social History Narrative  . Not on file   Social Determinants of Health   Financial Resource Strain: Not on file   Food Insecurity: Not on file  Transportation Needs: Not on file  Physical Activity: Not on file  Stress: Not on file  Social Connections: Not on file   No family history on file. Allergies  Allergen Reactions  . Cortisone Other (See Comments)    Had some numbness in her face. States it happened after a cortisone injection in her knee, had never had reaction to it prior to this incident.    Current Outpatient Medications  Medication Sig Dispense Refill  . amLODipine (NORVASC) 5 MG tablet Take 5 mg by mouth at bedtime.    Marland Kitchen aspirin EC 325 MG tablet Take 1 tablet (325 mg total) by mouth daily. 30 tablet 0  . hydrOXYzine (ATARAX) 25 MG tablet Take 25 mg by mouth at bedtime.    Marland Kitchen ibuprofen (ADVIL) 200 MG tablet Take 400-600 mg by mouth every 8 (eight) hours as needed for moderate pain.    Marland Kitchen lisinopril-hydrochlorothiazide (ZESTORETIC) 20-25 MG tablet Take 1 tablet by mouth daily.    . metFORMIN (GLUCOPHAGE) 1000 MG tablet Take 1,000 mg by mouth daily with breakfast.    . MOUNJARO 2.5 MG/0.5ML Pen Inject 2.5 mg into the skin every Tuesday.    Marland Kitchen oxyCODONE (OXY IR/ROXICODONE) 5 MG immediate release tablet Take 1  tablet (5 mg total) by mouth every 4 (four) hours as needed (severe pain). 20 tablet 0  . pregabalin (LYRICA) 50 MG capsule Take 50 mg by mouth at bedtime. Pt may take a 2nd dose throughout the day if needed    . simvastatin (ZOCOR) 5 MG tablet Take 5 mg by mouth at bedtime.     No current facility-administered medications for this visit.   No results found.  Review of Systems:   A ROS was performed including pertinent positives and negatives as documented in the HPI.   Musculoskeletal Exam:    There were no vitals taken for this visit.  Left incision is well-healed.  She able flex and extend at the left elbow.  She able to fire EPL as well as the wrist extensors.  2+ radial pulse.  Fires interosseous muscles.  Active forward elevation is to 100 degrees.  External rotation at  the side is to 45 degrees  Imaging:    Left humerus 2 view x-ray: Status post open reduction internal fixation without evidence of hardware failure increasing callus formation about the fracture site  I personally reviewed and interpreted the radiographs.   Assessment:   66 year old female status post left humerus open reduction internal fixation now 3-1/2 months out.  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  To that effect I recommended ultrasound-guided injection of the left glenohumeral joint.  We will plan to perform this today and I will see her back in 4 weeks to reassess following her closely to see if this improves.  Plan :    -Plan for left shoulder ultrasound-guided injection performed today after verbal consent obtained    Procedure Note  Patient: Caitlin Odonnell             Date of Birth: 13-Apr-1956           MRN: [id number]             Visit Date: 04/21/2022  Procedures: Visit Diagnoses:  1. 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 Consent was given by the patient. Immediately prior to procedure a time out was called to verify the correct patient, procedure, equipment, support staff and site/side marked as required. Patient was prepped and draped in the usual sterile fashion.          I personally saw and evaluated the patient, and participated in the management and treatment plan.  Caitlin Cote, MD Attending Physician, Orthopedic Surgery  This document was dictated  using Dragon voice recognition software. A reasonable attempt at proof reading has been made to minimize errors.  PT

## 2022-04-26 ENCOUNTER — Ambulatory Visit: Payer: Medicare Other

## 2022-04-26 DIAGNOSIS — M25512 Pain in left shoulder: Secondary | ICD-10-CM

## 2022-04-26 DIAGNOSIS — M79622 Pain in left upper arm: Secondary | ICD-10-CM

## 2022-04-26 DIAGNOSIS — R293 Abnormal posture: Secondary | ICD-10-CM

## 2022-04-26 DIAGNOSIS — M25612 Stiffness of left shoulder, not elsewhere classified: Secondary | ICD-10-CM

## 2022-04-26 DIAGNOSIS — M6281 Muscle weakness (generalized): Secondary | ICD-10-CM

## 2022-05-10 ENCOUNTER — Ambulatory Visit: Payer: Medicare Other | Attending: Orthopaedic Surgery

## 2022-05-10 DIAGNOSIS — R293 Abnormal posture: Secondary | ICD-10-CM | POA: Diagnosis present

## 2022-05-10 DIAGNOSIS — M6281 Muscle weakness (generalized): Secondary | ICD-10-CM | POA: Insufficient documentation

## 2022-05-10 DIAGNOSIS — M25512 Pain in left shoulder: Secondary | ICD-10-CM | POA: Insufficient documentation

## 2022-05-10 DIAGNOSIS — M25612 Stiffness of left shoulder, not elsewhere classified: Secondary | ICD-10-CM | POA: Insufficient documentation

## 2022-05-10 DIAGNOSIS — M79622 Pain in left upper arm: Secondary | ICD-10-CM | POA: Insufficient documentation

## 2022-05-10 NOTE — Therapy (Signed)
OUTPATIENT PHYSICAL THERAPY TREATMENT NOTE   Patient Name: Caitlin Odonnell MRN: [id number] DOB:02-Jul-1956, 66 y.o., female Today's Date: 05/10/2022    PT End of Session - 05/10/22 0852     Visit Number 23    Number of Visits 36    Date for PT Re-Evaluation 06/03/22    Authorization Type [employer] Medicare    Progress Note Due on Visit 28    PT Start Time 0801    PT Stop Time 0848    PT Time Calculation (min) 47 min    Activity Tolerance Patient tolerated treatment well    Behavior During Therapy WFL for tasks assessed/performed                Past Medical History:  Diagnosis Date   Arthritis    Complication of anesthesia    as a child vomitted after tonsils removed   Diabetes mellitus without complication ([place])    Hypertension    Neuropathy    Past Surgical History:  Procedure Laterality Date   APPENDECTOMY     CATARACT EXTRACTION W/ INTRAOCULAR LENS IMPLANT     CESAREAN SECTION     INNER EAR SURGERY     ORIF HUMERUS FRACTURE Left 12/28/2021   Procedure: LEFT OPEN REDUCTION INTERNAL FIXATION (ORIF) HUMERAL SHAFT FRACTURE;  Surgeon: Vanetta Mulders, MD;  Location: [place];  Service: Orthopedics;  Laterality: Left;   TONSILLECTOMY     Patient Active Problem List   Diagnosis Date Noted   Humeral shaft fracture 12/28/2021   Closed displaced comminuted fracture of shaft of left humerus    PCP: Dorothy Spark, FNP  REFERRING PROVIDER: Vanetta Mulders, MD  REFERRING DIAG: [phone] (ICD-10-CM) - Closed displaced comminuted fracture of shaft of left humerus  THERAPY DIAG:  Pain of left upper arm  Acute pain of left shoulder  Stiffness of left shoulder, not elsewhere classified  Muscle weakness (generalized)  Abnormal posture  RATIONALE FOR EVALUATION AND TREATMENT: Rehabilitation  ONSET DATE: Closed displaced comminuted fracture of shaft of left humerus 07/25/21 s/p ORIF on 12/28/21  PERTINENT HISTORY: DM, HTN   PRECAUTIONS: none  SUBJECTIVE:  Pt reports  using her shoulder a lot more, sometimes it does not hurt but with more use she notices more pain.  PAIN:  Are you having pain? Yes: NPRS scale:  5/10 Pain location: L anterior shoulder Pain description: stabbiing   OBJECTIVE: (objective measures completed at initial evaluation unless otherwise dated)     AROM     04/08/2022 05/10/22   Left Shoulder Flexion 109 in sitting 130 in supine 131 deg supine 113 deg sitting   Left Shoulder ABduction 86 in sitting  122 in supine 125 deg S/L 98 deg sitting   Left Shoulder Internal Rotation 82 Degrees     Left Shoulder External Rotation 56 Degrees       Strength     04/08/2022 04/26/22   Left Shoulder Flexion 3+/5  for available ROM 4-/5   Left Shoulder ABduction 3+/5  for available ROM 4-/5   Left Shoulder Internal Rotation 4+/5  4+/5   Left Shoulder External Rotation 4-/5  4-/5      TODAY'S TREATMENT:  05/10/22 Therapeutic Exercise: UBE - L3.5 x 69mn (374m fwd & back) Supine L shoulder flexion with 2# weight 10x S/L L shld abduction 12 reps with green weight ball S/L L shld ER 12 reps with green weight ball   Seated lat pull 25# 10 reps Cybex row 30# 20 reps  Manual Therapy:  PROM to L shoulder with prolonged holds each direction STM to L biceps tendon, ant deltoid, pecs Distal scar mobilization   Modalities:  Ionto Patch (#4 of 6) to L anterior shoulder - Dexamethasone 1 mL, 80 mA-min, 4-6 hr wear time  04/26/22 Therapeutic Exercise: UBE - L3.5 x 40mn (3104m fwd & back) L shoulder flexion in supine 10 reps L shoulder abduction in S/L 10 reps Row machine 20# 2x10; 10# with L UE 10 reps Wall push ups 20 reps Tried horizontal abd with yellow TB at doorframe - pt limited by pain  Manual Therapy: STM to L biceps, ant deltoid, pecs with AROM   MODALITIES:  Vasopneumatic compression - 10 minutes on low pressure to L shoulder at 34  04/14/22 THERAPEUTIC EXERCISE: to improve flexibility, strength and mobility.  Verbal and  tactile cues throughout for technique. UBE - L3.0 x 6 min (3 min each fwd & back)  Seated A/AAROM L shoulder flexion and scaption with PT assisting with MWM for scap retraction and depression 4 x 5 reps each Lat pulldown 15# 2 x 10 - emphasis on scap retraction & depression with pulldown and maintaining scapular muscle activation on eccentric release L shoulder flexion wall ladder x 10 - PT providing facilitation for scap retraction & depression on latter reps with pt noting less pain and better motion L shoulder CW/CCW circles with small ball on wall while maintaining scap retraction & depression 2 x 10 Wall pushups 2 x 10  MANUAL THERAPY: To promote normalized muscle tension, improved flexibility, improved joint mobility, increased ROM, and reduced pain. STM/DTM & manual TPR to L pecs, esp pec minor, teres group and subscapularis MWM into L shoulder flexion with PT mobilizing L scapula into retraction and depression during A/AAROM L shoulder flexion & abduction/scaption   MODALITIES:  Ionto Patch (#3 of 6) to L anterior shoulder - Dexamethasone 1 mL, 80 mA-min, 4-6 hr wear time   PATIENT EDUCATION: Education details:  HEP clarification - Avoid forcing A/AAROM into painful motions, need to back off on theraband resisted exercises to reduce overuse inflammation - resisted scapular exercise only 1x/day and RTC exercises only 3x/wk Person educated: Patient Education method: Explanation Education comprehension: verbalized understanding   HOME EXERCISE PROGRAM: [id number]:   CLINICAL IMPRESSION:  Pt demonstrated improvement in L shoulder AROM after PROM and STM. Able to progress supine and sidelying exercises with light resistance. Pt still requiring cues to avoid overusing shoulder to decrease irritation to RTC and shoulder joint. Ended session with ionto patch to L ant shoulder to decrease pain and inflammation.    PT Short Term Goals - 03/01/22 0857       PT SHORT TERM GOAL  #1   Title Patient will be independent with initial HEP    Status Achieved   01/29/22   Target Date 02/05/22      PT SHORT TERM GOAL #2   Title Improve posture and alignment with patient to demonstrate improved upright posture with posterior shoulder girdle engaged    Status Achieved   03/01/22   Target Date 02/26/22      PT SHORT TERM GOAL #3   Title Patient to improve L shoulder AROM to >/= 90 flexion and scaption/abduction to increase functional L UE use    Status Achieved   02/17/22 - met in supine   Target Date 02/26/22              PT Long Term Goals - 04/08/22 0805  PT LONG TERM GOAL #1   Title Patient will be independent with ongoing/advanced HEP for self-management at home in order to build upon functional gains in therapy    Status On-going    Target Date 06/03/22      PT LONG TERM GOAL #2   Title Decrease pain in the L shoulder/proximal UE by >/= 50% allowing patient to use L UE for functional activities    Status On-going   04/08/22 - pain continues to fluctuate (some relief initially from ionto patches but than pain increasing - suspect overuse irritation from excessive HEP performance)   Target Date 06/03/22      PT LONG TERM GOAL #3   Title Patient to improve L shoulder AROM to [facility] without pain provocation    Status On-going   05/10/22 - ROM slowly improving but still limited by pain and substitution movement   Target Date 06/03/22      PT LONG TERM GOAL #4   Title Patient will demonstrate improved L shoulder strength to >/= 4/5 for functional UE use    Status On-going   04/08/22 - per strength/MMT chart   Target Date 06/03/22      PT LONG TERM GOAL #5   Title Patient to report ability to perform ADLs, household, and work-related tasks without limitation due to L UE/shoulder pain, LOM or weakness    Status On-going   04/08/22 - pt notes increased ability to reach arm OH into cabinets as well as placing hand on steering wheel in car but still limited control  noted   Target Date 06/03/22             PLAN:   Personal Factors and Comorbidities Comorbidity 2;Past/Current Experience;Time since onset of injury/illness/exacerbation;Fitness    Comorbidities DM, HTN    Examination-Activity Limitations Bathing;Bed Mobility;Caring for Others;Carry;Dressing;Hygiene/Grooming;Lift;Reach Overhead;Sleep    Examination-Participation Restrictions Cleaning;Community Activity;Laundry;Meal Prep;Occupation;Shop;Yard Work;Driving    Rehab Potential Good    PT Frequency 1-2x/wk   PT Duration 8 weeks    PT Treatment/Interventions ADLs/Self Care Home Management;Cryotherapy;Electrical Stimulation;Iontophoresis 30m/ml Dexamethasone;Moist Heat;Ultrasound;Therapeutic activities;Therapeutic exercise;Neuromuscular re-education;Patient/family education;Manual techniques;Passive range of motion;Dry needling;Taping;Vasopneumatic Device;Joint Manipulations    PT Next Visit Plan rehab per UE ORIF rehab protocol - post-op week #17 as of 04/19/22 (Sx 12/28/21); ionto patch to L anterior shoulder as indicated and continued benefit noted    PT Home Exercise Plan Access Code: [email or web address]   Consulted and Agree with Plan of Care Patient          BArtist Pais PTA 05/10/2022, 9:00 AM

## 2022-05-13 ENCOUNTER — Ambulatory Visit: Payer: Medicare Other | Admitting: Physical Therapy

## 2022-05-18 NOTE — Therapy (Signed)
OUTPATIENT PHYSICAL THERAPY TREATMENT NOTE   Patient Name: Caitlin Odonnell MRN: [id number] DOB:[birth year], 66 y.o., female Today's Date: 05/20/2022    PT End of Session - 05/20/22 0932     Visit Number 24    Number of Visits 36    Date for PT Re-Evaluation 06/03/22    Authorization Type [employer] Medicare    Progress Note Due on Visit 85    PT Start Time 0848    PT Stop Time 0933    PT Time Calculation (min) 45 min    Activity Tolerance Patient tolerated treatment well    Behavior During Therapy WFL for tasks assessed/performed                 Past Medical History:  Diagnosis Date   Arthritis    Complication of anesthesia    as a child vomitted after tonsils removed   Diabetes mellitus without complication ([place])    Hypertension    Neuropathy    Past Surgical History:  Procedure Laterality Date   APPENDECTOMY     CATARACT EXTRACTION W/ INTRAOCULAR LENS IMPLANT     CESAREAN SECTION     INNER EAR SURGERY     ORIF HUMERUS FRACTURE Left 12/28/2021   Procedure: LEFT OPEN REDUCTION INTERNAL FIXATION (ORIF) HUMERAL SHAFT FRACTURE;  Surgeon: Caitlin Mulders, MD;  Location: [place];  Service: Orthopedics;  Laterality: Left;   TONSILLECTOMY     Patient Active Problem List   Diagnosis Date Noted   Humeral shaft fracture 12/28/2021   Closed displaced comminuted fracture of shaft of left humerus    PCP: Caitlin Spark, FNP  REFERRING PROVIDER: Vanetta Mulders, MD  REFERRING DIAG: [phone] (ICD-10-CM) - Closed displaced comminuted fracture of shaft of left humerus  THERAPY DIAG:  Pain of left upper arm  Acute pain of left shoulder  Stiffness of left shoulder, not elsewhere classified  Muscle weakness (generalized)  Abnormal posture  RATIONALE FOR EVALUATION AND TREATMENT: Rehabilitation  ONSET DATE: Closed displaced comminuted fracture of shaft of left humerus 07/25/21 s/p ORIF on 12/28/21  PERTINENT HISTORY: DM, HTN   PRECAUTIONS: none  SUBJECTIVE:  Patient  reports some aching today. Had injection yesterday at MD. "He told me it was okay to come to PT today because it's the only thing that's going to help". I want to be able to put my hair in a pony tail.  PAIN:  Are you having pain? Yes: NPRS scale: 4/10 Pain location: L anterior shoulder Pain description: aching; tightness in humerus    OBJECTIVE: (objective measures completed at initial evaluation unless otherwise dated)     AROM      04/08/2022 05/10/22 05/20/22   Left Shoulder Flexion 109 in sitting 130 in supine 131 deg supine 113 deg sitting   108 deg sitting   Left Shoulder ABduction 86 in sitting  122 in supine 125 deg S/L 98 deg sitting    Left Shoulder Internal Rotation 82 Degrees      Left Shoulder External Rotation 56 Degrees        Strength      04/08/2022 04/26/22 05/20/22   Left Shoulder Flexion 3+/5  for available ROM 4-/5    Left Shoulder ABduction 3+/5  for available ROM 4-/5    Left Shoulder Internal Rotation 4+/5  4+/5 4+/5   Left Shoulder External Rotation 4-/5  4-/5 4+/5      TODAY'S TREATMENT:  05/20/22 Therapeutic Exercise: UBE - L3.5 x 49mn (318m fwd & back) Supine flex  and ER (hand behind head increased pain today) SDLY horizontal ABD x 8 causes increased shoulder pain to 8/10 Seated biceps curls L 4 # 2 x 10, Hammer curls 2 x 10 Cybex row 30# 2x10 Scap squeeze on noodle against wall x 10  Manual Therapy:   IASTM and STM to left upper arm, pectorals, deltoids  Rhythmic stabilization at 90 deg flexion in supine all planes  PNF D2 flex/ext with resistance in supine x 10 ea   PROM limited today by crepitis/pain   05/10/22 Therapeutic Exercise: UBE - L3.5 x 1012mn (363m fwd & back) Supine L shoulder flexion with 2# weight 10x S/L L shld abduction 12 reps with green weight ball S/L L shld ER 12 reps with green weight ball   Seated lat pull 25# 10 reps Cybex row 30# 20 reps  Manual Therapy: PROM to L shoulder with prolonged holds each  direction STM to L biceps tendon, ant deltoid, pecs Distal scar mobilization  Modalities:  Ionto Patch (#4 of 6) to L anterior shoulder - Dexamethasone 1 mL, 80 mA-min, 4-6 hr wear time   04/26/22 Therapeutic Exercise: UBE - L3.5 x 12m40m(3mi11mwd & back) L shoulder flexion in supine 10 reps L shoulder abduction in S/L 10 reps Row machine 20# 2x10; 10# with L UE 10 reps Wall push ups 20 reps Tried horizontal abd with yellow TB at doorframe - pt limited by pain  Manual Therapy: STM to L biceps, ant deltoid, pecs with AROM   MODALITIES:  Vasopneumatic compression - 10 minutes on low pressure to L shoulder at 34   PATIENT EDUCATION: Education details:  HEP clarification - Avoid forcing A/AAROM into painful motions, need to back off on theraband resisted exercises to reduce overuse inflammation - resisted scapular exercise only 1x/day and RTC exercises only 3x/wk Person educated: Patient Education method: Explanation Education comprehension: verbalized understanding   HOME EXERCISE PROGRAM: [id number]:   CLINICAL IMPRESSION:   Caitlin Odonnell increased soreness in her shoulder joint with active flex/ER and given that she just received her injection yesterday, we worked more on biceps and scapular strength today. She was able to tolerate supine rhythmic stabilization and PNF without pain however.  She continues to have a lot of pain with STM in the upper arm and around her incision. IASTM was tolerated more than manual STM.       PT Short Term Goals - 03/01/22 0857       PT SHORT TERM GOAL #1   Title Patient will be independent with initial HEP    Status Achieved   01/29/22   Target Date 02/05/22      PT SHORT TERM GOAL #2   Title Improve posture and alignment with patient to demonstrate improved upright posture with posterior shoulder girdle engaged    Status Achieved   03/01/22   Target Date 02/26/22      PT SHORT TERM GOAL #3   Title Patient to improve L shoulder  AROM to >/= 90 flexion and scaption/abduction to increase functional L UE use    Status Achieved   02/17/22 - met in supine   Target Date 02/26/22              PT Long Term Goals - 04/08/22 0805       PT LONG TERM GOAL #1   Title Patient will be independent with ongoing/advanced HEP for self-management at home in order to build upon functional gains in therapy  Status On-going    Target Date 06/03/22      PT LONG TERM GOAL #2   Title Decrease pain in the L shoulder/proximal UE by >/= 50% allowing patient to use L UE for functional activities    Status On-going   04/08/22 - pain continues to fluctuate (some relief initially from ionto patches but than pain increasing - suspect overuse irritation from excessive HEP performance)   Target Date 06/03/22      PT LONG TERM GOAL #3   Title Patient to improve L shoulder AROM to [facility] without pain provocation    Status On-going   05/10/22 - ROM slowly improving but still limited by pain and substitution movement   Target Date 06/03/22      PT LONG TERM GOAL #4   Title Patient will demonstrate improved L shoulder strength to >/= 4/5 for functional UE use    Status On-going   04/08/22 - per strength/MMT chart   Target Date 06/03/22      PT LONG TERM GOAL #5   Title Patient to report ability to perform ADLs, household, and work-related tasks without limitation due to L UE/shoulder pain, LOM or weakness    Status On-going   04/08/22 - pt notes increased ability to reach arm OH into cabinets as well as placing hand on steering wheel in car but still limited control noted   Target Date 06/03/22             PLAN:   Personal Factors and Comorbidities Comorbidity 2;Past/Current Experience;Time since onset of injury/illness/exacerbation;Fitness    Comorbidities DM, HTN    Examination-Activity Limitations Bathing;Bed Mobility;Caring for Others;Carry;Dressing;Hygiene/Grooming;Lift;Reach Overhead;Sleep    Examination-Participation Restrictions  Cleaning;Community Activity;Laundry;Meal Prep;Occupation;Shop;Yard Work;Driving    Rehab Potential Good    PT Frequency 1-2x/wk   PT Duration 8 weeks    PT Treatment/Interventions ADLs/Self Care Home Management;Cryotherapy;Electrical Stimulation;Iontophoresis 59m/ml Dexamethasone;Moist Heat;Ultrasound;Therapeutic activities;Therapeutic exercise;Neuromuscular re-education;Patient/family education;Manual techniques;Passive range of motion;Dry needling;Taping;Vasopneumatic Device;Joint Manipulations    PT Next Visit Plan  rehab per UE ORIF rehab protocol - post-op week #17 as of 04/19/22 (Sx 12/28/21); ionto patch to L anterior shoulder as indicated and continued benefit noted    PT Home Exercise Plan Access Code: [email or web address]   Consulted and Agree with Plan of Care Patient          Jenisa Monty, PT 05/20/2022, 5:40 PM

## 2022-05-19 ENCOUNTER — Ambulatory Visit (HOSPITAL_BASED_OUTPATIENT_CLINIC_OR_DEPARTMENT_OTHER): Payer: Medicare Other | Admitting: Orthopaedic Surgery

## 2022-05-19 DIAGNOSIS — S42352K Displaced comminuted fracture of shaft of humerus, left arm, subsequent encounter for fracture with nonunion: Secondary | ICD-10-CM | POA: Diagnosis not present

## 2022-05-19 MED ORDER — LIDOCAINE HCL 1 % IJ SOLN
4.0000 mL | INTRAMUSCULAR | Status: AC | PRN
  Administered 2022-05-19: 4 mL

## 2022-05-19 MED ORDER — TRIAMCINOLONE ACETONIDE 40 MG/ML IJ SUSP
80.0000 mg | INTRAMUSCULAR | Status: AC | PRN
  Administered 2022-05-19: 80 mg via INTRA_ARTICULAR

## 2022-05-19 NOTE — Progress Notes (Signed)
Post Operative Evaluation    Procedure/Date of Surgery: Left humerus open reduction internal fixation 12/29/21  Interval History:   Patient presents follow-up status post left shoulder injection for frozen shoulder.  Overall she is feeling much better after the injection.  PMH/PSH/Family History/Social History/Meds/Allergies:    Past Medical History:  Diagnosis Date   Arthritis    Complication of anesthesia    as a child vomitted after tonsils removed   Diabetes mellitus without complication ([facility])    Hypertension    Neuropathy    Past Surgical History:  Procedure Laterality Date   APPENDECTOMY     CATARACT EXTRACTION W/ INTRAOCULAR LENS IMPLANT     CESAREAN SECTION     INNER EAR SURGERY     ORIF HUMERUS FRACTURE Left 12/28/2021   Procedure: LEFT OPEN REDUCTION INTERNAL FIXATION (ORIF) HUMERAL SHAFT FRACTURE;  Surgeon: Huel Cote, MD;  Location: [facility];  Service: Orthopedics;  Laterality: Left;   TONSILLECTOMY     Social History   Socioeconomic History   Marital status: Widowed    Spouse name: Not on file   Number of children: Not on file   Years of education: Not on file   Highest education level: Not on file  Occupational History   Not on file  Tobacco Use   Smoking status: Never   Smokeless tobacco: Never  Substance and Sexual Activity   Alcohol use: Never   Drug use: Never   Sexual activity: Not on file  Other Topics Concern   Not on file  Social History Narrative   Not on file   Social Determinants of Health   Financial Resource Strain: Not on file  Food Insecurity: Not on file  Transportation Needs: Not on file  Physical Activity: Not on file  Stress: Not on file  Social Connections: Not on file   No family history on file. Allergies  Allergen Reactions   Cortisone Other (See Comments)    Had some numbness in her face. States it happened after a cortisone injection in her knee, had never had reaction to it  prior to this incident.    Current Outpatient Medications  Medication Sig Dispense Refill   amLODipine (NORVASC) 5 MG tablet Take 5 mg by mouth at bedtime.     aspirin EC 325 MG tablet Take 1 tablet (325 mg total) by mouth daily. 30 tablet 0   hydrOXYzine (ATARAX) 25 MG tablet Take 25 mg by mouth at bedtime.     ibuprofen (ADVIL) 200 MG tablet Take 400-600 mg by mouth every 8 (eight) hours as needed for moderate pain.     lisinopril-hydrochlorothiazide (ZESTORETIC) 20-25 MG tablet Take 1 tablet by mouth daily.     metFORMIN (GLUCOPHAGE) 1000 MG tablet Take 1,000 mg by mouth daily with breakfast.     MOUNJARO 2.5 MG/0.5ML Pen Inject 2.5 mg into the skin every Tuesday.     oxyCODONE (OXY IR/ROXICODONE) 5 MG immediate release tablet Take 1 tablet (5 mg total) by mouth every 4 (four) hours as needed (severe pain). 20 tablet 0   pregabalin (LYRICA) 50 MG capsule Take 50 mg by mouth at bedtime. Pt may take a 2nd dose throughout the day if needed     simvastatin (ZOCOR) 5 MG tablet Take 5 mg by mouth at bedtime.  No current facility-administered medications for this visit.   No results found.  Review of Systems:   A ROS was performed including pertinent positives and negatives as documented in the HPI.   Musculoskeletal Exam:    There were no vitals taken for this visit.  Left incision is well-healed.  She able flex and extend at the left elbow.  She able to fire EPL as well as the wrist extensors.  2+ radial pulse.  Fires interosseous muscles.  Active forward elevation is to 120 degrees.  External rotation at the side is to 45 degrees  Imaging:    Left humerus 2 view x-ray: Status post open reduction internal fixation without evidence of hardware failure increasing callus formation about the fracture site  I personally reviewed and interpreted the radiographs.   Assessment:   66 year old female status post left humerus open reduction internal fixation now 4 months out.  Range of  motion is much improved after an injection.  She will continue to work on active range of motion.  I will plan to see her back in 6 weeks with x-rays at that time.  She is also elected for an additional shoulder injection at today's visit as she did get significant relief from previous injection for capsulitis  Plan :    -Plan for left shoulder ultrasound-guided injection performed today after verbal consent obtained -Return to clinic in 6 weeks for reassessment and x-rays    Procedure Note  Patient: Caitlin Odonnell             Date of Birth: June 06, 1956           MRN: [id number]             Visit Date: 05/19/2022  Procedures: Visit Diagnoses:  No diagnosis found.   Large Joint Inj on 05/19/2022 9:27 AM Indications: pain Details: 22 G 1.5 in needle, ultrasound-guided anterior approach  Arthrogram: No  Medications: 4 mL lidocaine 1 %; 80 mg triamcinolone acetonide 40 MG/ML Outcome: tolerated well, no immediate complications Procedure, treatment alternatives, risks and benefits explained, specific risks discussed. Consent was given by the patient. Immediately prior to procedure a time out was called to verify the correct patient, procedure, equipment, support staff and site/side marked as required. Patient was prepped and draped in the usual sterile fashion.          I personally saw and evaluated the patient, and participated in the management and treatment plan.  Huel Cote, MD Attending Physician, Orthopedic Surgery  This document was dictated using Dragon voice recognition software. A reasonable attempt at proof reading has been made to minimize errors.  PT

## 2022-05-20 ENCOUNTER — Ambulatory Visit: Payer: Medicare Other | Admitting: Physical Therapy

## 2022-05-20 ENCOUNTER — Encounter: Payer: Self-pay | Admitting: Physical Therapy

## 2022-05-20 DIAGNOSIS — M25612 Stiffness of left shoulder, not elsewhere classified: Secondary | ICD-10-CM

## 2022-05-20 DIAGNOSIS — M79622 Pain in left upper arm: Secondary | ICD-10-CM

## 2022-05-20 DIAGNOSIS — M6281 Muscle weakness (generalized): Secondary | ICD-10-CM

## 2022-05-20 DIAGNOSIS — R293 Abnormal posture: Secondary | ICD-10-CM

## 2022-05-20 DIAGNOSIS — M25512 Pain in left shoulder: Secondary | ICD-10-CM

## 2022-05-21 ENCOUNTER — Ambulatory Visit: Payer: Medicare Other | Admitting: Physical Therapy

## 2022-05-26 ENCOUNTER — Encounter: Payer: Medicare Other | Admitting: Physical Therapy

## 2022-05-27 ENCOUNTER — Encounter: Payer: Self-pay | Admitting: Physical Therapy

## 2022-05-27 ENCOUNTER — Ambulatory Visit: Payer: Medicare Other | Admitting: Physical Therapy

## 2022-05-27 DIAGNOSIS — M25612 Stiffness of left shoulder, not elsewhere classified: Secondary | ICD-10-CM

## 2022-05-27 DIAGNOSIS — M79622 Pain in left upper arm: Secondary | ICD-10-CM | POA: Diagnosis not present

## 2022-05-27 DIAGNOSIS — R293 Abnormal posture: Secondary | ICD-10-CM

## 2022-05-27 DIAGNOSIS — M6281 Muscle weakness (generalized): Secondary | ICD-10-CM

## 2022-05-27 DIAGNOSIS — M25512 Pain in left shoulder: Secondary | ICD-10-CM

## 2022-05-27 NOTE — Therapy (Signed)
OUTPATIENT PHYSICAL THERAPY TREATMENT NOTE   Patient Name: Caitlin Odonnell MRN: [id number] DOB:27-Mar-1956, 66 y.o., female Today's Date: 05/27/2022    PT End of Session - 05/27/22 0804     Visit Number 25    Number of Visits 36    Date for PT Re-Evaluation 06/03/22    Authorization Type [employer]    Progress Note Due on Visit 81    PT Start Time 0804    PT Stop Time 0853    PT Time Calculation (min) 49 min    Activity Tolerance Patient tolerated treatment well    Behavior During Therapy WFL for tasks assessed/performed                  Past Medical History:  Diagnosis Date   Arthritis    Complication of anesthesia    as a child vomitted after tonsils removed   Diabetes mellitus without complication ([place])    Hypertension    Neuropathy    Past Surgical History:  Procedure Laterality Date   APPENDECTOMY     CATARACT EXTRACTION W/ INTRAOCULAR LENS IMPLANT     CESAREAN SECTION     INNER EAR SURGERY     ORIF HUMERUS FRACTURE Left 12/28/2021   Procedure: LEFT OPEN REDUCTION INTERNAL FIXATION (ORIF) HUMERAL SHAFT FRACTURE;  Surgeon: Vanetta Mulders, MD;  Location: [facility];  Service: Orthopedics;  Laterality: Left;   TONSILLECTOMY     Patient Active Problem List   Diagnosis Date Noted   Humeral shaft fracture 12/28/2021   Closed displaced comminuted fracture of shaft of left humerus    PCP: Dorothy Spark, FNP  REFERRING PROVIDER: Vanetta Mulders, MD  REFERRING DIAG: [id number] (ICD-10-CM) - Closed displaced comminuted fracture of shaft of left humerus  THERAPY DIAG:  Pain of left upper arm  Acute pain of left shoulder  Stiffness of left shoulder, not elsewhere classified  Muscle weakness (generalized)  Abnormal posture  RATIONALE FOR EVALUATION AND TREATMENT: Rehabilitation  ONSET DATE: Closed displaced comminuted fracture of shaft of left humerus 07/25/21 s/p ORIF on 12/28/21  PERTINENT HISTORY: DM, HTN   PRECAUTIONS: none  SUBJECTIVE:  Pt  reports a few days of no pain following the steroid injection but now she is hurting again with the pain more across her upper arm currently.  PAIN:  Are you having pain? Yes: NPRS scale: 6-7/10 Pain location: L anterolateral shoulder Pain description: "just pain"   OBJECTIVE: (objective measures completed at initial evaluation unless otherwise dated)     AROM      04/08/2022 05/10/22 05/20/22   Left Shoulder Flexion 109 in sitting 130 in supine 131 deg supine 113 deg sitting   108 deg sitting   Left Shoulder ABduction 86 in sitting  122 in supine 125 deg S/L 98 deg sitting    Left Shoulder Internal Rotation 82 Degrees      Left Shoulder External Rotation 56 Degrees        Strength      04/08/2022 04/26/22 05/20/22   Left Shoulder Flexion 3+/5  for available ROM 4-/5    Left Shoulder ABduction 3+/5  for available ROM 4-/5    Left Shoulder Internal Rotation 4+/5  4+/5 4+/5   Left Shoulder External Rotation 4-/5  4-/5 4+/5      TODAY'S TREATMENT:   05/27/22 THERAPEUTIC EXERCISE: to improve flexibility, strength and mobility.  Verbal and tactile cues throughout for technique. Pulleys - flexion and scaption x 3 min each Supine L shoulder flexion with  straight arm and flexed elbow in uppercut pattern x 10 each - pain at end ROM, but better tolerated with with increased ROM when PT providing facilitation for scapular retraction and depression  Supine L shoulder protraction x 10 Supine L shoulder CW/CCW circles at 90 flexion x 10 each direction - VC and TC  for scapular engagement Sidelying L shoulder ER + scap retraction 2 x 10 - initial reps with PT facilitating scapular motion Sidelying L shoulder abduction 0-90+ scap retraction 2 x 10 - initial reps with PT facilitating scapular motion Supine L shoulder CW/CCW circles at 90 abduction/scaption x 10 each direction - VC and TC  for scapular engagement Sidelying L shoulder flexion 0-90 + scap retraction x 10 - initial reps with PT  facilitating scapular motion, pt loosing control into horiz adduction at ~80-90 w/o cues/facilitation for scap retraction  MANUAL THERAPY: To promote normalized muscle tension, improved flexibility, improved joint mobility, increased ROM, and reduced pain. STM/DTM and manual TPR to L upper arm, pectorals, deltoids  SELF CARE: Review of self-STM options using ball on wall for anterior shoulder tightness and painful TPs  MODALITIES: Ionto Patch (#5 of 6) to L anterior shoulder - Dexamethasone 1 mL, 80 mA-min, 4-6 hr wear time   05/20/22 Therapeutic Exercise: UBE - L3.5 x 68mn (367m fwd & back) Supine flex and ER (hand behind head increased pain today) SDLY horizontal ABD x 8 causes increased shoulder pain to 8/10 Seated biceps curls L 4 # 2 x 10, Hammer curls 2 x 10 Cybex row 30# 2x10 Scap squeeze on noodle against wall x 10  Manual Therapy:   IASTM and STM to left upper arm, pectorals, deltoids  Rhythmic stabilization at 90 deg flexion in supine all planes  PNF D2 flex/ext with resistance in supine x 10 ea   PROM limited today by crepitis/pain   05/10/22 Therapeutic Exercise: UBE - L3.5 x 56m37m(3mi39mwd & back) Supine L shoulder flexion with 2# weight 10x S/L L shld abduction 12 reps with green weight ball S/L L shld ER 12 reps with green weight ball   Seated lat pull 25# 10 reps Cybex row 30# 20 reps  Manual Therapy: PROM to L shoulder with prolonged holds each direction STM to L biceps tendon, ant deltoid, pecs Distal scar mobilization  Modalities:  Ionto Patch (#4 of 6) to L anterior shoulder - Dexamethasone 1 mL, 80 mA-min, 4-6 hr wear time    PATIENT EDUCATION: Education details:  HEP clarification - Avoid forcing A/AAROM into painful motions, need to back off on theraband resisted exercises to reduce overuse inflammation - resisted scapular exercise only 1x/day and RTC exercises only 3x/wk Person educated: Patient Education method: Explanation Education  comprehension: verbalized understanding   HOME EXERCISE PROGRAM: Access Code: [email or web address]: [email or web address] Date: 05/27/2022 Prepared by: JoAnAnnie Parasercises - Seated Shoulder Abduction AAROM with Pulley Behind  - 1 x daily - 7 x weekly - 2 sets - 10 reps - 3 sec hold - Seated Shoulder Flexion Towel Slide at Table Top Full Range of Motion  - 1 x daily - 7 x weekly - 2 sets - 10 reps - 3 sec hold - Seated Shoulder Abduction Towel Slide at Table Top  - 1 x daily - 7 x weekly - 2 sets - 10 reps - 3 sec hold - Standing Bilateral Low Shoulder Row with Anchored Resistance  - 1 x daily - 3 x weekly - 2 sets - 10 reps - 5  sec hold - Seated Shoulder Flexion Extension AAROM with Dowel into Wall  - 1 x daily - 7 x weekly - 2 sets - 10 reps - 3 sec hold - Seated Shoulder Circles AAROM with Dowel into Wall  - 1 x daily - 7 x weekly - 2 sets - 10 reps - 3 sec hold - Shoulder Extension with Resistance  - 1 x daily - 3 x weekly - 2 sets - 10 reps - Shoulder external rotation  - 1 x daily - 3 x weekly - 3 sets - 10 reps - 3 sec hold - Shoulder Internal Rotation Reactive Isometrics  - 1 x daily - 3 x weekly - 3 sets - 10 reps - 3 sec hold - Seated Overhead Shoulder External Rotation Stretch with Towel  - 2 x daily - 7 x weekly - 2 sets - 10 reps - 3 sec hold hold - Sidelying Shoulder Abduction Palm Forward  - 1 x daily - 7 x weekly - 2 sets - 10 reps - Supine Shoulder Flexion Extension Full Range AROM  - 1 x daily - 7 x weekly - 2 sets - 10 reps - Gentle Levator Scapulae Stretch  - 2-3 x daily - 7 x weekly - 3 reps - 30 sec hold  Patient Education - TENS Unit - TENS Therapy - Trigger Point Dry Needling  ASSESSMENT:   CLINICAL IMPRESSION:   Wynetta continues to report increased L anterolateral shoulder pain with only short-lived relief from cortisone injection. She persists with a forward/protracted and rounded posture for her L>R shoulder with continued limited scapular  activation/engagement observed with attempts at L shoulder/UE elevation, likely leading to impingement as the source of her pain and limited motion. When scapular motion facilitated by PT, she typically is able to achieve better motion with less pain but very limited carryover with attempts to engage scapular muscles w/o PT facilitation. PT continuing to emphasize focus on posture and scapular engagement/control over extremes of shoulder ROM but [place] seems only focused on lacking shoulder motion and continuing to try to push through the pain despite repeated admonitions to avoid forcing motion into painful ranges. As such, patient is reaching a plateau with progress with PT and will likely be transitioning to her HEP at the end of the current episode next week.    PT Short Term Goals - 03/01/22 0857       PT SHORT TERM GOAL #1   Title Patient will be independent with initial HEP    Status Achieved   01/29/22   Target Date 02/05/22      PT SHORT TERM GOAL #2   Title Improve posture and alignment with patient to demonstrate improved upright posture with posterior shoulder girdle engaged    Status Achieved   03/01/22   Target Date 02/26/22      PT SHORT TERM GOAL #3   Title Patient to improve L shoulder AROM to >/= 90 flexion and scaption/abduction to increase functional L UE use    Status Achieved   02/17/22 - met in supine   Target Date 02/26/22              PT Long Term Goals - 04/08/22 0805       PT LONG TERM GOAL #1   Title Patient will be independent with ongoing/advanced HEP for self-management at home in order to build upon functional gains in therapy    Status On-going    Target Date 06/03/22  PT LONG TERM GOAL #2   Title Decrease pain in the L shoulder/proximal UE by >/= 50% allowing patient to use L UE for functional activities    Status On-going   04/08/22 - pain continues to fluctuate (some relief initially from ionto patches but than pain increasing - suspect overuse  irritation from excessive HEP performance)   Target Date 06/03/22      PT LONG TERM GOAL #3   Title Patient to improve L shoulder AROM to [facility] without pain provocation    Status On-going   05/10/22 - ROM slowly improving but still limited by pain and substitution movement   Target Date 06/03/22      PT LONG TERM GOAL #4   Title Patient will demonstrate improved L shoulder strength to >/= 4/5 for functional UE use    Status On-going   04/08/22 - per strength/MMT chart   Target Date 06/03/22      PT LONG TERM GOAL #5   Title Patient to report ability to perform ADLs, household, and work-related tasks without limitation due to L UE/shoulder pain, LOM or weakness    Status On-going   04/08/22 - pt notes increased ability to reach arm OH into cabinets as well as placing hand on steering wheel in car but still limited control noted   Target Date 06/03/22             PLAN:   Personal Factors and Comorbidities Comorbidity 2;Past/Current Experience;Time since onset of injury/illness/exacerbation;Fitness    Comorbidities DM, HTN    Examination-Activity Limitations Bathing;Bed Mobility;Caring for Others;Carry;Dressing;Hygiene/Grooming;Lift;Reach Overhead;Sleep    Examination-Participation Restrictions Cleaning;Community Activity;Laundry;Meal Prep;Occupation;Shop;Yard Work;Driving    Rehab Potential Good    PT Frequency 1-2x/wk   PT Duration 8 weeks    PT Treatment/Interventions ADLs/Self Care Home Management;Cryotherapy;Electrical Stimulation;Iontophoresis 18m/ml Dexamethasone;Moist Heat;Ultrasound;Therapeutic activities;Therapeutic exercise;Neuromuscular re-education;Patient/family education;Manual techniques;Passive range of motion;Dry needling;Taping;Vasopneumatic Device;Joint Manipulations    PT Next Visit Plan HEP review and consolidation in prep for transition to HEP next visit; rehab per UE ORIF rehab protocol - post-op week #23 as of 05/31/22 (Sx 12/28/21); ionto patch to L anterior shoulder as  indicated and continued benefit noted    PT Home Exercise Plan Access Code: [email or web address]   Consulted and Agree with Plan of Care Patient          JPercival Spanish PT 05/27/2022, 12:31 PM

## 2022-05-28 ENCOUNTER — Encounter: Payer: Medicare Other | Admitting: Physical Therapy

## 2022-05-31 ENCOUNTER — Ambulatory Visit: Payer: Medicare Other | Admitting: Physical Therapy

## 2022-05-31 DIAGNOSIS — M79622 Pain in left upper arm: Secondary | ICD-10-CM

## 2022-05-31 DIAGNOSIS — M25512 Pain in left shoulder: Secondary | ICD-10-CM

## 2022-05-31 DIAGNOSIS — M25612 Stiffness of left shoulder, not elsewhere classified: Secondary | ICD-10-CM

## 2022-05-31 DIAGNOSIS — M6281 Muscle weakness (generalized): Secondary | ICD-10-CM

## 2022-05-31 DIAGNOSIS — R293 Abnormal posture: Secondary | ICD-10-CM

## 2022-05-31 NOTE — Therapy (Addendum)
OUTPATIENT PHYSICAL THERAPY TREATMENT NOTE / DISCHARGE SUMMARY   Patient Name: Caitlin Odonnell MRN: [id number] DOB:1955/11/27, 66 y.o., female Today's Date: 05/31/2022    PT End of Session - 05/31/22 0803     Visit Number 26    Number of Visits 36    Date for PT Re-Evaluation 06/03/22    Authorization Type [employer]    Progress Note Due on Visit 3    PT Start Time 0803    PT Stop Time 0844    PT Time Calculation (min) 41 min    Activity Tolerance Patient tolerated treatment well    Behavior During Therapy WFL for tasks assessed/performed                   Past Medical History:  Diagnosis Date   Arthritis    Complication of anesthesia    as a child vomitted after tonsils removed   Diabetes mellitus without complication ([place])    Hypertension    Neuropathy    Past Surgical History:  Procedure Laterality Date   APPENDECTOMY     CATARACT EXTRACTION W/ INTRAOCULAR LENS IMPLANT     CESAREAN SECTION     INNER EAR SURGERY     ORIF HUMERUS FRACTURE Left 12/28/2021   Procedure: LEFT OPEN REDUCTION INTERNAL FIXATION (ORIF) HUMERAL SHAFT FRACTURE;  Surgeon: Vanetta Mulders, MD;  Location: [place];  Service: Orthopedics;  Laterality: Left;   TONSILLECTOMY     Patient Active Problem List   Diagnosis Date Noted   Humeral shaft fracture 12/28/2021   Closed displaced comminuted fracture of shaft of left humerus    PCP: Dorothy Spark, FNP  REFERRING PROVIDER: Vanetta Mulders, MD  REFERRING DIAG: [phone] (ICD-10-CM) - Closed displaced comminuted fracture of shaft of left humerus  THERAPY DIAG:  No diagnosis found.  RATIONALE FOR EVALUATION AND TREATMENT: Rehabilitation  ONSET DATE: Closed displaced comminuted fracture of shaft of left humerus 07/25/21 s/p ORIF on 12/28/21  PERTINENT HISTORY: DM, HTN   PRECAUTIONS: none  SUBJECTIVE:  Pt reports motions into abduction and behind her back are still the most painful, but minimal pain on arrival to PT  today.  PAIN:  Are you having pain? Yes: NPRS scale:  1/10 Pain location: L anterolateral shoulder Pain description: "just pain"   OBJECTIVE: (objective measures completed at initial evaluation unless otherwise dated)     AROM      04/08/2022 05/10/22 05/20/22   Left Shoulder Flexion 109 in sitting 130 in supine 131 deg supine 113 deg sitting   108 deg sitting   Left Shoulder ABduction 86 in sitting  122 in supine 125 deg S/L 98 deg sitting    Left Shoulder Internal Rotation 82 Degrees      Left Shoulder External Rotation 56 Degrees        Strength      04/08/2022 04/26/22 05/20/22   Left Shoulder Flexion 3+/5  for available ROM 4-/5    Left Shoulder ABduction 3+/5  for available ROM 4-/5    Left Shoulder Internal Rotation 4+/5  4+/5 4+/5   Left Shoulder External Rotation 4-/5  4-/5 4+/5      TODAY'S TREATMENT:   05/31/22 THERAPEUTIC EXERCISE: to improve flexibility, strength and mobility.  Verbal and tactile cues throughout for technique. Pulleys - flexion and scaption x 3 min each L standing row/extension GTB x 10 each L isometric step out with GTB - ER/IR x 10 each BATCA lat pull 20# 2x10 Standing bicep curls 3# x  10 - c/o pain in Lshoulder  Standing ER stretch with towel behind back review  05/27/22 THERAPEUTIC EXERCISE: to improve flexibility, strength and mobility.  Verbal and tactile cues throughout for technique. Pulleys - flexion and scaption x 3 min each Supine L shoulder flexion with straight arm and flexed elbow in uppercut pattern x 10 each - pain at end ROM, but better tolerated with with increased ROM when PT providing facilitation for scapular retraction and depression  Supine L shoulder protraction x 10 Supine L shoulder CW/CCW circles at 90 flexion x 10 each direction - VC and TC  for scapular engagement Sidelying L shoulder ER + scap retraction 2 x 10 - initial reps with PT facilitating scapular motion Sidelying L shoulder abduction 0-90+ scap  retraction 2 x 10 - initial reps with PT facilitating scapular motion Supine L shoulder CW/CCW circles at 90 abduction/scaption x 10 each direction - VC and TC  for scapular engagement Sidelying L shoulder flexion 0-90 + scap retraction x 10 - initial reps with PT facilitating scapular motion, pt loosing control into horiz adduction at ~80-90 w/o cues/facilitation for scap retraction  MANUAL THERAPY: To promote normalized muscle tension, improved flexibility, improved joint mobility, increased ROM, and reduced pain. STM/DTM and manual TPR to L upper arm, pectorals, deltoids  SELF CARE: Review of self-STM options using ball on wall for anterior shoulder tightness and painful TPs  MODALITIES: Ionto Patch (#5 of 6) to L anterior shoulder - Dexamethasone 1 mL, 80 mA-min, 4-6 hr wear time   05/20/22 Therapeutic Exercise: UBE - L3.5 x 140mn (398m fwd & back) Supine flex and ER (hand behind head increased pain today) SDLY horizontal ABD x 8 causes increased shoulder pain to 8/10 Seated biceps curls L 4 # 2 x 10, Hammer curls 2 x 10 Cybex row 30# 2x10 Scap squeeze on noodle against wall x 10  Manual Therapy:   IASTM and STM to left upper arm, pectorals, deltoids  Rhythmic stabilization at 90 deg flexion in supine all planes  PNF D2 flex/ext with resistance in supine x 10 ea   PROM limited today by crepitis/pain   05/10/22 Therapeutic Exercise: UBE - L3.5 x 40m40m(3mi7mwd & back) Supine L shoulder flexion with 2# weight 10x S/L L shld abduction 12 reps with green weight ball S/L L shld ER 12 reps with green weight ball   Seated lat pull 25# 10 reps Cybex row 30# 20 reps  Manual Therapy: PROM to L shoulder with prolonged holds each direction STM to L biceps tendon, ant deltoid, pecs Distal scar mobilization  Modalities:  Ionto Patch (#4 of 6) to L anterior shoulder - Dexamethasone 1 mL, 80 mA-min, 4-6 hr wear time    PATIENT EDUCATION: Education details:  HEP clarification -  Avoid forcing A/AAROM into painful motions, need to back off on theraband resisted exercises to reduce overuse inflammation - resisted scapular exercise only 1x/day and RTC exercises only 3x/wk Person educated: Patient Education method: Explanation Education comprehension: verbalized understanding   HOME EXERCISE PROGRAM: Access Code: [email or web address]: [email or web address] Date: 05/27/2022 Prepared by: JoAnAnnie Parasercises - Seated Shoulder Abduction AAROM with Pulley Behind  - 1 x daily - 7 x weekly - 2 sets - 10 reps - 3 sec hold - Seated Shoulder Flexion Towel Slide at Table Top Full Range of Motion  - 1 x daily - 7 x weekly - 2 sets - 10 reps - 3 sec hold - Seated Shoulder Abduction Towel Slide at  Table Top  - 1 x daily - 7 x weekly - 2 sets - 10 reps - 3 sec hold - Standing Bilateral Low Shoulder Row with Anchored Resistance  - 1 x daily - 3 x weekly - 2 sets - 10 reps - 5 sec hold - Seated Shoulder Flexion Extension AAROM with Dowel into Wall  - 1 x daily - 7 x weekly - 2 sets - 10 reps - 3 sec hold - Seated Shoulder Circles AAROM with Dowel into Wall  - 1 x daily - 7 x weekly - 2 sets - 10 reps - 3 sec hold - Shoulder Extension with Resistance  - 1 x daily - 3 x weekly - 2 sets - 10 reps - Shoulder external rotation  - 1 x daily - 3 x weekly - 3 sets - 10 reps - 3 sec hold - Shoulder Internal Rotation Reactive Isometrics  - 1 x daily - 3 x weekly - 3 sets - 10 reps - 3 sec hold - Seated Overhead Shoulder External Rotation Stretch with Towel  - 2 x daily - 7 x weekly - 2 sets - 10 reps - 3 sec hold hold - Sidelying Shoulder Abduction Palm Forward  - 1 x daily - 7 x weekly - 2 sets - 10 reps - Supine Shoulder Flexion Extension Full Range AROM  - 1 x daily - 7 x weekly - 2 sets - 10 reps - Gentle Levator Scapulae Stretch  - 2-3 x daily - 7 x weekly - 3 reps - 30 sec hold  Patient Education - TENS Unit - TENS Therapy - Trigger Point Dry  Needling  ASSESSMENT:   CLINICAL IMPRESSION:   Reviewed HEP to ensure understanding and promote good carryover with PT. Pt still wanting to push to painful ROM, despite instructions given to avoid it. Focused on strengthening posterior cuff and scap stabilizers to promote proper alignment of shoulders. Pt was offered modalities but denied. We are still planning on wrapping up with PT next visit.     PT Short Term Goals - 03/01/22 0857       PT SHORT TERM GOAL #1   Title Patient will be independent with initial HEP    Status Achieved   01/29/22   Target Date 02/05/22      PT SHORT TERM GOAL #2   Title Improve posture and alignment with patient to demonstrate improved upright posture with posterior shoulder girdle engaged    Status Achieved   03/01/22   Target Date 02/26/22      PT SHORT TERM GOAL #3   Title Patient to improve L shoulder AROM to >/= 90 flexion and scaption/abduction to increase functional L UE use    Status Achieved   02/17/22 - met in supine   Target Date 02/26/22              PT Long Term Goals - 04/08/22 0805       PT LONG TERM GOAL #1   Title Patient will be independent with ongoing/advanced HEP for self-management at home in order to build upon functional gains in therapy    Status On-going    Target Date 06/03/22      PT LONG TERM GOAL #2   Title Decrease pain in the L shoulder/proximal UE by >/= 50% allowing patient to use L UE for functional activities    Status On-going   04/08/22 - pain continues to fluctuate (some relief initially from ionto patches but than  pain increasing - suspect overuse irritation from excessive HEP performance)   Target Date 06/03/22      PT LONG TERM GOAL #3   Title Patient to improve L shoulder AROM to [facility] without pain provocation    Status On-going   05/10/22 - ROM slowly improving but still limited by pain and substitution movement   Target Date 06/03/22      PT LONG TERM GOAL #4   Title Patient will demonstrate  improved L shoulder strength to >/= 4/5 for functional UE use    Status On-going   04/08/22 - per strength/MMT chart   Target Date 06/03/22      PT LONG TERM GOAL #5   Title Patient to report ability to perform ADLs, household, and work-related tasks without limitation due to L UE/shoulder pain, LOM or weakness    Status On-going   04/08/22 - pt notes increased ability to reach arm OH into cabinets as well as placing hand on steering wheel in car but still limited control noted   Target Date 06/03/22             PLAN:   Personal Factors and Comorbidities Comorbidity 2;Past/Current Experience;Time since onset of injury/illness/exacerbation;Fitness    Comorbidities DM, HTN    Examination-Activity Limitations Bathing;Bed Mobility;Caring for Others;Carry;Dressing;Hygiene/Grooming;Lift;Reach Overhead;Sleep    Examination-Participation Restrictions Cleaning;Community Activity;Laundry;Meal Prep;Occupation;Shop;Yard Work;Driving    Rehab Potential Good    PT Frequency 1-2x/wk   PT Duration 8 weeks    PT Treatment/Interventions ADLs/Self Care Home Management;Cryotherapy;Electrical Stimulation;Iontophoresis 77m/ml Dexamethasone;Moist Heat;Ultrasound;Therapeutic activities;Therapeutic exercise;Neuromuscular re-education;Patient/family education;Manual techniques;Passive range of motion;Dry needling;Taping;Vasopneumatic Device;Joint Manipulations    PT Next Visit Plan HEP review and consolidation in prep for transition to HEP next visit; rehab per UE ORIF rehab protocol - post-op week #23 as of 05/31/22 (Sx 12/28/21); ionto patch to L anterior shoulder as indicated and continued benefit noted    PT Home Exercise Plan Access Code: [email or web address]   Consulted and Agree with Plan of Care Patient          BArtist Pais PTA 05/31/2022, 8:45 AM     PHYSICAL THERAPY DISCHARGE SUMMARY  Visits from Start of Care: 26  Current functional level related to goals / functional outcomes:   Refer to above  clinical impression and goal assessment for status as of last visit on 05/31/22. Patient cancelled her final scheduled visit stating she felt that she had adequate exercises to work on at home, thus unable to complete formal discharge assessment. She has not returned to PT in 30 days, therefore will proceed with discharge from PT for this episode.     Remaining deficits:   Unable to formally assess status at discharge due to pt cancelling final visit and not returning to PT, but as of last visit pt still experiencing pain and limited L shoulder ROM. She continued to try to push through pain despite frequent repeated cues/instruction to avoid this.   Education / Equipment:   HEP   Patient agrees to discharge. Patient goals were partially met. Patient is being discharged due to the patient's request.  JPercival Spanish PT, MPT 06/29/22, 1:47 PM  [facility] [place] [place] [address]Phone: [phone]  Fax:  [phone]

## 2022-06-03 ENCOUNTER — Encounter: Payer: Medicare Other | Admitting: Physical Therapy

## 2022-06-30 ENCOUNTER — Ambulatory Visit (INDEPENDENT_AMBULATORY_CARE_PROVIDER_SITE_OTHER): Payer: Medicare Other | Admitting: Orthopaedic Surgery

## 2022-06-30 ENCOUNTER — Ambulatory Visit (INDEPENDENT_AMBULATORY_CARE_PROVIDER_SITE_OTHER): Payer: Medicare Other

## 2022-06-30 DIAGNOSIS — S42352K Displaced comminuted fracture of shaft of humerus, left arm, subsequent encounter for fracture with nonunion: Secondary | ICD-10-CM | POA: Diagnosis not present

## 2022-06-30 NOTE — Progress Notes (Signed)
Post Operative Evaluation    Procedure/Date of Surgery: Left humerus open reduction internal fixation 12/29/21  Interval History:   Presents today for follow-up of her left humerus.  She states the last 2 weeks this has become more tight and painful.  This is different than the usual achiness.  PMH/PSH/Family History/Social History/Meds/Allergies:    Past Medical History:  Diagnosis Date   Arthritis    Complication of anesthesia    as a child vomitted after tonsils removed   Diabetes mellitus without complication (HCC)    Hypertension    Neuropathy    Past Surgical History:  Procedure Laterality Date   APPENDECTOMY     CATARACT EXTRACTION W/ INTRAOCULAR LENS IMPLANT     CESAREAN SECTION     INNER EAR SURGERY     ORIF HUMERUS FRACTURE Left 12/28/2021   Procedure: LEFT OPEN REDUCTION INTERNAL FIXATION (ORIF) HUMERAL SHAFT FRACTURE;  Surgeon: Huel Cote, MD;  Location: [facility];  Service: Orthopedics;  Laterality: Left;   TONSILLECTOMY     Social History   Socioeconomic History   Marital status: Widowed    Spouse name: Not on file   Number of children: Not on file   Years of education: Not on file   Highest education level: Not on file  Occupational History   Not on file  Tobacco Use   Smoking status: Never   Smokeless tobacco: Never  Substance and Sexual Activity   Alcohol use: Never   Drug use: Never   Sexual activity: Not on file  Other Topics Concern   Not on file  Social History Narrative   Not on file   Social Determinants of Health   Financial Resource Strain: Not on file  Food Insecurity: Not on file  Transportation Needs: Not on file  Physical Activity: Not on file  Stress: Not on file  Social Connections: Not on file   No family history on file. Allergies  Allergen Reactions   Cortisone Other (See Comments)    Had some numbness in her face. States it happened after a cortisone injection in her knee, had never  had reaction to it prior to this incident.    Current Outpatient Medications  Medication Sig Dispense Refill   amLODipine (NORVASC) 5 MG tablet Take 5 mg by mouth at bedtime.     aspirin EC 325 MG tablet Take 1 tablet (325 mg total) by mouth daily. 30 tablet 0   hydrOXYzine (ATARAX) 25 MG tablet Take 25 mg by mouth at bedtime.     ibuprofen (ADVIL) 200 MG tablet Take 400-600 mg by mouth every 8 (eight) hours as needed for moderate pain.     lisinopril-hydrochlorothiazide (ZESTORETIC) 20-25 MG tablet Take 1 tablet by mouth daily.     metFORMIN (GLUCOPHAGE) 1000 MG tablet Take 1,000 mg by mouth daily with breakfast.     MOUNJARO 2.5 MG/0.5ML Pen Inject 2.5 mg into the skin every Tuesday.     oxyCODONE (OXY IR/ROXICODONE) 5 MG immediate release tablet Take 1 tablet (5 mg total) by mouth every 4 (four) hours as needed (severe pain). 20 tablet 0   pregabalin (LYRICA) 50 MG capsule Take 50 mg by mouth at bedtime. Pt may take a 2nd dose throughout the day if needed     simvastatin (ZOCOR) 5 MG tablet Take 5  mg by mouth at bedtime.     No current facility-administered medications for this visit.   No results found.  Review of Systems:   A ROS was performed including pertinent positives and negatives as documented in the HPI.   Musculoskeletal Exam:    There were no vitals taken for this visit.  Left incision is well-healed.  She able flex and extend at the left elbow.  She able to fire EPL as well as the wrist extensors.  2+ radial pulse.  Fires interosseous muscles.  Active forward elevation is to 120 degrees.  External rotation at the side is to 45 degrees  Imaging:    Left humerus 2 view x-ray: Status post open reduction internal fixation without evidence of hardware failure increasing callus formation about the fracture site although this is very slow  I personally reviewed and interpreted the radiographs.   Assessment:   66 year old female status post left humerus open reduction  internal fixation now 6 months out.  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  To that effect I do believe that it is imperative to provide her with a bone stimulator so that we can encourage fracture site healing.  We will plan to perform this in hopes of avoiding any additional repeat surgery.  Plan :    -Plan for bone stimulator in the left arm -She will return to clinic in 2 months for close follow-up so that we can visualize the effect of the bone stimulator   I personally saw and evaluated the patient, and participated in the management and treatment plan.  Huel Cote, MD Attending Physician, Orthopedic Surgery  This document was dictated using Dragon voice recognition software. A reasonable attempt at proof reading has been made to minimize errors.  PT

## 2022-07-13 ENCOUNTER — Telehealth: Payer: Self-pay

## 2022-07-13 NOTE — Telephone Encounter (Signed)
LMTCB. Left number for [facility] for pt to call and schedule since Im not at [facility].

## 2022-07-13 NOTE — Telephone Encounter (Signed)
Patient would like a call back concerning machine for her left arm.  Cb# [phone].  Please advise.  Thank you.

## 2022-07-16 ENCOUNTER — Ambulatory Visit (INDEPENDENT_AMBULATORY_CARE_PROVIDER_SITE_OTHER): Payer: Medicare Other | Admitting: Orthopaedic Surgery

## 2022-07-16 ENCOUNTER — Ambulatory Visit (INDEPENDENT_AMBULATORY_CARE_PROVIDER_SITE_OTHER): Payer: Medicare Other

## 2022-07-16 DIAGNOSIS — S42352K Displaced comminuted fracture of shaft of humerus, left arm, subsequent encounter for fracture with nonunion: Secondary | ICD-10-CM | POA: Diagnosis not present

## 2022-07-16 NOTE — Progress Notes (Signed)
Post Operative Evaluation    Procedure/Date of Surgery: Left humerus open reduction internal fixation 12/29/21  Interval History:   Presents today for placement of exigent bone stimulator  PMH/PSH/Family History/Social History/Meds/Allergies:    Past Medical History:  Diagnosis Date   Arthritis    Complication of anesthesia    as a child vomitted after tonsils removed   Diabetes mellitus without complication (HCC)    Hypertension    Neuropathy    Past Surgical History:  Procedure Laterality Date   APPENDECTOMY     CATARACT EXTRACTION W/ INTRAOCULAR LENS IMPLANT     CESAREAN SECTION     INNER EAR SURGERY     ORIF HUMERUS FRACTURE Left 12/28/2021   Procedure: LEFT OPEN REDUCTION INTERNAL FIXATION (ORIF) HUMERAL SHAFT FRACTURE;  Surgeon: Huel Cote, MD;  Location: [facility];  Service: Orthopedics;  Laterality: Left;   TONSILLECTOMY     Social History   Socioeconomic History   Marital status: Widowed    Spouse name: Not on file   Number of children: Not on file   Years of education: Not on file   Highest education level: Not on file  Occupational History   Not on file  Tobacco Use   Smoking status: Never   Smokeless tobacco: Never  Substance and Sexual Activity   Alcohol use: Never   Drug use: Never   Sexual activity: Not on file  Other Topics Concern   Not on file  Social History Narrative   Not on file   Social Determinants of Health   Financial Resource Strain: Not on file  Food Insecurity: Not on file  Transportation Needs: Not on file  Physical Activity: Not on file  Stress: Not on file  Social Connections: Not on file   No family history on file. Allergies  Allergen Reactions   Cortisone Other (See Comments)    Had some numbness in her face. States it happened after a cortisone injection in her knee, had never had reaction to it prior to this incident.    Current Outpatient Medications  Medication Sig Dispense  Refill   amLODipine (NORVASC) 5 MG tablet Take 5 mg by mouth at bedtime.     aspirin EC 325 MG tablet Take 1 tablet (325 mg total) by mouth daily. 30 tablet 0   hydrOXYzine (ATARAX) 25 MG tablet Take 25 mg by mouth at bedtime.     ibuprofen (ADVIL) 200 MG tablet Take 400-600 mg by mouth every 8 (eight) hours as needed for moderate pain.     lisinopril-hydrochlorothiazide (ZESTORETIC) 20-25 MG tablet Take 1 tablet by mouth daily.     metFORMIN (GLUCOPHAGE) 1000 MG tablet Take 1,000 mg by mouth daily with breakfast.     MOUNJARO 2.5 MG/0.5ML Pen Inject 2.5 mg into the skin every Tuesday.     oxyCODONE (OXY IR/ROXICODONE) 5 MG immediate release tablet Take 1 tablet (5 mg total) by mouth every 4 (four) hours as needed (severe pain). 20 tablet 0   pregabalin (LYRICA) 50 MG capsule Take 50 mg by mouth at bedtime. Pt may take a 2nd dose throughout the day if needed     simvastatin (ZOCOR) 5 MG tablet Take 5 mg by mouth at bedtime.     No current facility-administered medications for this visit.   No results found.  Review of Systems:   A ROS was performed including pertinent positives and negatives as documented in the HPI.   Musculoskeletal Exam:    There were no vitals taken for this visit.  Left incision is well-healed.  She able flex and extend at the left elbow.  She able to fire EPL as well as the wrist extensors.  2+ radial pulse.  Fires interosseous muscles.  Active forward elevation is to 120 degrees.  External rotation at the side is to 45 degrees  Imaging:    Left humerus 2 view x-ray: Status post open reduction internal fixation without evidence of hardware failure increasing callus formation about the fracture site although this is very slow.  Exigent bone stimulator placed today  I personally reviewed and interpreted the radiographs.   Assessment:   66 year old female status post left humerus open reduction internal fixation now 6 months out.  She is here today for placement  of exigent bone stimulator  Plan :    -Plan for bone stimulator in the left arm    I personally saw and evaluated the patient, and participated in the management and treatment plan.  Huel Cote, MD Attending Physician, Orthopedic Surgery  This document was dictated using Dragon voice recognition software. A reasonable attempt at proof reading has been made to minimize errors.  PT

## 2022-08-25 ENCOUNTER — Ambulatory Visit (INDEPENDENT_AMBULATORY_CARE_PROVIDER_SITE_OTHER): Payer: Medicare Other

## 2022-08-25 ENCOUNTER — Ambulatory Visit (INDEPENDENT_AMBULATORY_CARE_PROVIDER_SITE_OTHER): Payer: Medicare Other | Admitting: Orthopaedic Surgery

## 2022-08-25 ENCOUNTER — Other Ambulatory Visit (HOSPITAL_BASED_OUTPATIENT_CLINIC_OR_DEPARTMENT_OTHER): Payer: Self-pay

## 2022-08-25 DIAGNOSIS — S42352A Displaced comminuted fracture of shaft of humerus, left arm, initial encounter for closed fracture: Secondary | ICD-10-CM | POA: Diagnosis not present

## 2022-08-25 MED ORDER — TRAMADOL HCL 50 MG PO TABS
50.0000 mg | ORAL_TABLET | Freq: Four times a day (QID) | ORAL | 4 refills | Status: DC | PRN
  Filled 2022-08-25: qty 30, 7d supply, fill #0

## 2022-08-25 NOTE — Progress Notes (Signed)
Post Operative Evaluation    Procedure/Date of Surgery: Left humerus open reduction internal fixation 12/29/21  Interval History:   Presents today for follow-up of her known left humeral fracture.  She is now 8 months out.  She continues to complain of soreness around the fracture site.  PMH/PSH/Family History/Social History/Meds/Allergies:    Past Medical History:  Diagnosis Date   Arthritis    Complication of anesthesia    as a child vomitted after tonsils removed   Diabetes mellitus without complication ([place])    Hypertension    Neuropathy    Past Surgical History:  Procedure Laterality Date   APPENDECTOMY     CATARACT EXTRACTION W/ INTRAOCULAR LENS IMPLANT     CESAREAN SECTION     INNER EAR SURGERY     ORIF HUMERUS FRACTURE Left 12/28/2021   Procedure: LEFT OPEN REDUCTION INTERNAL FIXATION (ORIF) HUMERAL SHAFT FRACTURE;  Surgeon: Vanetta Mulders, MD;  Location: [facility];  Service: Orthopedics;  Laterality: Left;   TONSILLECTOMY     Social History   Socioeconomic History   Marital status: Widowed    Spouse name: Not on file   Number of children: Not on file   Years of education: Not on file   Highest education level: Not on file  Occupational History   Not on file  Tobacco Use   Smoking status: Never   Smokeless tobacco: Never  Substance and Sexual Activity   Alcohol use: Never   Drug use: Never   Sexual activity: Not on file  Other Topics Concern   Not on file  Social History Narrative   Not on file   Social Determinants of Health   Financial Resource Strain: Not on file  Food Insecurity: Not on file  Transportation Needs: Not on file  Physical Activity: Not on file  Stress: Not on file  Social Connections: Not on file   No family history on file. Allergies  Allergen Reactions   Cortisone Other (See Comments)    Had some numbness in her face. States it happened after a cortisone injection in her knee, had never had  reaction to it prior to this incident.    Current Outpatient Medications  Medication Sig Dispense Refill   amLODipine (NORVASC) 5 MG tablet Take 5 mg by mouth at bedtime.     aspirin EC 325 MG tablet Take 1 tablet (325 mg total) by mouth daily. 30 tablet 0   hydrOXYzine (ATARAX) 25 MG tablet Take 25 mg by mouth at bedtime.     ibuprofen (ADVIL) 200 MG tablet Take 400-600 mg by mouth every 8 (eight) hours as needed for moderate pain.     lisinopril-hydrochlorothiazide (ZESTORETIC) 20-25 MG tablet Take 1 tablet by mouth daily.     metFORMIN (GLUCOPHAGE) 1000 MG tablet Take 1,000 mg by mouth daily with breakfast.     MOUNJARO 2.5 MG/0.5ML Pen Inject 2.5 mg into the skin every Tuesday.     oxyCODONE (OXY IR/ROXICODONE) 5 MG immediate release tablet Take 1 tablet (5 mg total) by mouth every 4 (four) hours as needed (severe pain). 20 tablet 0   pregabalin (LYRICA) 50 MG capsule Take 50 mg by mouth at bedtime. Pt may take a 2nd dose throughout the day if needed     simvastatin (ZOCOR) 5 MG tablet Take 5 mg by  mouth at bedtime.     No current facility-administered medications for this visit.   No results found.  Review of Systems:   A ROS was performed including pertinent positives and negatives as documented in the HPI.   Musculoskeletal Exam:    There were no vitals taken for this visit.  Left incision is well-healed.  She able flex and extend at the left elbow.  She able to fire EPL as well as the wrist extensors.  2+ radial pulse.  Fires interosseous muscles.  Active forward elevation is to 120 degrees.  External rotation at the side is to 45 degrees  Imaging:    Left humerus 2 view x-ray: Status post open reduction internal fixation without evidence of hardware failure increasing callus formation about the fracture site although this is very slow.    I personally reviewed and interpreted the radiographs.   Assessment:   66 year old female status post left humerus open reduction  internal fixation now 8 months out.  She is overall developing callus very slowly but in the continued fashion.  I have advised that she continue to use her bone stimulator daily.  I will plan to send her a prescription for tramadol to help with her pain.  I like to see her back in 3 months for recheck  Plan :    -Plan for bone stimulator for an additional 3 months and recheck.    I personally saw and evaluated the patient, and participated in the management and treatment plan.  Huel Cote, MD Attending Physician, Orthopedic Surgery  This document was dictated using Dragon voice recognition software. A reasonable attempt at proof reading has been made to minimize errors.  PT

## 2022-08-26 ENCOUNTER — Telehealth: Payer: Self-pay | Admitting: Orthopaedic Surgery

## 2022-08-26 ENCOUNTER — Other Ambulatory Visit (HOSPITAL_BASED_OUTPATIENT_CLINIC_OR_DEPARTMENT_OTHER): Payer: Self-pay | Admitting: Orthopaedic Surgery

## 2022-08-26 MED ORDER — TRAMADOL HCL 50 MG PO TABS: 50.0000 mg | ORAL_TABLET | Freq: Four times a day (QID) | ORAL | 4 refills | Status: DC | PRN

## 2022-08-26 NOTE — Telephone Encounter (Signed)
Pt called requesting her medication be sent to [facility] in [address]. Please call pt at [phone].

## 2022-11-17 ENCOUNTER — Ambulatory Visit (INDEPENDENT_AMBULATORY_CARE_PROVIDER_SITE_OTHER): Payer: Medicare Other | Admitting: Orthopaedic Surgery

## 2022-11-17 ENCOUNTER — Ambulatory Visit (INDEPENDENT_AMBULATORY_CARE_PROVIDER_SITE_OTHER): Payer: Medicare Other

## 2022-11-17 DIAGNOSIS — S42352A Displaced comminuted fracture of shaft of humerus, left arm, initial encounter for closed fracture: Secondary | ICD-10-CM

## 2022-11-17 NOTE — Progress Notes (Signed)
Post Operative Evaluation    Procedure/Date of Surgery: Left humerus open reduction internal fixation 12/29/21  Interval History:   Presents today for follow-up of her left humerus open reduction internal fixation.  Unfortunately she still has pain at the fracture site.  Denies any fevers or chills or swelling of the incision.  She has been using her bone stimulator daily.  At this time she continues to be frustrated with lack of progress.  She has been able to use the arm for daily function although this is with pain.  PMH/PSH/Family History/Social History/Meds/Allergies:    Past Medical History:  Diagnosis Date   Arthritis    Complication of anesthesia    as a child vomitted after tonsils removed   Diabetes mellitus without complication ([place])    Hypertension    Neuropathy    Past Surgical History:  Procedure Laterality Date   APPENDECTOMY     CATARACT EXTRACTION W/ INTRAOCULAR LENS IMPLANT     CESAREAN SECTION     INNER EAR SURGERY     ORIF HUMERUS FRACTURE Left 12/28/2021   Procedure: LEFT OPEN REDUCTION INTERNAL FIXATION (ORIF) HUMERAL SHAFT FRACTURE;  Surgeon: Vanetta Mulders, MD;  Location: [facility];  Service: Orthopedics;  Laterality: Left;   TONSILLECTOMY     Social History   Socioeconomic History   Marital status: Widowed    Spouse name: Not on file   Number of children: Not on file   Years of education: Not on file   Highest education level: Not on file  Occupational History   Not on file  Tobacco Use   Smoking status: Never   Smokeless tobacco: Never  Substance and Sexual Activity   Alcohol use: Never   Drug use: Never   Sexual activity: Not on file  Other Topics Concern   Not on file  Social History Narrative   Not on file   Social Determinants of Health   Financial Resource Strain: Not on file  Food Insecurity: Not on file  Transportation Needs: Not on file  Physical Activity: Not on file  Stress: Not on file   Social Connections: Not on file   No family history on file. Allergies  Allergen Reactions   Cortisone Other (See Comments)    Had some numbness in her face. States it happened after a cortisone injection in her knee, had never had reaction to it prior to this incident.    Current Outpatient Medications  Medication Sig Dispense Refill   amLODipine (NORVASC) 5 MG tablet Take 5 mg by mouth at bedtime.     aspirin EC 325 MG tablet Take 1 tablet (325 mg total) by mouth daily. 30 tablet 0   hydrOXYzine (ATARAX) 25 MG tablet Take 25 mg by mouth at bedtime.     ibuprofen (ADVIL) 200 MG tablet Take 400-600 mg by mouth every 8 (eight) hours as needed for moderate pain.     lisinopril-hydrochlorothiazide (ZESTORETIC) 20-25 MG tablet Take 1 tablet by mouth daily.     metFORMIN (GLUCOPHAGE) 1000 MG tablet Take 1,000 mg by mouth daily with breakfast.     MOUNJARO 2.5 MG/0.5ML Pen Inject 2.5 mg into the skin every Tuesday.     oxyCODONE (OXY IR/ROXICODONE) 5 MG immediate release tablet Take 1 tablet (5 mg total) by mouth every 4 (four) hours as  needed (severe pain). 20 tablet 0   pregabalin (LYRICA) 50 MG capsule Take 50 mg by mouth at bedtime. Pt may take a 2nd dose throughout the day if needed     simvastatin (ZOCOR) 5 MG tablet Take 5 mg by mouth at bedtime.     traMADol (ULTRAM) 50 MG tablet Take 1 tablet (50 mg total) by mouth every 6 (six) hours as needed. 30 tablet 4   No current facility-administered medications for this visit.   No results found.  Review of Systems:   A ROS was performed including pertinent positives and negatives as documented in the HPI.   Musculoskeletal Exam:    There were no vitals taken for this visit.  Left incision is well-healed.  She able flex and extend at the left elbow.  She able to fire EPL as well as the wrist extensors.  2+ radial pulse.  Fires interosseous muscles.  Active forward elevation is to 120 degrees.  External rotation at the side is to 45  degrees  Imaging:    Left humerus 2 view x-ray: Status post open reduction internal fixation without evidence of hardware failure increasing callus formation about the fracture site although this is very slow.    I personally reviewed and interpreted the radiographs.   Assessment:   67 year old female status post left humerus open reduction internal fixation now 11 months out.  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  At this time I do believe ESR and CRP would be a good idea to rule out infection.  I would also like to plan to refer her to Dr. Doreatha Martin for second opinion as I do believe she may ultimately need a revision open reduction internal fixation as she is not having any interval callus formation despite usage of the bone stimulator.  I would like his opinion on this.  Plan :    -Plan for referral to Dr. 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  Vanetta Mulders, MD Attending Physician, Orthopedic Surgery  This document was dictated using Dragon voice recognition software. A reasonable attempt at proof reading has been made to minimize errors.  PT

## 2022-11-25 ENCOUNTER — Ambulatory Visit (HOSPITAL_BASED_OUTPATIENT_CLINIC_OR_DEPARTMENT_OTHER): Payer: Medicare Other | Admitting: Orthopaedic Surgery

## 2022-12-01 ENCOUNTER — Telehealth: Payer: Self-pay | Admitting: Orthopaedic Surgery

## 2022-12-01 NOTE — Telephone Encounter (Signed)
Pt called requesting a call back from Dr Sammuel Hines about her blood work results. Please call pt at [phone]

## 2022-12-01 NOTE — Telephone Encounter (Signed)
RC to patient and informed her of Dr. Eddie Dibbles message. No further questions

## 2022-12-20 ENCOUNTER — Other Ambulatory Visit (HOSPITAL_BASED_OUTPATIENT_CLINIC_OR_DEPARTMENT_OTHER): Payer: Self-pay | Admitting: Student

## 2022-12-20 DIAGNOSIS — S42302K Unspecified fracture of shaft of humerus, left arm, subsequent encounter for fracture with nonunion: Secondary | ICD-10-CM

## 2022-12-22 ENCOUNTER — Ambulatory Visit (HOSPITAL_BASED_OUTPATIENT_CLINIC_OR_DEPARTMENT_OTHER)
Admission: RE | Admit: 2022-12-22 | Discharge: 2022-12-22 | Disposition: A | Discharge status: Home / Self Care | Payer: Medicare Other | Source: Ambulatory Visit | Attending: Student | Admitting: Student

## 2022-12-22 DIAGNOSIS — S42302K Unspecified fracture of shaft of humerus, left arm, subsequent encounter for fracture with nonunion: Secondary | ICD-10-CM | POA: Insufficient documentation

## 2022-12-31 ENCOUNTER — Other Ambulatory Visit (HOSPITAL_BASED_OUTPATIENT_CLINIC_OR_DEPARTMENT_OTHER): Payer: Self-pay | Admitting: Orthopaedic Surgery

## 2023-01-04 ENCOUNTER — Ambulatory Visit: Payer: Self-pay | Admitting: Student

## 2023-01-04 DIAGNOSIS — S42352A Displaced comminuted fracture of shaft of humerus, left arm, initial encounter for closed fracture: Secondary | ICD-10-CM

## 2023-01-12 ENCOUNTER — Encounter (HOSPITAL_COMMUNITY): Payer: Self-pay | Admitting: Student

## 2023-01-12 ENCOUNTER — Other Ambulatory Visit: Payer: Self-pay

## 2023-01-12 NOTE — Progress Notes (Signed)
PCP - Lonia Blood, FNP Cardiologist - denies  PPM/ICD - denies  EKG - the day of surgery  CPAP - n/a  Fasting Blood Sugar - patient is not checking CBG at home; patient does not have a CBG machine at home  Blood Thinner Instructions: n/a Patient was instructed: As of today, STOP taking any Aspirin (unless otherwise instructed by your surgeon) Aleve, Naproxen, Ibuprofen, Motrin, Advil, Goody's, BC's, all herbal medications, fish oil, and all vitamins.  ERAS Protcol - n/a  COVID TEST- n/a  Anesthesia review: no  Patient verbally denies any shortness of breath, fever, cough and chest pain during phone call   -------------  SDW INSTRUCTIONS given:  Your procedure is scheduled on Friday, March 15th, 2024.  Report to [facility] Main Entrance "A" at 05:30 A.M., and check in at the Admitting office.  Call this number if you have problems the morning of surgery:  [phone]   Remember:  Do not eat or drink after midnight the night before your surgery    Take these medicines the morning of surgery with A SIP OF WATER: Amlodipine PRN: Tramadol Hold Metformin the day of surgery   The day of surgery:                     Do not wear jewelry, make up, or nail polish            Do not wear lotions, powders, perfumes, or deodorant.            Men may shave face and neck.            Do not bring valuables to the hospital.            [facility] is not responsible for any belongings or valuables.  Do NOT Smoke (Tobacco/Vaping) 24 hours prior to your procedure If you use a CPAP at night, you may bring all equipment for your overnight stay.   Contacts, glasses, dentures or bridgework may not be worn into surgery.      For patients admitted to the hospital, discharge time will be determined by your treatment team.   Patients discharged the day of surgery will not be allowed to drive home, and someone needs to stay with them for 24 hours.    Special instructions:   [facility]- Preparing For Surgery  Before surgery, you can play an important role. Because skin is not sterile, your skin needs to be as free of germs as possible. You can reduce the number of germs on your skin by washing with CHG (chlorahexidine gluconate) Soap before surgery.  CHG is an antiseptic cleaner which kills germs and bonds with the skin to continue killing germs even after washing.    Oral Hygiene is also important to reduce your risk of infection.  Remember - BRUSH YOUR TEETH THE MORNING OF SURGERY WITH YOUR REGULAR TOOTHPASTE  Please do not use if you have an allergy to CHG or antibacterial soaps. If your skin becomes reddened/irritated stop using the CHG.  Do not shave (including legs and underarms) for at least 48 hours prior to first CHG shower. It is OK to shave your face.  Please follow these instructions carefully.   Shower the NIGHT BEFORE SURGERY and the MORNING OF SURGERY with DIAL Soap.   Pat yourself dry with a CLEAN TOWEL.  Wear CLEAN PAJAMAS to bed the night before surgery  Place CLEAN SHEETS on your bed the night of your  first shower and DO NOT SLEEP WITH PETS.   Day of Surgery: Please shower morning of surgery  Wear Clean/Comfortable clothing the morning of surgery Do not apply any deodorants/lotions.   Remember to brush your teeth WITH YOUR REGULAR TOOTHPASTE.   Questions were answered. Patient verbalized understanding of instructions.

## 2023-01-13 NOTE — H&P (Signed)
[facility] ([facility]) H&P  Patient ID: Caitlin Odonnell MRN: [id number] DOB/AGE: [birth year] 67 y.o.  Reason for Surgery: Left humerus nonunion  HPI: Caitlin Odonnell is an 67 y.o. RHD female with PMH significant for hypertension and DM (last Hgb A1c 6.2 on 05/26/2022) presenting for repair of left humeral nonunion.  Patient had a fall at work in September 2022, sustaining a left humerus fracture.  Fracture was initially treated nonoperatively but due to motion at the fracture site 5 months after her initial injury, she underwent ORIF of her left humeral shaft by Dr. Sammuel Hines on on 12/28/2021.  Over a year out from surgery, patient continues to have pain at the fracture site.  She has been using exigent bone stimulator over the fracture for 6 months with no significant progression of callus over the fracture site.  She has been able to use the left arm for daily activities, although this is with pain. Due to complexity of patient's fracture and lack of healing despite conservative measures, Dr. Sammuel Hines asked OTS to evaluate patient and provide second opinion for treatment options.  CT scan performed on 12/22/2022 shows persistent fracture without definitive bridging callus.  Patient presents now for removal of current hardware and repair of left humeral nonunion.  Prior to her initial injury in September 2022, patient had normal function of the left arm.  Past Medical History:  Diagnosis Date   Arthritis    Complication of anesthesia    as a child vomitted after tonsils removed   Diabetes mellitus without complication ([place])    Hypertension    Neuropathy     Past Surgical History:  Procedure Laterality Date   APPENDECTOMY     CATARACT EXTRACTION W/ INTRAOCULAR LENS IMPLANT     CESAREAN SECTION     EYE SURGERY     bilateral   INNER EAR SURGERY     ORIF HUMERUS FRACTURE Left 12/28/2021   Procedure: LEFT OPEN REDUCTION INTERNAL FIXATION (ORIF) HUMERAL SHAFT FRACTURE;  Surgeon: Vanetta Mulders, MD;   Location: [place];  Service: Orthopedics;  Laterality: Left;   TONSILLECTOMY      History reviewed. No pertinent family history.  Social History:  reports that she has never smoked. She has never used smokeless tobacco. She reports that she does not drink alcohol and does not use drugs.  Allergies:  Allergies  Allergen Reactions   Cortisone Other (See Comments)    Had some numbness in her face. States it happened after a cortisone injection in her knee, had never had reaction to it prior to this incident.    Food Other (See Comments)    Tomato-itching    Medications: I have reviewed the patient's current medications. Prior to Admission:  No medications prior to admission.    ROS: Constitutional: No fever or chills Vision: No changes in vision ENT: No difficulty swallowing CV: No chest pain Pulm: No SOB or wheezing GI: No nausea or vomiting GU: No urgency or inability to hold urine Skin: No poor wound healing Neurologic: No numbness or tingling Psychiatric: No depression or anxiety Heme: No bruising Allergic: No reaction to medications or food   Exam: There were no vitals taken for this visit. General: No acute distress Orientation: Alert and oriented x 4.   Mood and Affect: Mood and affect appropriate, pleasant and cooperative Gait: Within normal limits Coordination and balance: Within normal limits  Left upper extremity: Surgical incision from previous is healed no signs of any infection.  Patient is neurovascularly intact  in her upper extremity.  Some pain with movement of the shoulder and abduction of the arm.  No crepitus or instability noted with manipulation of the arm.  Right upper extremity: Skin without lesions. No tenderness to palpation. Full painless ROM, full strength in each muscle group without evidence of instability.  Motor and sensory function intact.  Neurovascularly intact   Medical Decision Making: Data: Imaging: CT scan left humerus performed on  12/22/2022 has been reviewed and shows persistent fracture of the midshaft humerus with callus that has not definitely bridged across fracture gap.  Labs: No results found for this or any previous visit (from the past 168 hour(s)).  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 This has been confirmed on CT scan.  Patient continues to have pain with daily activities.  At this point, I would recommend proceeding with removal of current hardware and repair of the humeral nonunion.  Risk and benefits of procedure were discussed with patient.  She agrees to proceed with surgery.  Will plan to admit the patient overnight for pain control and therapies postoperatively.  Consent will be obtained.  Shona Needles, MD [facility] [phone] (office) [email or web address]

## 2023-01-13 NOTE — Anesthesia Preprocedure Evaluation (Addendum)
Anesthesia Evaluation  Patient identified by MRN, date of birth, ID band Patient awake    Reviewed: Allergy & Precautions, H&P , NPO status , Patient's Chart, lab work & pertinent test results  Airway Mallampati: IV  TM Distance: >3 FB Neck ROM: Full    Dental  (+) Dental Advisory Given, Missing,    Pulmonary neg pulmonary ROS   Pulmonary exam normal breath sounds clear to auscultation       Cardiovascular hypertension, Pt. on medications  Rhythm:Regular Rate:Normal     Neuro/Psych negative neurological ROS  negative psych ROS   GI/Hepatic negative GI ROS, Neg liver ROS,,,  Endo/Other  diabetes, Type 2, Oral Hypoglycemic Agents  Morbid obesity  Renal/GU negative Renal ROS     Musculoskeletal  (+) Arthritis , Osteoarthritis,    Abdominal  (+) + obese  Peds  Hematology negative hematology ROS (+)   Anesthesia Other Findings   Reproductive/Obstetrics                              Anesthesia Physical Anesthesia Plan  ASA: 3  Anesthesia Plan: General   Post-op Pain Management: Regional block* and Tylenol PO (pre-op)*   Induction: Intravenous  PONV Risk Score and Plan: 3 and Ondansetron, Dexamethasone, Midazolam and Treatment may vary due to age or medical condition  Airway Management Planned: Oral ETT and Video Laryngoscope Planned  Additional Equipment:   Intra-op Plan:   Post-operative Plan: Extubation in OR  Informed Consent: I have reviewed the patients History and Physical, chart, labs and discussed the procedure including the risks, benefits and alternatives for the proposed anesthesia with the patient or authorized representative who has indicated his/her understanding and acceptance.     Dental advisory given  Plan Discussed with: CRNA  Anesthesia Plan Comments:          Anesthesia Quick Evaluation

## 2023-01-14 ENCOUNTER — Ambulatory Visit (HOSPITAL_BASED_OUTPATIENT_CLINIC_OR_DEPARTMENT_OTHER): Payer: Medicare Other | Admitting: Anesthesiology

## 2023-01-14 ENCOUNTER — Observation Stay (HOSPITAL_COMMUNITY)
Admission: RE | Admit: 2023-01-14 | Discharge: 2023-01-15 | Disposition: A | Discharge status: Home / Self Care | Payer: Medicare Other | Attending: Student | Admitting: Student

## 2023-01-14 ENCOUNTER — Encounter (HOSPITAL_COMMUNITY): Payer: Self-pay | Admitting: Student

## 2023-01-14 ENCOUNTER — Encounter (HOSPITAL_COMMUNITY)
Admission: RE | Disposition: A | Discharge status: Home / Self Care | Payer: Self-pay | Source: Home / Self Care | Attending: Student

## 2023-01-14 ENCOUNTER — Observation Stay (HOSPITAL_COMMUNITY): Payer: Medicare Other

## 2023-01-14 ENCOUNTER — Ambulatory Visit (HOSPITAL_COMMUNITY): Payer: Medicare Other

## 2023-01-14 ENCOUNTER — Other Ambulatory Visit: Payer: Self-pay

## 2023-01-14 ENCOUNTER — Ambulatory Visit (HOSPITAL_COMMUNITY): Payer: Medicare Other | Admitting: Anesthesiology

## 2023-01-14 DIAGNOSIS — Z7984 Long term (current) use of oral hypoglycemic drugs: Secondary | ICD-10-CM

## 2023-01-14 DIAGNOSIS — S42352K Displaced comminuted fracture of shaft of humerus, left arm, subsequent encounter for fracture with nonunion: Principal | ICD-10-CM | POA: Diagnosis present

## 2023-01-14 DIAGNOSIS — E1140 Type 2 diabetes mellitus with diabetic neuropathy, unspecified: Secondary | ICD-10-CM | POA: Diagnosis not present

## 2023-01-14 DIAGNOSIS — W19XXXA Unspecified fall, initial encounter: Secondary | ICD-10-CM | POA: Insufficient documentation

## 2023-01-14 DIAGNOSIS — I10 Essential (primary) hypertension: Secondary | ICD-10-CM | POA: Insufficient documentation

## 2023-01-14 DIAGNOSIS — Z79899 Other long term (current) drug therapy: Secondary | ICD-10-CM | POA: Insufficient documentation

## 2023-01-14 DIAGNOSIS — E119 Type 2 diabetes mellitus without complications: Secondary | ICD-10-CM

## 2023-01-14 DIAGNOSIS — S42352A Displaced comminuted fracture of shaft of humerus, left arm, initial encounter for closed fracture: Secondary | ICD-10-CM

## 2023-01-14 DIAGNOSIS — S42301K Unspecified fracture of shaft of humerus, right arm, subsequent encounter for fracture with nonunion: Secondary | ICD-10-CM | POA: Diagnosis not present

## 2023-01-14 HISTORY — PX: ORIF HUMERUS FRACTURE: SHX2126

## 2023-01-14 HISTORY — PX: HARVEST BONE GRAFT: SHX377

## 2023-01-14 HISTORY — PX: HARDWARE REMOVAL: SHX979

## 2023-01-14 LAB — CBC
HCT: 41.8 % (ref 36.0–46.0)
Hemoglobin: 13.7 g/dL (ref 12.0–15.0)
MCH: 30.8 pg (ref 26.0–34.0)
MCHC: 32.8 g/dL (ref 30.0–36.0)
MCV: 93.9 fL (ref 80.0–100.0)
Platelets: 377 10*3/uL (ref 150–400)
RBC: 4.45 MIL/uL (ref 3.87–5.11)
RDW: 13.6 % (ref 11.5–15.5)
WBC: 6.8 10*3/uL (ref 4.0–10.5)
nRBC: 0 % (ref 0.0–0.2)

## 2023-01-14 LAB — BASIC METABOLIC PANEL
Anion gap: 13 (ref 5–15)
BUN: 34 mg/dL — ABNORMAL HIGH (ref 8–23)
CO2: 22 mmol/L (ref 22–32)
Calcium: 9.4 mg/dL (ref 8.9–10.3)
Chloride: 103 mmol/L (ref 98–111)
Creatinine, Ser: 0.96 mg/dL (ref 0.44–1.00)
GFR, Estimated: >60 mL/min (ref >60)
Glucose, Bld: 142 mg/dL — ABNORMAL HIGH (ref 70–99)
Potassium: 4.1 mmol/L (ref 3.5–5.1)
Sodium: 138 mmol/L (ref 135–145)

## 2023-01-14 LAB — GLUCOSE, CAPILLARY
Glucose-Capillary: 146 mg/dL — ABNORMAL HIGH (ref 70–99)
Glucose-Capillary: 125 mg/dL — ABNORMAL HIGH (ref 70–99)
Glucose-Capillary: 139 mg/dL — ABNORMAL HIGH (ref 70–99)
Glucose-Capillary: 118 mg/dL — ABNORMAL HIGH (ref 70–99)
Glucose-Capillary: 173 mg/dL — ABNORMAL HIGH (ref 70–99)
Glucose-Capillary: 107 mg/dL — ABNORMAL HIGH (ref 70–99)

## 2023-01-14 SURGERY — OPEN REDUCTION INTERNAL FIXATION (ORIF) HUMERAL SHAFT FRACTURE
Anesthesia: General | Site: Hip | Laterality: Left

## 2023-01-14 MED ORDER — VANCOMYCIN HCL 1000 MG IV SOLR
INTRAVENOUS | Status: AC
  Filled 2023-01-14: qty 20

## 2023-01-14 MED ORDER — MELATONIN 5 MG PO TABS
10.0000 mg | ORAL_TABLET | Freq: Every day | ORAL | Status: DC
  Administered 2023-01-14: 10 mg via ORAL
  Filled 2023-01-14: qty 2

## 2023-01-14 MED ORDER — ENOXAPARIN SODIUM 40 MG/0.4ML IJ SOSY
40.0000 mg | PREFILLED_SYRINGE | INTRAMUSCULAR | Status: DC
  Administered 2023-01-15: 40 mg via SUBCUTANEOUS
  Filled 2023-01-14: qty 0.4

## 2023-01-14 MED ORDER — DOCUSATE SODIUM 100 MG PO CAPS
100.0000 mg | ORAL_CAPSULE | Freq: Two times a day (BID) | ORAL | Status: DC
  Administered 2023-01-14 – 2023-01-15 (×2): 100 mg via ORAL
  Filled 2023-01-14 (×2): qty 1

## 2023-01-14 MED ORDER — METHOCARBAMOL 500 MG PO TABS
500.0000 mg | ORAL_TABLET | Freq: Four times a day (QID) | ORAL | Status: DC | PRN
  Administered 2023-01-14: 500 mg via ORAL
  Filled 2023-01-14: qty 1

## 2023-01-14 MED ORDER — ASPIRIN 81 MG PO TBEC: 81.0000 mg | DELAYED_RELEASE_TABLET | Freq: Every day | ORAL | 0 refills | Status: AC

## 2023-01-14 MED ORDER — SUGAMMADEX SODIUM 200 MG/2ML IV SOLN
INTRAVENOUS | Status: DC | PRN
  Administered 2023-01-14: 300 mg via INTRAVENOUS

## 2023-01-14 MED ORDER — ONDANSETRON HCL 4 MG/2ML IJ SOLN
INTRAMUSCULAR | Status: DC | PRN
  Administered 2023-01-14: 4 mg via INTRAVENOUS

## 2023-01-14 MED ORDER — METHOCARBAMOL 1000 MG/10ML IJ SOLN: 500.0000 mg | Freq: Four times a day (QID) | INTRAVENOUS | Status: DC | PRN

## 2023-01-14 MED ORDER — 0.9 % SODIUM CHLORIDE (POUR BTL) OPTIME
TOPICAL | Status: DC | PRN
  Administered 2023-01-14: 1000 mL

## 2023-01-14 MED ORDER — CHLORHEXIDINE GLUCONATE 0.12 % MT SOLN
15.0000 mL | Freq: Once | OROMUCOSAL | Status: AC
  Administered 2023-01-14: 15 mL via OROMUCOSAL
  Filled 2023-01-14: qty 15

## 2023-01-14 MED ORDER — PHENYLEPHRINE 80 MCG/ML (10ML) SYRINGE FOR IV PUSH (FOR BLOOD PRESSURE SUPPORT)
PREFILLED_SYRINGE | INTRAVENOUS | Status: DC | PRN
  Administered 2023-01-14: 80 ug via INTRAVENOUS

## 2023-01-14 MED ORDER — ACETAMINOPHEN 325 MG PO TABS: 325.0000 mg | ORAL_TABLET | Freq: Four times a day (QID) | ORAL | Status: DC | PRN

## 2023-01-14 MED ORDER — PROMETHAZINE HCL 25 MG/ML IJ SOLN: 6.2500 mg | INTRAMUSCULAR | Status: DC | PRN

## 2023-01-14 MED ORDER — ROCURONIUM BROMIDE 10 MG/ML (PF) SYRINGE
PREFILLED_SYRINGE | INTRAVENOUS | Status: DC | PRN
  Administered 2023-01-14: 20 mg via INTRAVENOUS
  Administered 2023-01-14: 70 mg via INTRAVENOUS

## 2023-01-14 MED ORDER — MAGNESIUM GLUCONATE 500 MG PO TABS
500.0000 mg | ORAL_TABLET | Freq: Every day | ORAL | Status: DC
  Administered 2023-01-14: 500 mg via ORAL
  Filled 2023-01-14: qty 1

## 2023-01-14 MED ORDER — ACETAMINOPHEN 325 MG PO TABS: 325.0000 mg | ORAL_TABLET | Freq: Four times a day (QID) | ORAL | Status: AC | PRN

## 2023-01-14 MED ORDER — BUPIVACAINE HCL (PF) 0.25 % IJ SOLN
INTRAMUSCULAR | Status: AC
  Filled 2023-01-14: qty 30

## 2023-01-14 MED ORDER — LACTATED RINGERS IV SOLN: INTRAVENOUS | Status: DC

## 2023-01-14 MED ORDER — HYDRALAZINE HCL 10 MG PO TABS: 10.0000 mg | ORAL_TABLET | Freq: Four times a day (QID) | ORAL | Status: DC | PRN

## 2023-01-14 MED ORDER — AMISULPRIDE (ANTIEMETIC) 5 MG/2ML IV SOLN: 10.0000 mg | Freq: Once | INTRAVENOUS | Status: DC | PRN

## 2023-01-14 MED ORDER — CEFAZOLIN IN SODIUM CHLORIDE 3-0.9 GM/100ML-% IV SOLN
3.0000 g | INTRAVENOUS | Status: AC
  Administered 2023-01-14: 3 g via INTRAVENOUS
  Filled 2023-01-14: qty 100

## 2023-01-14 MED ORDER — LIDOCAINE 2% (20 MG/ML) 5 ML SYRINGE
INTRAMUSCULAR | Status: DC | PRN
  Administered 2023-01-14: 100 mg via INTRAVENOUS

## 2023-01-14 MED ORDER — METOCLOPRAMIDE HCL 5 MG PO TABS: 5.0000 mg | ORAL_TABLET | Freq: Three times a day (TID) | ORAL | Status: DC | PRN

## 2023-01-14 MED ORDER — PANTOPRAZOLE SODIUM 40 MG PO TBEC
40.0000 mg | DELAYED_RELEASE_TABLET | Freq: Every day | ORAL | Status: DC
  Administered 2023-01-15: 40 mg via ORAL
  Filled 2023-01-14: qty 1

## 2023-01-14 MED ORDER — FENTANYL CITRATE (PF) 250 MCG/5ML IJ SOLN
INTRAMUSCULAR | Status: AC
  Filled 2023-01-14: qty 5

## 2023-01-14 MED ORDER — HYDROCODONE-ACETAMINOPHEN 7.5-325 MG PO TABS
1.0000 | ORAL_TABLET | ORAL | Status: DC | PRN
  Administered 2023-01-14: 2 via ORAL
  Administered 2023-01-14 – 2023-01-15 (×2): 1 via ORAL
  Filled 2023-01-14 (×2): qty 1

## 2023-01-14 MED ORDER — POLYETHYLENE GLYCOL 3350 17 G PO PACK: 17.0000 g | PACK | Freq: Every day | ORAL | Status: DC | PRN

## 2023-01-14 MED ORDER — AMLODIPINE BESYLATE 5 MG PO TABS
5.0000 mg | ORAL_TABLET | Freq: Every morning | ORAL | Status: DC
  Administered 2023-01-15: 5 mg via ORAL
  Filled 2023-01-14: qty 1

## 2023-01-14 MED ORDER — PROPOFOL 10 MG/ML IV BOLUS
INTRAVENOUS | Status: AC
  Filled 2023-01-14: qty 20

## 2023-01-14 MED ORDER — LISINOPRIL 20 MG PO TABS
20.0000 mg | ORAL_TABLET | Freq: Every day | ORAL | Status: DC
  Administered 2023-01-14: 20 mg via ORAL
  Filled 2023-01-14: qty 1

## 2023-01-14 MED ORDER — LISINOPRIL-HYDROCHLOROTHIAZIDE 20-25 MG PO TABS: 1.0000 | ORAL_TABLET | Freq: Every day | ORAL | Status: DC

## 2023-01-14 MED ORDER — FENTANYL CITRATE (PF) 100 MCG/2ML IJ SOLN
INTRAMUSCULAR | Status: AC
  Filled 2023-01-14: qty 2

## 2023-01-14 MED ORDER — METOCLOPRAMIDE HCL 5 MG/ML IJ SOLN: 5.0000 mg | Freq: Three times a day (TID) | INTRAMUSCULAR | Status: DC | PRN

## 2023-01-14 MED ORDER — MIDAZOLAM HCL 2 MG/2ML IJ SOLN
INTRAMUSCULAR | Status: AC
  Filled 2023-01-14: qty 2

## 2023-01-14 MED ORDER — DIPHENHYDRAMINE HCL 12.5 MG/5ML PO ELIX: 12.5000 mg | ORAL_SOLUTION | ORAL | Status: DC | PRN

## 2023-01-14 MED ORDER — SODIUM CHLORIDE 0.9 % IV SOLN: INTRAVENOUS | Status: DC

## 2023-01-14 MED ORDER — METFORMIN HCL 500 MG PO TABS
1000.0000 mg | ORAL_TABLET | Freq: Every day | ORAL | Status: DC
  Administered 2023-01-15: 1000 mg via ORAL
  Filled 2023-01-14: qty 2

## 2023-01-14 MED ORDER — HYDROCODONE-ACETAMINOPHEN 5-325 MG PO TABS
1.0000 | ORAL_TABLET | ORAL | Status: DC | PRN
  Administered 2023-01-15: 2 via ORAL
  Filled 2023-01-14: qty 2

## 2023-01-14 MED ORDER — PROPOFOL 10 MG/ML IV BOLUS
INTRAVENOUS | Status: DC | PRN
  Administered 2023-01-14: 200 mg via INTRAVENOUS

## 2023-01-14 MED ORDER — PREGABALIN 25 MG PO CAPS
50.0000 mg | ORAL_CAPSULE | Freq: Every day | ORAL | Status: DC
  Administered 2023-01-14: 50 mg via ORAL
  Filled 2023-01-14: qty 2

## 2023-01-14 MED ORDER — HYDROCODONE-ACETAMINOPHEN 5-325 MG PO TABS: 1.0000 | ORAL_TABLET | ORAL | 0 refills | Status: AC | PRN

## 2023-01-14 MED ORDER — MORPHINE SULFATE (PF) 2 MG/ML IV SOLN: 0.5000 mg | INTRAVENOUS | Status: DC | PRN

## 2023-01-14 MED ORDER — FENTANYL CITRATE (PF) 250 MCG/5ML IJ SOLN
INTRAMUSCULAR | Status: DC | PRN
  Administered 2023-01-14 (×2): 50 ug via INTRAVENOUS

## 2023-01-14 MED ORDER — ACETAMINOPHEN 500 MG PO TABS
1000.0000 mg | ORAL_TABLET | Freq: Once | ORAL | Status: AC
  Administered 2023-01-14: 1000 mg via ORAL
  Filled 2023-01-14: qty 2

## 2023-01-14 MED ORDER — HYDROCHLOROTHIAZIDE 25 MG PO TABS
25.0000 mg | ORAL_TABLET | Freq: Every day | ORAL | Status: DC
  Administered 2023-01-14: 25 mg via ORAL
  Filled 2023-01-14: qty 1

## 2023-01-14 MED ORDER — ALBUTEROL SULFATE HFA 108 (90 BASE) MCG/ACT IN AERS
INHALATION_SPRAY | RESPIRATORY_TRACT | Status: DC | PRN
  Administered 2023-01-14: 4 via RESPIRATORY_TRACT

## 2023-01-14 MED ORDER — VANCOMYCIN HCL 1000 MG IV SOLR
INTRAVENOUS | Status: DC | PRN
  Administered 2023-01-14: 1000 mg via TOPICAL

## 2023-01-14 MED ORDER — PHENYLEPHRINE HCL-NACL 20-0.9 MG/250ML-% IV SOLN
INTRAVENOUS | Status: DC | PRN
  Administered 2023-01-14: 25 ug/min via INTRAVENOUS

## 2023-01-14 MED ORDER — MEPERIDINE HCL 25 MG/ML IJ SOLN: 6.2500 mg | INTRAMUSCULAR | Status: DC | PRN

## 2023-01-14 MED ORDER — INSULIN ASPART 100 UNIT/ML IJ SOLN
0.0000 [IU] | Freq: Three times a day (TID) | INTRAMUSCULAR | Status: DC
  Administered 2023-01-14: 4 [IU] via SUBCUTANEOUS
  Administered 2023-01-15: 0.4 [IU] via SUBCUTANEOUS

## 2023-01-14 MED ORDER — ORAL CARE MOUTH RINSE: 15.0000 mL | Freq: Once | OROMUCOSAL | Status: AC

## 2023-01-14 MED ORDER — MIDAZOLAM HCL 2 MG/2ML IJ SOLN
INTRAMUSCULAR | Status: DC | PRN
  Administered 2023-01-14 (×2): 1 mg via INTRAVENOUS

## 2023-01-14 MED ORDER — HYDROCODONE-ACETAMINOPHEN 7.5-325 MG PO TABS
ORAL_TABLET | ORAL | Status: AC
  Filled 2023-01-14: qty 2

## 2023-01-14 MED ORDER — ONDANSETRON HCL 4 MG PO TABS: 4.0000 mg | ORAL_TABLET | Freq: Four times a day (QID) | ORAL | Status: DC | PRN

## 2023-01-14 MED ORDER — SIMVASTATIN 5 MG PO TABS
5.0000 mg | ORAL_TABLET | Freq: Every day | ORAL | Status: DC
  Administered 2023-01-14: 5 mg via ORAL
  Filled 2023-01-14 (×2): qty 1

## 2023-01-14 MED ORDER — METHOCARBAMOL 500 MG PO TABS: 500.0000 mg | ORAL_TABLET | Freq: Four times a day (QID) | ORAL | 0 refills | Status: AC | PRN

## 2023-01-14 MED ORDER — FENTANYL CITRATE (PF) 100 MCG/2ML IJ SOLN
25.0000 ug | INTRAMUSCULAR | Status: DC | PRN
  Administered 2023-01-14: 25 ug via INTRAVENOUS
  Administered 2023-01-14: 50 ug via INTRAVENOUS
  Administered 2023-01-14: 25 ug via INTRAVENOUS
  Administered 2023-01-14: 50 ug via INTRAVENOUS

## 2023-01-14 MED ORDER — SUCCINYLCHOLINE CHLORIDE 200 MG/10ML IV SOSY
PREFILLED_SYRINGE | INTRAVENOUS | Status: DC | PRN
  Administered 2023-01-14: 130 mg via INTRAVENOUS

## 2023-01-14 MED ORDER — ONDANSETRON HCL 4 MG/2ML IJ SOLN: 4.0000 mg | Freq: Four times a day (QID) | INTRAMUSCULAR | Status: DC | PRN

## 2023-01-14 MED ORDER — MELOXICAM 7.5 MG PO TABS
15.0000 mg | ORAL_TABLET | Freq: Every evening | ORAL | Status: DC
  Administered 2023-01-14: 15 mg via ORAL
  Filled 2023-01-14 (×3): qty 2

## 2023-01-14 MED ORDER — CEFAZOLIN SODIUM-DEXTROSE 2-4 GM/100ML-% IV SOLN
2.0000 g | Freq: Three times a day (TID) | INTRAVENOUS | Status: AC
  Administered 2023-01-14 – 2023-01-15 (×3): 2 g via INTRAVENOUS
  Filled 2023-01-14 (×3): qty 100

## 2023-01-14 MED ORDER — FUROSEMIDE 20 MG PO TABS
10.0000 mg | ORAL_TABLET | Freq: Every day | ORAL | Status: DC
  Administered 2023-01-15: 10 mg via ORAL
  Filled 2023-01-14: qty 1

## 2023-01-14 MED ORDER — HYDROXYZINE HCL 25 MG PO TABS
25.0000 mg | ORAL_TABLET | Freq: Every day | ORAL | Status: DC
  Administered 2023-01-14: 25 mg via ORAL
  Filled 2023-01-14: qty 1

## 2023-01-14 SURGICAL SUPPLY — 103 items
ADH SKN CLS APL DERMABOND .7 (GAUZE/BANDAGES/DRESSINGS) ×2
APL PRP STRL LF DISP 70% ISPRP (MISCELLANEOUS) ×4
BAG COUNTER SPONGE SURGICOUNT (BAG) ×2 IMPLANT
BAG SPNG CNTER NS LX DISP (BAG) ×2
BANDAGE ESMARK 6X9 LF (GAUZE/BANDAGES/DRESSINGS) ×2 IMPLANT
BIT DRILL Q/COUPLING 1 (BIT) IMPLANT
BIT DRILL QC 2.5X135 (BIT) IMPLANT
BLADE SURG 10 STRL SS (BLADE) IMPLANT
BNDG CMPR 5X6 CHSV STRCH STRL (GAUZE/BANDAGES/DRESSINGS)
BNDG CMPR 9X6 STRL LF SNTH (GAUZE/BANDAGES/DRESSINGS)
BNDG COHESIVE 4X5 TAN STRL (GAUZE/BANDAGES/DRESSINGS) ×2 IMPLANT
BNDG COHESIVE 6X5 TAN ST LF (GAUZE/BANDAGES/DRESSINGS) ×2 IMPLANT
BNDG ELASTIC 4X5.8 VLCR STR LF (GAUZE/BANDAGES/DRESSINGS) ×2 IMPLANT
BNDG ELASTIC 6X5.8 VLCR STR LF (GAUZE/BANDAGES/DRESSINGS) ×2 IMPLANT
BNDG ESMARK 6X9 LF (GAUZE/BANDAGES/DRESSINGS)
BNDG GAUZE DERMACEA FLUFF 4 (GAUZE/BANDAGES/DRESSINGS) ×4 IMPLANT
BNDG GZE DERMACEA 4 6PLY (GAUZE/BANDAGES/DRESSINGS)
BONE CANC CHIPS 20CC PCAN1/4 (Bone Implant) ×2 IMPLANT
BRUSH SCRUB EZ PLAIN DRY (MISCELLANEOUS) ×4 IMPLANT
CHIPS CANC BONE 20CC PCAN1/4 (Bone Implant) ×2 IMPLANT
CHLORAPREP W/TINT 26 (MISCELLANEOUS) ×2 IMPLANT
COVER SURGICAL LIGHT HANDLE (MISCELLANEOUS) ×4 IMPLANT
CUFF TOURN SGL QUICK 18X4 (TOURNIQUET CUFF) IMPLANT
CUFF TOURN SGL QUICK 24 (TOURNIQUET CUFF)
CUFF TOURN SGL QUICK 34 (TOURNIQUET CUFF)
CUFF TRNQT CYL 24X4X16.5-23 (TOURNIQUET CUFF) IMPLANT
CUFF TRNQT CYL 34X4.125X (TOURNIQUET CUFF) IMPLANT
DERMABOND ADVANCED .7 DNX12 (GAUZE/BANDAGES/DRESSINGS) ×4 IMPLANT
DRAPE C-ARM 42X72 X-RAY (DRAPES) ×2 IMPLANT
DRAPE C-ARMOR (DRAPES) ×2 IMPLANT
DRAPE INCISE IOBAN 66X45 STRL (DRAPES) ×2 IMPLANT
DRAPE ORTHO SPLIT 77X108 STRL (DRAPES) ×4
DRAPE SURG 17X23 STRL (DRAPES) ×2 IMPLANT
DRAPE SURG ORHT 6 SPLT 77X108 (DRAPES) ×4 IMPLANT
DRAPE U-SHAPE 47X51 STRL (DRAPES) ×4 IMPLANT
DRESSING MEPILEX FLEX 4X4 (GAUZE/BANDAGES/DRESSINGS) IMPLANT
DRSG ADAPTIC 3X8 NADH LF (GAUZE/BANDAGES/DRESSINGS) ×2 IMPLANT
DRSG MEPILEX FLEX 4X4 (GAUZE/BANDAGES/DRESSINGS) ×2
DRSG MEPILEX POST OP 4X8 (GAUZE/BANDAGES/DRESSINGS) ×2 IMPLANT
ELECT REM PT RETURN 9FT ADLT (ELECTROSURGICAL) ×2
ELECTRODE REM PT RTRN 9FT ADLT (ELECTROSURGICAL) ×2 IMPLANT
EVACUATOR 1/8 PVC DRAIN (DRAIN) IMPLANT
GAUZE PAD ABD 8X10 STRL (GAUZE/BANDAGES/DRESSINGS) ×2 IMPLANT
GAUZE SPONGE 4X4 12PLY STRL (GAUZE/BANDAGES/DRESSINGS) ×2 IMPLANT
GLOVE BIO SURGEON STRL SZ 6.5 (GLOVE) ×6 IMPLANT
GLOVE BIO SURGEON STRL SZ7.5 (GLOVE) ×8 IMPLANT
GLOVE BIOGEL PI IND STRL 6.5 (GLOVE) ×2 IMPLANT
GLOVE BIOGEL PI IND STRL 7.5 (GLOVE) ×2 IMPLANT
GLOVE BIOGEL PI IND STRL 8 (GLOVE) IMPLANT
GLOVE SURG SS PI 8.0 STRL IVOR (GLOVE) IMPLANT
GOWN STRL REIN 3XL LVL4 (GOWN DISPOSABLE) IMPLANT
GOWN STRL REUS W/ TWL LRG LVL3 (GOWN DISPOSABLE) ×4 IMPLANT
GOWN STRL REUS W/TWL LRG LVL3 (GOWN DISPOSABLE) ×4
GRAFT BNE CANC CHIPS 1-8 20CC (Bone Implant) IMPLANT
KIT BASIN OR (CUSTOM PROCEDURE TRAY) ×2 IMPLANT
KIT TURNOVER KIT B (KITS) ×2 IMPLANT
MANIFOLD NEPTUNE II (INSTRUMENTS) ×2 IMPLANT
NDL 22X1.5 STRL (OR ONLY) (MISCELLANEOUS) IMPLANT
NDL BIOPSY JAMSHIDI 8X6 (NEEDLE) IMPLANT
NDL HYPO 25X1 1.5 SAFETY (NEEDLE) ×2 IMPLANT
NEEDLE 22X1.5 STRL (OR ONLY) (MISCELLANEOUS) IMPLANT
NEEDLE BIOPSY JAMSHIDI 8X6 (NEEDLE) ×2 IMPLANT
NEEDLE HYPO 25X1 1.5 SAFETY (NEEDLE) IMPLANT
NS IRRIG 1000ML POUR BTL (IV SOLUTION) ×2 IMPLANT
PACK ORTHO EXTREMITY (CUSTOM PROCEDURE TRAY) ×2 IMPLANT
PAD ARMBOARD 7.5X6 YLW CONV (MISCELLANEOUS) ×4 IMPLANT
PADDING CAST COTTON 6X4 STRL (CAST SUPPLIES) ×6 IMPLANT
PLATE LOCKING 10 HOLE (Plate) IMPLANT
PROS LCP PLATE 10 137M (Plate) ×2 IMPLANT
PROSTHESIS LCP PLATE 10 137M (Plate) IMPLANT
SCREW CORTEX ST 4.5X26 (Screw) IMPLANT
SCREW CORTEX ST 4.5X28 (Screw) IMPLANT
SCREW CORTEX ST 4.5X30 (Screw) IMPLANT
SCREW CORTEX ST 4.5X32 (Screw) IMPLANT
SCREW CORTEX ST 4.5X34 (Screw) IMPLANT
SCREW LOCK CORT ST 3.5X30 (Screw) IMPLANT
SCREW LOCK CORT ST 3.5X32 (Screw) IMPLANT
SCREW LOCK CORT ST 3.5X34 (Screw) IMPLANT
SCREW LOCK CORT ST 3.5X38 (Screw) IMPLANT
SLING ARM FOAM STRAP XLG (SOFTGOODS) IMPLANT
SPONGE T-LAP 18X18 ~~LOC~~+RFID (SPONGE) ×2 IMPLANT
STAPLER VISISTAT 35W (STAPLE) ×2 IMPLANT
STOCKINETTE IMPERVIOUS LG (DRAPES) ×2 IMPLANT
STRIP CLOSURE SKIN 1/2X4 (GAUZE/BANDAGES/DRESSINGS) IMPLANT
SUCTION FRAZIER HANDLE 10FR (MISCELLANEOUS) ×2
SUCTION TUBE FRAZIER 10FR DISP (MISCELLANEOUS) ×2 IMPLANT
SUT ETHILON 3 0 PS 1 (SUTURE) ×4 IMPLANT
SUT MNCRL AB 3-0 PS2 18 (SUTURE) ×2 IMPLANT
SUT MON AB 2-0 CT1 36 (SUTURE) ×2 IMPLANT
SUT PDS AB 2-0 CT1 27 (SUTURE) IMPLANT
SUT PROLENE 0 CT (SUTURE) IMPLANT
SUT VIC AB 0 CT1 27 (SUTURE) ×4
SUT VIC AB 0 CT1 27XBRD ANBCTR (SUTURE) ×4 IMPLANT
SUT VIC AB 2-0 CT1 27 (SUTURE) ×4
SUT VIC AB 2-0 CT1 TAPERPNT 27 (SUTURE) ×4 IMPLANT
SYR CONTROL 10ML LL (SYRINGE) ×2 IMPLANT
TOWEL GREEN STERILE (TOWEL DISPOSABLE) ×6 IMPLANT
TOWEL GREEN STERILE FF (TOWEL DISPOSABLE) ×4 IMPLANT
TRAY FOLEY MTR SLVR 16FR STAT (SET/KITS/TRAYS/PACK) IMPLANT
TUBE CONNECTING 12X1/4 (SUCTIONS) ×2 IMPLANT
UNDERPAD 30X36 HEAVY ABSORB (UNDERPADS AND DIAPERS) ×2 IMPLANT
WATER STERILE IRR 1000ML POUR (IV SOLUTION) ×4 IMPLANT
YANKAUER SUCT BULB TIP NO VENT (SUCTIONS) ×2 IMPLANT

## 2023-01-14 NOTE — Discharge Instructions (Signed)
[facility] Discharge Instructions   General Discharge Instructions  WEIGHT BEARING STATUS:non-weightbearing left arm  RANGE OF MOTION/ACTIVITY:Ok for shoulder and elbow range of motion as tolerated. Use sling as needed for comfort  Wound Care: You may remove your surgical dressing on post-op day 2, (Sunday 01/16/23). Incisions can be left open to air if there is no drainage. Once the incision is completely dry and without drainage, it may be left open to air out.  Showering may begin post-op day #3, (Monday 01/17/23).  Clean incision gently with soap and water.  DVT/PE prophylaxis: Aspirin 81 mg daily x 30 days  Diet: as you were eating previously.  Can use over the counter stool softeners and bowel preparations, such as Miralax, to help with bowel movements.  Narcotics can be constipating.  Be sure to drink plenty of fluids  PAIN MEDICATION USE AND EXPECTATIONS  You have likely been given narcotic medications to help control your pain.  After a traumatic event that results in an fracture (broken bone) with or without surgery, it is ok to use narcotic pain medications to help control one's pain.  We understand that everyone responds to pain differently and each individual patient will be evaluated on a regular basis for the continued need for narcotic medications. Ideally, narcotic medication use should last no more than 6-8 weeks (coinciding with fracture healing).   As a patient it is your responsibility as well to monitor narcotic medication use and report the amount and frequency you use these medications when you come to your office visit.   We would also advise that if you are using narcotic medications, you should take a dose prior to therapy to maximize you participation.  IF YOU ARE ON NARCOTIC MEDICATIONS IT IS NOT PERMISSIBLE TO OPERATE A MOTOR VEHICLE (MOTORCYCLE/CAR/TRUCK/MOPED) OR HEAVY MACHINERY DO NOT MIX NARCOTICS WITH OTHER CNS (CENTRAL NERVOUS SYSTEM)  DEPRESSANTS SUCH AS ALCOHOL   STOP SMOKING OR USING NICOTINE PRODUCTS!!!!  As discussed nicotine severely impairs your body's ability to heal surgical and traumatic wounds but also impairs bone healing.  Wounds and bone heal by forming microscopic blood vessels (angiogenesis) and nicotine is a vasoconstrictor (essentially, shrinks blood vessels).  Therefore, if vasoconstriction occurs to these microscopic blood vessels they essentially disappear and are unable to deliver necessary nutrients to the healing tissue.  This is one modifiable factor that you can do to dramatically increase your chances of healing your injury.    (This means no smoking, no nicotine gum, patches, etc)  DO NOT USE NONSTEROIDAL ANTI-INFLAMMATORY DRUGS (NSAID'S)  Using products such as Advil (ibuprofen), Aleve (naproxen), Motrin (ibuprofen) for additional pain control during fracture healing can delay and/or prevent the healing response.  If you would like to take over the counter (OTC) medication, Tylenol (acetaminophen) is ok.  However, some narcotic medications that are given for pain control contain acetaminophen as well. Therefore, you should not exceed more than 4000 mg of tylenol in a day if you do not have liver disease.  Also note that there are may OTC medicines, such as cold medicines and allergy medicines that my contain tylenol as well.  If you have any questions about medications and/or interactions please ask your doctor/PA or your pharmacist.      ICE AND ELEVATE INJURED/OPERATIVE EXTREMITY  Using ice and elevating the injured extremity above your heart can help with swelling and pain control.  Icing in a pulsatile fashion, such as 20 minutes on and 20 minutes off, can  be followed.    Do not place ice directly on skin. Make sure there is a barrier between to skin and the ice pack.    Using frozen items such as frozen peas works well as the conform nicely to the are that needs to be iced.  USE AN ACE WRAP OR TED  HOSE FOR SWELLING CONTROL  In addition to icing and elevation, Ace wraps or TED hose are used to help limit and resolve swelling.  It is recommended to use Ace wraps or TED hose until you are informed to stop.    When using Ace Wraps start the wrapping distally (farthest away from the body) and wrap proximally (closer to the body)   Example: If you had surgery on your leg or thing and you do not have a splint on, start the ace wrap at the toes and work your way up to the thigh        If you had surgery on your upper extremity and do not have a splint on, start the ace wrap at your fingers and work your way up to the upper arm  IF YOU ARE IN A SPLINT OR CAST DO NOT [place]   If your splint gets wet for any reason please contact the office immediately. You may shower in your splint or cast as long as you keep it dry.  This can be done by wrapping in a cast cover or garbage back (or similar)  Do Not stick any thing down your splint or cast such as pencils, money, or hangers to try and scratch yourself with.  If you feel itchy take benadryl as prescribed on the bottle for itching  IF YOU ARE IN A CAM BOOT (BLACK BOOT)  You may remove boot periodically. Perform daily dressing changes as noted below.  Wash the liner of the boot regularly and wear a sock when wearing the boot. It is recommended that you sleep in the boot until told otherwise   CALL THE OFFICE WITH ANY QUESTIONS OR CONCERNS: [phone]   VISIT OUR WEBSITE FOR ADDITIONAL INFORMATION: [email or web address]     Discharge Pin Site Instructions  Dress pins daily with Kerlix roll starting on POD 2. Wrap the Kerlix so that it tamps the skin down around the pin-skin interface to prevent/limit motion of the skin relative to the pin.  (Pin-skin motion is the primary cause of pain and infection related to external fixator pin sites).  Remove any crust or coagulum that may obstruct drainage with a saline moistened gauze or soap  and water.  After POD 3, if there is no discernable drainage on the pin site dressing, the interval for change can by increased to every other day.  You may shower with the fixator, cleaning all pin sites gently with soap and water.  If you have a surgical wound this needs to be completely dry and without drainage before showering.  The extremity can be lifted by the fixator to facilitate wound care and transfers.  Notify the office/Doctor if you experience increasing drainage, redness, or pain from a pin site, or if you notice purulent (thick, snot-like) drainage.  Discharge Wound Care Instructions  Do NOT apply any ointments, solutions or lotions to pin sites or surgical wounds.  These prevent needed drainage and even though solutions like hydrogen peroxide kill bacteria, they also damage cells lining the pin sites that help fight infection.  Applying lotions or ointments can keep the wounds moist and  can cause them to breakdown and open up as well. This can increase the risk for infection. When in doubt call the office.  Surgical incisions should be dressed daily.  If any drainage is noted, use one layer of adaptic or Mepitel, then gauze, Kerlix, and an ace wrap. - These dressing supplies should be available at local medical supply stores [facility], [facility], etc) as well as Management consultant ([facility], [facility], [place], etc)  Once the incision is completely dry and without drainage, it may be left open to air out.  Showering may begin 36-48 hours later.  Cleaning gently with soap and water.  Traumatic wounds should be dressed daily as well.    One layer of adaptic, gauze, Kerlix, then ace wrap.  The adaptic can be discontinued once the draining has ceased    If you have a wet to dry dressing: wet the gauze with saline the squeeze as much saline out so the gauze is moist (not soaking wet), place moistened gauze over wound, then place a dry gauze over the moist one, followed by  Kerlix wrap, then ace wrap.

## 2023-01-14 NOTE — Anesthesia Procedure Notes (Signed)
Procedure Name: Intubation Date/Time: 01/14/2023 7:41 AM  Performed by: Leonor Liv, CRNAPre-anesthesia Checklist: Patient identified, Emergency Drugs available, Suction available and Patient being monitored Patient Re-evaluated:Patient Re-evaluated prior to induction Oxygen Delivery Method: Circle System Utilized Preoxygenation: Pre-oxygenation with 100% oxygen Induction Type: IV induction Ventilation: Mask ventilation without difficulty and Oral airway inserted - appropriate to patient size Laryngoscope Size: Glidescope and 3 Grade View: Grade I Tube type: Oral Tube size: 7.0 mm Number of attempts: 1 Airway Equipment and Method: Oral airway, Rigid stylet and Video-laryngoscopy Placement Confirmation: ETT inserted through vocal cords under direct vision, positive ETCO2 and breath sounds checked- equal and bilateral Secured at: 21 cm Tube secured with: Tape Dental Injury: Teeth and Oropharynx as per pre-operative assessment

## 2023-01-14 NOTE — Evaluation (Signed)
Physical Therapy Evaluation Patient Details Name: Caitlin Odonnell MRN: [id number] DOB: [birth year] Today's Date: 01/14/2023  History of Present Illness  67 y.o. female admitted 3/15 s/p Repair of left humeral nonunion and Removal of hardware. PMH: hypertension, neuropathy, diabetes mellitus.  Clinical Impression  Patient is s/p above surgery resulting in functional limitations due to the deficits listed below (see PT Problem List). Independent and working for [employer]. Safely maintains NWB through LE, sling adjusted, UE supported end of session. Ambulates with mild instability, furniture walking, declines use of SPC, supervision level for safety today but able to self correct. Pt eventually agreeable to [facility] use at d/c. Declined navigating stairs. Has a rail on Rt to enter home, friend Randall Hiss to help at d/c but not able to help with personal hygiene. Patient will benefit from skilled PT to increase their independence and safety with mobility to allow discharge to the venue listed below.          Recommendations for follow up therapy are one component of a multi-disciplinary discharge planning process, led by the attending physician.  Recommendations may be updated based on patient status, additional functional criteria and insurance authorization.  Follow Up Recommendations Outpatient PT (Once cleared by surgeon)      Assistance Recommended at Discharge Set up Supervision/Assistance  Patient can return home with the following  A little help with bathing/dressing/bathroom;Assistance with cooking/housework;Assist for transportation;Help with stairs or ramp for entrance    Equipment Recommendations Cane  Recommendations for Other Services       Functional Status Assessment Patient has had a recent decline in their functional status and demonstrates the ability to make significant improvements in function in a reasonable and predictable amount of time.     Precautions / Restrictions  Precautions Precautions: Shoulder Shoulder Interventions: Shoulder sling/immobilizer Restrictions Weight Bearing Restrictions: Yes LUE Weight Bearing: Non weight bearing      Mobility  Bed Mobility Overal bed mobility: Modified Independent             General bed mobility comments: no assist, extra time, maintains precautions    Transfers Overall transfer level: Needs assistance Equipment used: None Transfers: Sit to/from Stand Sit to Stand: Supervision           General transfer comment: Supervision for safety, mild unsteadiness but able to self correct without assist. maintains NWB LUE during transfer.    Ambulation/Gait Ambulation/Gait assistance: Supervision Gait Distance (Feet): 100 Feet Assistive device: IV Pole, None Gait Pattern/deviations: Step-through pattern, Wide base of support, Drifts right/left Gait velocity: decr Gait velocity interpretation: <1.8 ft/sec, indicate of risk for recurrent falls   General Gait Details: Minor unsteadiness noted, pt touching walls/furniture for light support. opted to hold IV pole. Declined using SPC however educated on safety and is agreeable to take on at d/c and use. No LOB requiring physical assistance.  Stairs Stairs:  (Declined)          Wheelchair Mobility    Modified Rankin (Stroke Patients Only)       Balance Overall balance assessment: Mild deficits observed, not formally tested                                           Pertinent Vitals/Pain Pain Assessment Pain Assessment: No/denies pain    Home Living Family/patient expects to be discharged to:: Private residence Living Arrangements: Non-relatives/Friends Available Help at Discharge: Other (  Comment) (Best friend Randall Hiss) Type of Home: House Home Access: Stairs to enter Entrance Stairs-Rails: Right Entrance Stairs-Number of Steps: 2-3     Home Equipment: None      Prior Function Prior Level of Function :  Independent/Modified Independent;Working/employed             Mobility Comments: Works at [employer] - no AD, denies falls since injury in 2022 but admits to feeling unsteady and furniture walking. Attributes to neuropathy and resports improves with footware use. ADLs Comments: ind     Hand Dominance        Extremity/Trunk Assessment   Upper Extremity Assessment Upper Extremity Assessment: Defer to OT evaluation    Lower Extremity Assessment Lower Extremity Assessment: Overall WFL for tasks assessed       Communication   Communication: No difficulties  Cognition Arousal/Alertness: Awake/alert Behavior During Therapy: WFL for tasks assessed/performed Overall Cognitive Status: Within Functional Limits for tasks assessed                                          General Comments      Exercises     Assessment/Plan    PT Assessment Patient needs continued PT services  PT Problem List Decreased range of motion;Decreased strength;Decreased activity tolerance;Decreased balance;Decreased mobility;Decreased knowledge of use of DME;Decreased knowledge of precautions;Obesity       PT Treatment Interventions DME instruction;Gait training;Stair training;Functional mobility training;Therapeutic activities;Therapeutic exercise;Balance training;Neuromuscular re-education;Patient/family education;Modalities    PT Goals (Current goals can be found in the Care Plan section)  Acute Rehab PT Goals Patient Stated Goal: Get well, back to work PT Goal Formulation: With patient Time For Goal Achievement: 01/21/23 Potential to Achieve Goals: Good    Frequency Min 3X/week     Co-evaluation               AM-PAC PT "6 Clicks" Mobility  Outcome Measure Help needed turning from your back to your side while in a flat bed without using bedrails?: None Help needed moving from lying on your back to sitting on the side of a flat bed without using bedrails?:  None Help needed moving to and from a bed to a chair (including a wheelchair)?: A Little Help needed standing up from a chair using your arms (e.g., wheelchair or bedside chair)?: A Little Help needed to walk in hospital room?: A Little Help needed climbing 3-5 steps with a railing? : A Little 6 Click Score: 20    End of Session Equipment Utilized During Treatment: Gait belt Activity Tolerance: Patient tolerated treatment well Patient left: in chair;with call bell/phone within reach;with chair alarm set;with SCD's reapplied Nurse Communication: Mobility status PT Visit Diagnosis: Unsteadiness on feet (R26.81);Other abnormalities of gait and mobility (R26.89)    Time: [id number] PT Time Calculation (min) (ACUTE ONLY): 25 min   Charges:   PT Evaluation $PT Eval Low Complexity: 1 Low PT Treatments $Gait Training: 8-22 mins        Candie Mile, PT, DPT Physical Therapist Acute Rehabilitation Services [place]   Ellouise Newer 01/14/2023, 4:48 PM

## 2023-01-14 NOTE — Op Note (Signed)
Orthopaedic Surgery Operative Note (CSN: [id number] ) Date of Surgery: 01/14/2023  Admit Date: 01/14/2023   Diagnoses: Pre-Op Diagnoses: Left humeral nonunion  Post-Op Diagnosis: None  Procedures: CPT 24430-Repair of left humeral nonunion CPT 20680-Removal of hardware left humerus  Surgeons : Primary: Shona Needles, MD  Assistant: Patrecia Pace, PA-C  Location: OR 3   Anesthesia: General with regional block   Antibiotics: Ancef 3g preop with 1 gm vancomycin powder placed topically  Tourniquet time: None    Estimated Blood Loss: [id number] mL  Complications:* No complications entered in OR log *   Specimens: ID Type Source Tests Collected by Time Destination  A : Nonunion Site #1 Tissue Bone AEROBIC/ANAEROBIC CULTURE W GRAM STAIN (SURGICAL/DEEP WOUND) Shona Needles, MD 01/14/2023 [phone]   B : Nonunion Site #2 Tissue Bone AEROBIC/ANAEROBIC CULTURE [place] STAIN (SURGICAL/DEEP WOUND) Shona Needles, MD 01/14/2023 [phone]      Implants: Implant Name Type Inv. Item Serial No. Manufacturer Lot No. LRB No. Used Action  SCREW LOCK CORT ST 3.5X38 - [id number] Screw SCREW LOCK CORT ST 3.5X38  [employer]  Left 1 Implanted  SCREW LOCK CORT ST 3.5X34 - [id number] Screw SCREW LOCK CORT ST 3.5X34  [employer]  Left 3 Implanted  SCREW CORTEX ST 4.5X32 - [id number] Screw SCREW CORTEX ST 4.5X32  [employer]  Left 3 Implanted  PLATE LOCKING 10 HOLE - [id number] Plate PLATE LOCKING 10 HOLE  [employer]  Left 1 Implanted  SCREW CORTEX ST 4.5X34 - [id number] Screw SCREW CORTEX ST 4.5X34  [employer]  Left 1 Implanted  SCREW CORTEX ST 4.5X30 - [id number] Screw SCREW CORTEX ST 4.5X30  [employer]  Left 3 Implanted  SCREW LOCK CORT ST 3.5X32 - [id number] Screw SCREW LOCK CORT ST 3.5X32  [employer]  Left 3 Implanted  PROS LCP PLATE 10 [id number] - 075-GRM Plate PROS LCP PLATE 10 [id number]  [employer]  Left 1 Implanted  SCREW LOCK CORT ST 3.5X30 - [id number]  Screw SCREW LOCK CORT ST 3.5X30  [employer]  Left 1 Implanted  BONE [facility] CHIPS [facility] PCAN1/4 - [id number] Bone Implant BONE [facility] CHIPS [facility] PCAN1/4 [id number] [facility]  Left 1 Implanted     Indications for Surgery: 67 year old female who had a left humeral shaft nonunion that was treated with delayed open reduction internal fixation after failure of nonoperative management.  She persisted with pain and limited function to the left upper extremity.  CT scan was performed which showed persistent nonunion.  Due to the continued fracture gap as well as the dysfunction of her arm I recommend proceeding with repair of left humeral shaft nonunion.  Risks and benefits were discussed with the patient.  Risks included but not limited to bleeding, infection, malunion, nonunion, hardware failure, hardware irritation, nerve or blood vessel injury, DVT, need for further surgery, need for harvesting of autograft, even the possible anesthetic complications.  She agreed to proceed with surgery and consent was obtained.  Operative Findings: 1.  Removal of previous humeral shaft of fixation without problem 2.  Repair of left humeral shaft nonunion using Synthes 3.5 mm interfragmentary screws for compression of the nonunion site with a Synthes narrow 4.5 mm LCP plate anteriorly and a 3.5 mm LCP plate anterior laterally to provide robust screw density to the nonunion site. 3.  Bone grafting of nonunion site using crushed cancellous allograft.  An attempt was made to harvest bone marrow aspirate from her pelvis however due to her body habitus this  was not successful and I felt that increased damage would be obtained by trying to reaccessed the iliac crest.  Procedure: The patient was identified in the preoperative holding area. Consent was confirmed with the patient and their family and all questions were answered. The operative extremity was marked after confirmation with the patient. she was then brought  back to the operating room by our anesthesia colleagues.  She was placed under general anesthetic and carefully transferred over to radiolucent flattop table.  Her left upper extremity was then prepped and draped in usual sterile fashion.  Her left hemipelvis was then draped in prepped in usual sterile fashion as well to be able to harvest iliac crest bone marrow aspirate.  A timeout was performed to verify the patient, the procedure, and the extremity.  Preoperative antibiotics were dosed.  Fluoroscopic imaging showed the persistent nonunion site of the humeral shaft.  The previous incision was made again carried down through skin and subcutaneous tissue.  I then incised through the scar tissue to access the plate and screws as well as the humeral shaft itself.  I was able to remove all the screws without difficulty and then used a Cobb elevator to elevate the plate off of the bone.  I then proceeded to scrape the fibrous tissue off of the humeral shaft and sent this for culture #1.  I then identified the nonunion site using fluoroscopy.  I then used a mixture of curettes, Cobb elevator, and rongeur to remove the fibrous tissue of the nonunion.  I was able to free the fragments enough to be able to compress them together.  I then was able to prep the bone ends as they were rather sclerotic.  I debrided the most sclerotic ends and then used a 2.5 mm drill bit to drill into the canal as well as to drill and the sclerotic bone ends.  I did send the fibrous tissue of the nonunion for culture this is a #2.  I then used a reduction tenaculum to compress and oppose the bone ends at the nonunion site.  Confirmed with fluoroscopy that this was aligned.  I then drilled and placed 3.5 millimeter screws as position of fixation.  Once I had provisional fixation I remove the clamp and then contoured a Synthes narrow 4.5 mm LCP plate and placed this along the anterior aspect of the humerus.  I confirmed positioning with  fluoroscopy and then drilled and placed nonlocking screws proximal and distal to the nonunion site.  A total of 8 screws were placed in total.  Due to the nonunion and the already failed fixation I felt that to plating would be appropriate to provide higher screw density for the nonunion.  As result I then released a portion of the soft tissue to expose the anterior lateral aspect of the humeral shaft.  I then placed a Synthes 3.5 mm LCP plate and drilled and placed 3 screws proximal and distal to the nonunion site.  Excellent fixation was obtained and final fluoroscopic imaging was obtained.  I then percutaneously placed a Jamshidi needle along her iliac crest.  However her body habitus made it difficult to palpate and feel the iliac crest.  I attempted 1 pass with the needle unfortunately the skived off the inner table of the pelvis.  Due to the concern about iatrogenic injury I felt that further attempts were not going to be beneficial and I felt that the risk was too high.  With the apposition of the  nonunion site and the fixation that I had I felt confident with just proceeding with crushed cancellous allograft to supplement the nonunion site.  This is what I did after I irrigated the incision.  I then placed 20 cc of crushed cancellous allograft in the remainder of the nonunion site.  I then placed a gram of vancomycin powder.  I then performed closure of 0 Vicryl, 2-0 Vicryl and 3-0 Monocryl with Dermabond.  Sterile dressings were placed.  The patient was then awoke from anesthesia and taken to the PACU in stable condition  Post Op Plan/Instructions: Patient be nonweightbearing to the left upper extremity.  She will receive postoperative Ancef.  She will be placed on Lovenox while inpatient and discharged on aspirin 81 mg.  She will be admitted overnight for observation.  I was present and performed the entire surgery.  Patrecia Pace, PA-C did assist me throughout the case. An assistant was necessary  given the difficulty in approach, maintenance of reduction and ability to instrument the fracture.   Katha Hamming, MD [facility]

## 2023-01-14 NOTE — Interval H&P Note (Signed)
History and Physical Interval Note:  01/14/2023 7:23 AM  Caitlin Odonnell  has presented today for surgery, with the diagnosis of Right humeral nonunion.  The various methods of treatment have been discussed with the patient and family. After consideration of risks, benefits and other options for treatment, the patient has consented to  Procedure(s): REPAIR OF HUMERAL SHAFT NONUNION (Left) HARDWARE REMOVAL (Left) as a surgical intervention.  The patient's history has been reviewed, patient examined, no change in status, stable for surgery.  I have reviewed the patient's chart and labs.  Questions were answered to the patient's satisfaction.     Lennette Bihari P Rulon Abdalla

## 2023-01-14 NOTE — Transfer of Care (Signed)
Immediate Anesthesia Transfer of Care Note  Patient: Caitlin Odonnell  Procedure(s) Performed: REPAIR OF HUMERAL SHAFT NONUNION (Left: Arm Upper) HARDWARE REMOVAL (Left: Arm Upper) HARVEST ILIAC BONE GRAFT (Left: Hip)  Patient Location: PACU  Anesthesia Type:General  Level of Consciousness: awake, alert , and oriented  Airway & Oxygen Therapy: Patient Spontanous Breathing and Patient connected to nasal cannula oxygen  Post-op Assessment: Report given to RN and Post -op Vital signs reviewed and stable  Post vital signs: Reviewed and stable  Last Vitals:  Vitals Value Taken Time  BP 124/73 01/14/23 1030  Temp    Pulse 77 01/14/23 1032  Resp 19 01/14/23 1032  SpO2 93 % 01/14/23 1032  Vitals shown include unvalidated device data.  Last Pain:  Vitals:   01/14/23 0634  TempSrc:   PainSc: 5          Complications: No notable events documented.

## 2023-01-14 NOTE — Plan of Care (Signed)
  Problem: Education: Goal: Ability to describe self-care measures that may prevent or decrease complications (Diabetes Survival Skills Education) will improve Outcome: Progressing   Problem: Fluid Volume: Goal: Ability to maintain a balanced intake and output will improve Outcome: Progressing   Problem: Education: Goal: Knowledge of General Education information will improve Description: Including pain rating scale, medication(s)/side effects and non-pharmacologic comfort measures Outcome: Progressing   Problem: Clinical Measurements: Goal: Will remain free from infection Outcome: Progressing   Problem: Activity: Goal: Risk for activity intolerance will decrease Outcome: Progressing   Problem: Nutrition: Goal: Adequate nutrition will be maintained Outcome: Progressing   Problem: Elimination: Goal: Will not experience complications related to bowel motility Outcome: Progressing   Problem: Pain Managment: Goal: General experience of comfort will improve Outcome: Progressing   Problem: Education: Goal: Ability to describe self-care measures that may prevent or decrease complications (Diabetes Survival Skills Education) will improve Outcome: Progressing   Problem: Fluid Volume: Goal: Ability to maintain a balanced intake and output will improve Outcome: Progressing   Problem: Education: Goal: Knowledge of General Education information will improve Description: Including pain rating scale, medication(s)/side effects and non-pharmacologic comfort measures Outcome: Progressing   Problem: Clinical Measurements: Goal: Will remain free from infection Outcome: Progressing   Problem: Activity: Goal: Risk for activity intolerance will decrease Outcome: Progressing   Problem: Nutrition: Goal: Adequate nutrition will be maintained Outcome: Progressing   Problem: Elimination: Goal: Will not experience complications related to bowel motility Outcome: Progressing    Problem: Pain Managment: Goal: General experience of comfort will improve Outcome: Progressing

## 2023-01-15 DIAGNOSIS — S42352K Displaced comminuted fracture of shaft of humerus, left arm, subsequent encounter for fracture with nonunion: Secondary | ICD-10-CM | POA: Diagnosis not present

## 2023-01-15 LAB — BASIC METABOLIC PANEL
Anion gap: 9 (ref 5–15)
BUN: 23 mg/dL (ref 8–23)
CO2: 23 mmol/L (ref 22–32)
Calcium: 8.5 mg/dL — ABNORMAL LOW (ref 8.9–10.3)
Chloride: 101 mmol/L (ref 98–111)
Creatinine, Ser: 0.89 mg/dL (ref 0.44–1.00)
GFR, Estimated: >60 mL/min (ref >60)
Glucose, Bld: 125 mg/dL — ABNORMAL HIGH (ref 70–99)
Potassium: 4.2 mmol/L (ref 3.5–5.1)
Sodium: 133 mmol/L — ABNORMAL LOW (ref 135–145)

## 2023-01-15 LAB — CBC
HCT: 36.3 % (ref 36.0–46.0)
Hemoglobin: 11.9 g/dL — ABNORMAL LOW (ref 12.0–15.0)
MCH: 30.7 pg (ref 26.0–34.0)
MCHC: 32.8 g/dL (ref 30.0–36.0)
MCV: 93.8 fL (ref 80.0–100.0)
Platelets: 309 10*3/uL (ref 150–400)
RBC: 3.87 MIL/uL (ref 3.87–5.11)
RDW: 13.8 % (ref 11.5–15.5)
WBC: 8.9 10*3/uL (ref 4.0–10.5)
nRBC: 0 % (ref 0.0–0.2)

## 2023-01-15 LAB — GLUCOSE, CAPILLARY
Glucose-Capillary: 155 mg/dL — ABNORMAL HIGH (ref 70–99)
Glucose-Capillary: 106 mg/dL — ABNORMAL HIGH (ref 70–99)

## 2023-01-15 NOTE — Progress Notes (Signed)
Subjective: 1 Day Post-Op s/p Procedure(s): REPAIR OF HUMERAL SHAFT NONUNION HARDWARE REMOVAL HARVEST ILIAC BONE GRAFT   Patient is alert, oriented. Pain well controlled, has feeling back in hand but block still in place around shoulder. Denies chest pain, SOB, Calf pain. No nausea/vomiting. No other complaints. Ready to discharge home.   Objective:  PE: VITALS:   Vitals:   01/14/23 1501 01/14/23 2019 01/15/23 0014 01/15/23 0357  BP: 128/64 121/61 (!) 115/55 113/66  Pulse: 79 78 80 80  Resp: 17 20 17 17   Temp: 97.6 F (36.4 C) 98.3 F (36.8 C)    TempSrc: Oral Oral    SpO2: 99% 96% 91% 97%  Weight:      Height:       General: sitting up on side of bed, in no acute distress Resp: normal respiratory effort MSK: LUE in sling. Dressing CDI. Able to flex, extend, and abduct all fingers of left hand. Distal sensation intact. Fingers warm.   LABS  Results for orders placed or performed during the hospital encounter of 01/14/23 (from the past 24 hour(s))  Aerobic/Anaerobic Culture w Gram Stain (surgical/deep wound)     Status: None (Preliminary result)   Collection Time: 01/14/23  8:34 AM   Specimen: Bone; Tissue  Result Value Ref Range   Specimen Description BONE    Special Requests NONE    Gram Stain      FEW WBC PRESENT,BOTH PMN AND MONONUCLEAR NO ORGANISMS SEEN Performed at [facility], [address] [place], [place] [address]    Culture PENDING    Report Status PENDING   Glucose, capillary     Status: Abnormal   Collection Time: 01/14/23  8:58 AM  Result Value Ref Range   Glucose-Capillary 125 (H) 70 - 99 mg/dL  Glucose, capillary     Status: Abnormal   Collection Time: 01/14/23 10:29 AM  Result Value Ref Range   Glucose-Capillary 139 (H) 70 - 99 mg/dL  Glucose, capillary     Status: Abnormal   Collection Time: 01/14/23  2:29 PM  Result Value Ref Range   Glucose-Capillary 118 (H) 70 - 99 mg/dL  Glucose, capillary     Status: Abnormal   Collection  Time: 01/14/23  4:16 PM  Result Value Ref Range   Glucose-Capillary 173 (H) 70 - 99 mg/dL  Glucose, capillary     Status: Abnormal   Collection Time: 01/14/23  9:51 PM  Result Value Ref Range   Glucose-Capillary 107 (H) 70 - 99 mg/dL  CBC     Status: Abnormal   Collection Time: 01/15/23  2:25 AM  Result Value Ref Range   WBC 8.9 4.0 - 10.5 K/uL   RBC 3.87 3.87 - 5.11 MIL/uL   Hemoglobin 11.9 (L) 12.0 - 15.0 g/dL   HCT 36.3 36.0 - 46.0 %   MCV 93.8 80.0 - 100.0 fL   MCH 30.7 26.0 - 34.0 pg   MCHC 32.8 30.0 - 36.0 g/dL   RDW 13.8 11.5 - 15.5 %   Platelets 309 150 - 400 K/uL   nRBC 0.0 0.0 - 0.2 %  Basic metabolic panel     Status: Abnormal   Collection Time: 01/15/23  2:25 AM  Result Value Ref Range   Sodium 133 (L) 135 - 145 mmol/L   Potassium 4.2 3.5 - 5.1 mmol/L   Chloride 101 98 - 111 mmol/L   CO2 23 22 - 32 mmol/L   Glucose, Bld 125 (H) 70 -  99 mg/dL   BUN 23 8 - 23 mg/dL   Creatinine, Ser 0.89 0.44 - 1.00 mg/dL   Calcium 8.5 (L) 8.9 - 10.3 mg/dL   GFR, Estimated >60 >60 mL/min   Anion gap 9 5 - 15  Glucose, capillary     Status: Abnormal   Collection Time: 01/15/23  7:31 AM  Result Value Ref Range   Glucose-Capillary 155 (H) 70 - 99 mg/dL    DG Humerus Left  Result Date: 01/14/2023 CLINICAL DATA:  Postoperative. EXAM: LEFT HUMERUS - 2+ VIEW COMPARISON:  Intraoperative fluoroscopy left humerus 01/14/2023, left shoulder radiographs 11/17/2022, CT left humerus 12/22/2022 FINDINGS: Interval removal of the prior lateral plate and screw fixation hardware, placement of two screws traversing the oblique nonunited fracture of the left humeral midshaft, and placement of two new sets of long plates and screws fixating the fracture. Near anatomic alignment with unchanged chronic bone hypertrophy/spurring of the proximal fracture component extending posteriorly. IMPRESSION: Interval removal of the prior lateral plate and screw fixation hardware, placement of two screws traversing the  oblique nonunited fracture of the left humeral midshaft, and placement of two new sets of long plates and screws fixate the fracture. No evidence of hardware failure. Electronically Signed   By: Yvonne Kendall M.D.   On: 01/14/2023 11:03   DG Humerus Left  Result Date: 01/14/2023 CLINICAL DATA:  Fluoroscopic guidance for repair of humeral shaft nonunion. EXAM: LEFT HUMERUS - 2+ VIEW COMPARISON:  CT examination dated December 22, 2018 FINDINGS: Multiple intraoperative fluoroscopic images for repair of humeral shaft nonunion. Total fluoroscopic time was 51 seconds and fluoroscopic dose was 3.43 mGy. IMPRESSION: Multiple intraoperative fluoroscopic images for repair of humeral shaft nonunion. Electronically Signed   By: Keane Police D.O.   On: 01/14/2023 10:08   DG C-Arm 1-60 Min-No Report  Result Date: 01/14/2023 Fluoroscopy was utilized by the requesting physician.  No radiographic interpretation.   DG C-Arm 1-60 Min-No Report  Result Date: 01/14/2023 Fluoroscopy was utilized by the requesting physician.  No radiographic interpretation.    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 Doreatha Martin Dispo: discharge home today  Contact information:   Merlene Pulling, PA-C Weekdays 8-5  After hours and holidays please check [email or web address] for group call information for [facility]  Ventura Bruns 01/15/2023, 7:49 AM

## 2023-01-15 NOTE — Plan of Care (Signed)
  Problem: Nutritional: Goal: Maintenance of adequate nutrition will improve Outcome: Progressing   Problem: Skin Integrity: Goal: Risk for impaired skin integrity will decrease Outcome: Progressing   Problem: Activity: Goal: Risk for activity intolerance will decrease Outcome: Progressing   

## 2023-01-15 NOTE — Evaluation (Signed)
Occupational Therapy Evaluation Patient Details Name: Caitlin Odonnell MRN: [id number] DOB: 13-Feb-1956 Today's Date: 01/15/2023   History of Present Illness 67 y.o. female admitted 3/15 s/p Repair of left humeral nonunion and Removal of hardware. PMH: hypertension, neuropathy, diabetes mellitus.   Clinical Impression   PTA, pt lived with her best friend and was independent for ADL, IAD, driving, and working. Upon eval, pt requires set-up for UB ADL and up to min guard A for LB ADL. Pt educated and demonstrating use of compensatory techniques for sleep positioning, UB ADL, sling application, LB Adl, bathing, sling wear schedule and other ADL within precautions. Pt performing AROM elbow/wrist/hand and pendulums during session. Overall good maintenance of NWB precautions. Recommending follow physician orders for follow up therapies.    Recommendations for follow up therapy are one component of a multi-disciplinary discharge planning process, led by the attending physician.  Recommendations may be updated based on patient status, additional functional criteria and insurance authorization.   Follow Up Recommendations  Follow physician's recommendations for discharge plan and follow up therapies     Assistance Recommended at Discharge Intermittent Supervision/Assistance  Patient can return home with the following A little help with walking and/or transfers;A little help with bathing/dressing/bathroom;Assistance with cooking/housework;Assist for transportation;Help with stairs or ramp for entrance    Functional Status Assessment  Patient has had a recent decline in their functional status and demonstrates the ability to make significant improvements in function in a reasonable and predictable amount of time.  Equipment Recommendations  None recommended by OT    Recommendations for Other Services       Precautions / Restrictions Precautions Precautions: Shoulder Type of Shoulder Precautions: NWB.  Per PA consulted, OK for AROM elbow/wrist/hand; OK for pendulums and shoulder ROM to tolerance. Shoulder Interventions: Shoulder sling/immobilizer Precaution Booklet Issued: Yes (comment) Precaution Comments: shoulder dc sheet provided as well as elbow/wrist/hand, pendulums sheet. sling education Required Braces or Orthoses: Sling Restrictions Weight Bearing Restrictions: Yes LUE Weight Bearing: Non weight bearing      Mobility Bed Mobility Overal bed mobility: Modified Independent             General bed mobility comments: no assist, extra time, maintains precautions    Transfers Overall transfer level: Needs assistance Equipment used: None Transfers: Sit to/from Stand Sit to Stand: Supervision           General transfer comment: Supervision for safety, maintains NWB LUE during transfer.      Balance Overall balance assessment: Mild deficits observed, not formally tested                                         ADL either performed or assessed with clinical judgement   ADL Overall ADL's : Needs assistance/impaired Eating/Feeding: Set up;Sitting   Grooming: Supervision/safety;Standing   Upper Body Bathing: Supervision/ safety;Standing   Lower Body Bathing: Sit to/from stand;Supervison/ safety   Upper Body Dressing : Supervision/safety;Set up;Sitting Upper Body Dressing Details (indicate cue type and reason): donning undergarments and dress with set-up A. Good use of compensatory techniques Lower Body Dressing: Min guard;Sit to/from stand Lower Body Dressing Details (indicate cue type and reason): Unable to don socks, but was wearing a sandal with back and able to don. Min cues for NWB precautions when bending forward toward feet Toilet Transfer: Supervision/safety;Ambulation   Toileting- Clothing Manipulation and Hygiene: Modified independent;Sitting/lateral lean   Tub/ Shower Transfer:  Tub transfer;Min guard;Ambulation;Grab bars    Functional mobility during ADLs: Supervision/safety (cane)       Vision Ability to See in Adequate Light: 0 Adequate Patient Visual Report: No change from baseline Vision Assessment?: No apparent visual deficits Additional Comments: WFL for tasks assessed     Perception Perception Perception Tested?: No   Praxis Praxis Praxis tested?: Within functional limits    Pertinent Vitals/Pain Pain Assessment Pain Assessment: No/denies pain     Hand Dominance Right   Extremity/Trunk Assessment Upper Extremity Assessment Upper Extremity Assessment: LUE deficits/detail LUE Deficits / Details: Elbow/wrist/hand WFL. Pt with decr ROM shoulder; able to initiate motions, but very limited motion due to nerve block still active. LUE Coordination: decreased gross motor   Lower Extremity Assessment Lower Extremity Assessment: Defer to PT evaluation   Cervical / Trunk Assessment Cervical / Trunk Assessment: Other exceptions Cervical / Trunk Exceptions: forward rounding of shoulders   Communication Communication Communication: No difficulties   Cognition Arousal/Alertness: Awake/alert Behavior During Therapy: WFL for tasks assessed/performed Overall Cognitive Status: Within Functional Limits for tasks assessed                                       General Comments  VSS    Exercises Exercises: Shoulder Shoulder Exercises Pendulum Exercise: PROM, Left, 5 reps Elbow Flexion: AROM, Left, 10 reps, Seated Elbow Extension: AROM, Left, 10 reps, Seated Wrist Flexion: AROM, Left, 10 reps, Seated Wrist Extension: AROM, Left, 10 reps, Seated Digit Composite Flexion: AROM, Left, 10 reps, Seated Composite Extension: AROM, Left, 10 reps, Seated Neck Flexion: AROM, 5 reps Neck Extension: AROM, 5 reps Neck Lateral Flexion - Right: AROM, 5 reps Neck Lateral Flexion - Left: AROM, 5 reps   Shoulder Instructions Shoulder Instructions Donning/doffing shirt without moving shoulder:  Set-up Method for sponge bathing under operated UE: Set-up Donning/doffing sling/immobilizer: Set-up Correct positioning of sling/immobilizer: Set-up Pendulum exercises (written home exercise program): Supervision/safety ROM for elbow, wrist and digits of operated UE: Modified independent Sling wearing schedule (on at all times/off for ADL's): Modified independent Proper positioning of operated UE when showering: Supervision/safety Dressing change:  (Able to verbalize) Positioning of UE while sleeping: Modified independent    Home Living Family/patient expects to be discharged to:: Private residence Living Arrangements: Non-relatives/Friends Available Help at Discharge: Other (Comment) (Best friend Randall Hiss) Type of Home: House Home Access: Stairs to enter [employer] of Steps: 2-3 Entrance Stairs-Rails: Right Home Layout: One level     Bathroom Shower/Tub: Tub/shower unit         Home Equipment: Grab bars - tub/shower;Shower seat (Pt reports she has a shower seat, but does not use)          Prior Functioning/Environment Prior Level of Function : Independent/Modified Independent;Working/employed             Mobility Comments: Works at [employer] - no AD, denies falls since injury in 2022 but admits to feeling unsteady and furniture walking. Attributes to neuropathy and resports improves with footware use. ADLs Comments: ind        OT Problem List: Decreased strength;Decreased activity tolerance;Decreased range of motion;Decreased coordination;Decreased knowledge of use of DME or AE;Decreased knowledge of precautions;Impaired UE functional use      OT Treatment/Interventions: Self-care/ADL training;Therapeutic exercise;DME and/or AE instruction;Patient/family education;Balance training;Therapeutic activities    OT Goals(Current goals can be found in the care plan section) Acute Rehab OT Goals Patient  Stated Goal: go home OT Goal Formulation: With  patient Time For Goal Achievement: 01/29/23 Potential to Achieve Goals: Good  OT Frequency: Min 2X/week    Co-evaluation              AM-PAC OT "6 Clicks" Daily Activity     Outcome Measure Help from another person eating meals?: None Help from another person taking care of personal grooming?: A Little Help from another person toileting, which includes using toliet, bedpan, or urinal?: A Little Help from another person bathing (including washing, rinsing, drying)?: A Little Help from another person to put on and taking off regular upper body clothing?: A Little Help from another person to put on and taking off regular lower body clothing?: A Little 6 Click Score: 19   End of Session Equipment Utilized During Treatment: Other (comment) (sling) Nurse Communication: Mobility status  Activity Tolerance: Patient tolerated treatment well Patient left: in bed;with call bell/phone within reach  OT Visit Diagnosis: Unsteadiness on feet (R26.81);Muscle weakness (generalized) (M62.81);Pain Pain - Right/Left: Left Pain - part of body: Arm;Shoulder                Time: [id number] OT Time Calculation (min): 29 min Charges:  OT General Charges $OT Visit: 1 Visit OT Evaluation $OT Eval Low Complexity: 1 Low OT Treatments $Self Care/Home Management : 8-22 mins  Elder Cyphers, OTR/L [facility] Acute Rehabilitation Office: [phone]   Magnus Ivan 01/15/2023, 10:54 AM

## 2023-01-15 NOTE — Progress Notes (Signed)
Physical Therapy Treatment Patient Details Name: Caitlin Odonnell MRN: [id number] DOB: 07-11-1956 Today's Date: 01/15/2023   History of Present Illness 66 y.o. female admitted 3/15 s/p Repair of left humeral nonunion and Removal of hardware. PMH: hypertension, neuropathy, diabetes mellitus.    PT Comments    Pt greeted supine and agreeable to session with continued progress towards acute goals. Pt with good adherence to NWB during bed mobility with supervision assist for safety. Pt able to come to stand without AD with improved stability this date. Pt agreeable to cane use for gait with pt endorsing increased stability at supervision level for safety. Pt declining stair training. Pt was educated on continued cane use, especially out in community, to maximize functional independence, safety, and decrease risk for falls. Anticipate safe discharge, with assist level outlined below, once medically cleared, will continue to follow acutely.     Recommendations for follow up therapy are one component of a multi-disciplinary discharge planning process, led by the attending physician.  Recommendations may be updated based on patient status, additional functional criteria and insurance authorization.  Follow Up Recommendations  Outpatient PT (Once cleared by surgeon)     Assistance Recommended at Discharge Set up Supervision/Assistance  Patient can return home with the following A little help with bathing/dressing/bathroom;Assistance with cooking/housework;Assist for transportation;Help with stairs or ramp for entrance   Equipment Recommendations  Cane    Recommendations for Other Services       Precautions / Restrictions Precautions Precautions: Shoulder Shoulder Interventions: Shoulder sling/immobilizer Restrictions Weight Bearing Restrictions: Yes LUE Weight Bearing: Non weight bearing     Mobility  Bed Mobility Overal bed mobility: Modified Independent             General bed  mobility comments: no assist, extra time, maintains precautions    Transfers Overall transfer level: Needs assistance Equipment used: None Transfers: Sit to/from Stand Sit to Stand: Supervision           General transfer comment: Supervision for safety, maintains NWB LUE during transfer. taking cane after coming to stand    Ambulation/Gait Ambulation/Gait assistance: Supervision Gait Distance (Feet): 128 Feet Assistive device: Straight cane Gait Pattern/deviations: Step-through pattern, Wide base of support, Drifts right/left Gait velocity: decr     General Gait Details: pt endorsing increased stability with cane use, encouraged pt continued use for community ambualtion, general mild instability noted , no overt LOB, distance limited by fatigue   Stairs             Wheelchair Mobility    Modified Rankin (Stroke Patients Only)       Balance Overall balance assessment: Mild deficits observed, not formally tested                                          Cognition Arousal/Alertness: Awake/alert Behavior During Therapy: WFL for tasks assessed/performed Overall Cognitive Status: Within Functional Limits for tasks assessed                                          Exercises      General Comments        Pertinent Vitals/Pain Pain Assessment Pain Assessment: No/denies pain    Home Living  Prior Function            PT Goals (current goals can now be found in the care plan section) Acute Rehab PT Goals PT Goal Formulation: With patient Time For Goal Achievement: 01/21/23 Progress towards PT goals: Progressing toward goals    Frequency    Min 3X/week      PT Plan      Co-evaluation              AM-PAC PT "6 Clicks" Mobility   Outcome Measure  Help needed turning from your back to your side while in a flat bed without using bedrails?: None Help needed moving from  lying on your back to sitting on the side of a flat bed without using bedrails?: None Help needed moving to and from a bed to a chair (including a wheelchair)?: A Little Help needed standing up from a chair using your arms (e.g., wheelchair or bedside chair)?: A Little Help needed to walk in hospital room?: A Little Help needed climbing 3-5 steps with a railing? : A Little 6 Click Score: 20    End of Session   Activity Tolerance: Patient tolerated treatment well Patient left: with call bell/phone within reach;in bed;with family/visitor present (seated EOB) Nurse Communication: Mobility status PT Visit Diagnosis: Unsteadiness on feet (R26.81);Other abnormalities of gait and mobility (R26.89)     Time: [id number] PT Time Calculation (min) (ACUTE ONLY): 13 min  Charges:  $Gait Training: 8-22 mins                     Novalee Horsfall R. PTA Acute Rehabilitation Services Office: [place] 01/15/2023, 10:05 AM

## 2023-01-15 NOTE — TOC Transition Note (Signed)
Transition of Care [facility]) - CM/SW Discharge Note   Patient Details  Name: Caitlin Odonnell MRN: [id number] Date of Birth: 1956-03-10  Transition of Care [facility]) CM/SW Contact:  Maebelle Munroe, RN Phone Number: 01/15/2023, 3:32 PM   Clinical Narrative:  [facility] team for discharge planning. Patient is alert and verbally responsive. Spoke to her via phone. Discussed discharge plan and recommendation for Cane. She agrees to cane being drop shipped to her home and that she will have assistance to navigates the steps into the house. No further needs noted.      Final next level of care: Home/Self Care Barriers to Discharge: No Barriers Identified   Patient Goals and CMS Choice      Discharge Placement                         Discharge Plan and Services Additional resources added to the After Visit Summary for                  DME Arranged: Kasandra Knudsen DME Agency: [employer] Date DME Agency Contacted: 01/15/23 Time DME Agency Contacted: [id number] Representative spoke with at DME Agency: Delana [phone]            Social Determinants of Health ([place]) Interventions SDOH Screenings   Tobacco Use: Low Risk  (01/14/2023)     Readmission Risk Interventions     No data to display

## 2023-01-17 LAB — HEMOGLOBIN A1C
Hgb A1c MFr Bld: 6.4 % — ABNORMAL HIGH (ref 4.8–5.6)
Mean Plasma Glucose: 137 mg/dL

## 2023-01-17 NOTE — Anesthesia Postprocedure Evaluation (Signed)
Anesthesia Post Note  Patient: Caitlin Odonnell  Procedure(s) Performed: REPAIR OF HUMERAL SHAFT NONUNION (Left: Arm Upper) HARDWARE REMOVAL (Left: Arm Upper) HARVEST ILIAC BONE GRAFT (Left: Hip)     Patient location during evaluation: PACU Anesthesia Type: General Level of consciousness: sedated and patient cooperative Pain management: pain level controlled Vital Signs Assessment: post-procedure vital signs reviewed and stable Respiratory status: spontaneous breathing Cardiovascular status: stable Anesthetic complications: no   No notable events documented.  Last Vitals:  Vitals:   01/15/23 0720 01/15/23 0821  BP:  110/61  Pulse:  83  Resp:  18  Temp:  36.7 C  SpO2: 97% 94%    Last Pain:  Vitals:   01/15/23 0901  TempSrc:   PainSc: 0-No pain                 Nolon Nations

## 2023-01-18 NOTE — Discharge Summary (Signed)
Discharge Summary  Patient ID: Caitlin Odonnell MRN: [id number] DOB/AGE: 04/15/1956 67 y.o.  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    She was given perioperative antibiotics:  Anti-infectives (From admission, onward)    Start     Dose/Rate Route Frequency Ordered Stop   01/14/23 1400  ceFAZolin (ANCEF) IVPB 2g/100 mL premix        2 g 200 mL/hr over 30 Minutes Intravenous Every 8 hours 01/14/23 1215 01/15/23 0606   01/14/23 0800  vancomycin (VANCOCIN) powder  Status:  Discontinued          As needed 01/14/23 0817 01/14/23 1022   01/14/23 0600  ceFAZolin (ANCEF) IVPB 3g/100 mL premix        3 g 200 mL/hr over 30 Minutes Intravenous On call to O.R. 01/14/23 0556 01/14/23 0800     .  She was given sequential compression devices, early ambulation, and aspirin for DVT prophylaxis.  She benefited maximally from the hospital stay and there were no complications.    Recent vital signs:  Vitals:   01/15/23 0720 01/15/23 0821  BP:  110/61  Pulse:  83  Resp:  18  Temp:  98 F (36.7 C)  SpO2: 97% 94%    Recent laboratory  studies:  Lab Results  Component Value Date   HGB 11.9 (L) 01/15/2023   HGB 13.7 01/14/2023   HGB 13.7 12/28/2021   Lab Results  Component Value Date   WBC 8.9 01/15/2023   PLT 309 01/15/2023   No results found for: "INR" Lab Results  Component Value Date   NA 133 (L) 01/15/2023   K 4.2 01/15/2023   CL 101 01/15/2023   CO2 23 01/15/2023   BUN 23 01/15/2023   CREATININE 0.89 01/15/2023   GLUCOSE 125 (H) 01/15/2023    Discharge Medications:   Allergies as of 01/15/2023       Reactions   Cortisone Other (See Comments)   Had some numbness in her face. States it happened after a cortisone injection in her knee, had never had reaction to it prior to this incident.    Food Other (See Comments)   Tomato-itching        Medication List     STOP taking these medications    ibuprofen 200 MG tablet Commonly known as: ADVIL   traMADol 50 MG tablet Commonly known as: ULTRAM       TAKE these medications    acetaminophen 325 MG tablet Commonly known as: TYLENOL Take 1-2 tablets (325-650 mg total) by mouth every 6 (six) hours as needed for mild pain, fever or headache (pain score 1-3 or temp >  100.5).   amLODipine 5 MG tablet Commonly known as: NORVASC Take 5 mg by mouth in the morning.   AQUAPHOR EX Apply 1 Application topically in the morning and at bedtime. Mixed with clobetasol and mupirocin   aspirin EC 81 MG tablet Take 1 tablet (81 mg total) by mouth daily. Swallow whole.   CALCIUM-D PO Take 1 tablet by mouth daily.   clobetasol cream 0.05 % Commonly known as: TEMOVATE Apply 1 Application topically 2 (two) times daily. Mixed with mupirocin and aquaphor   furosemide 20 MG tablet Commonly known as: LASIX Take 10 mg by mouth daily.   HYDROcodone-acetaminophen 5-325 MG tablet Commonly known as: NORCO/VICODIN Take 1-2 tablets by mouth every 4 (four) hours as needed for severe pain.   hydrOXYzine 25 MG tablet Commonly known as: ATARAX Take 25 mg by  mouth at bedtime.   lisinopril-hydrochlorothiazide 20-25 MG tablet Commonly known as: ZESTORETIC Take 1 tablet by mouth at bedtime.   magnesium gluconate 500 MG tablet Commonly known as: MAGONATE Take 500 mg by mouth at bedtime.   Melatonin 10 MG Caps Take 10 mg by mouth at bedtime.   meloxicam 15 MG tablet Commonly known as: MOBIC Take 15 mg by mouth every evening.   metFORMIN 1000 MG tablet Commonly known as: GLUCOPHAGE Take 1,000 mg by mouth daily with breakfast.   methocarbamol 500 MG tablet Commonly known as: ROBAXIN Take 1 tablet (500 mg total) by mouth every 6 (six) hours as needed for muscle spasms.   mupirocin ointment 2 % Commonly known as: BACTROBAN Apply 1 Application topically 2 (two) times daily. Mixed with clobetasol and aquaphor   omeprazole 20 MG capsule Commonly known as: PRILOSEC Take 20 mg by mouth daily.   pregabalin 50 MG capsule Commonly known as: LYRICA Take 50 mg by mouth at bedtime.   simvastatin 5 MG tablet Commonly known as: ZOCOR Take 5 mg by mouth at bedtime.   TURMERIC PO Take 2 tablets by mouth daily.        Diagnostic Studies: DG Humerus Left  Result Date: 01/14/2023 CLINICAL DATA:  Postoperative. EXAM: LEFT HUMERUS - 2+ VIEW COMPARISON:  Intraoperative fluoroscopy left humerus 01/14/2023, left shoulder radiographs 11/17/2022, CT left humerus 12/22/2022 FINDINGS: Interval removal of the prior lateral plate and screw fixation hardware, placement of two screws traversing the oblique nonunited fracture of the left humeral midshaft, and placement of two new sets of long plates and screws fixating the fracture. Near anatomic alignment with unchanged chronic bone hypertrophy/spurring of the proximal fracture component extending posteriorly. IMPRESSION: Interval removal of the prior lateral plate and screw fixation hardware, placement of two screws traversing the oblique nonunited fracture of the left humeral midshaft, and placement of two  new sets of long plates and screws fixate the fracture. No evidence of hardware failure. Electronically Signed   By: Yvonne Kendall M.D.   On: 01/14/2023 11:03   DG Humerus Left  Result Date: 01/14/2023 CLINICAL DATA:  Fluoroscopic guidance for repair of humeral shaft nonunion. EXAM: LEFT HUMERUS - 2+ VIEW COMPARISON:  CT examination dated December 22, 2018 FINDINGS: Multiple intraoperative fluoroscopic images for repair of humeral shaft nonunion. Total fluoroscopic time was 51 seconds and fluoroscopic dose was 3.43 mGy. IMPRESSION: Multiple intraoperative fluoroscopic images for repair of humeral shaft nonunion. Electronically Signed   By: Keane Police D.O.   On: 01/14/2023 10:08   DG C-Arm 1-60 Min-No Report  Result Date: 01/14/2023 Fluoroscopy was utilized by the requesting physician.  No radiographic interpretation.  DG C-Arm 1-60 Min-No Report  Result Date: 01/14/2023 Fluoroscopy was utilized by the requesting physician.  No radiographic interpretation.   CT HUMERUS LEFT WO CONTRAST  Result Date: 12/25/2022 CLINICAL DATA:  Status post ORIF left humerus EXAM: CT OF THE UPPER LEFT EXTREMITY WITHOUT CONTRAST TECHNIQUE: Multidetector CT imaging of the upper left extremity was performed according to the standard protocol. RADIATION DOSE REDUCTION: This exam was performed according to the departmental dose-optimization program which includes automated exposure control, adjustment of the mA and/or kV according to patient size and/or use of iterative reconstruction technique. COMPARISON:  Multiple prior left humerus radiographs, most recently 11/17/2022 FINDINGS: Bones/Joint/Cartilage Mid humeral diaphyseal fracture status post lateral plate and screw fixation. Hardware is intact without evidence of loosening. There is bulky mature callus formation along the posterolateral aspect of the fracture, but there is a persistent fracture cleft extending through the width of the bone in this is not definitively  bridging (series 5, images 182 and 194). There was previous fracture extension through the base of the greater tuberosity. This component appears to be well healed. 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 Old left upper rib injuries noted. Ligaments Suboptimally assessed by CT. Muscles and Tendons No acute myotendinous abnormality by CT. Soft tissues No focal fluid collection. There are multiple pulmonary nodules incidentally noted in the partially visualized left lung. For reference: -Solid 7 x 6 mm (average 6.5 mm) nodule on series 5, image 100. -Solid 6 x 5 mm (average 5.5 mm) nodule on series 5, image 103. -Solid 4 x 4 mm (average 4 mm) nodule on series 5, image 30. IMPRESSION: Persistent fracture cleft with mature callus formation which is not definitively bridging across the mid humeral diaphyseal fracture. Plate fixation hardware is intact without evidence of loosening. Multiple incidental pulmonary nodules noted in the partially visualized left lung, largest measuring up to 6.5 mm. Recommend noncontrast chest CT for further evaluation. These results will be called to the ordering clinician or representative by the Radiologist Assistant, and communication documented in the PACS or [employer]. Electronically Signed   By: Maurine Simmering M.D.   On: 12/25/2022 09:07    Disposition: Discharge disposition: 01-Home or Self Care          Follow-up Information     Haddix, Thomasene Lot, MD. Schedule an appointment as soon as possible for a visit in 2 week(s).   Specialty: Orthopedic Surgery Why: for wound check and repeat x-rays Contact information: [place] [phone]                  Signed: Ventura Bruns PA-C 01/18/2023, 7:11 AM

## 2023-01-19 LAB — AEROBIC/ANAEROBIC CULTURE W GRAM STAIN (SURGICAL/DEEP WOUND)
Culture: NO GROWTH
Culture: NO GROWTH
Gram Stain: NONE SEEN

## 2023-01-21 ENCOUNTER — Encounter (HOSPITAL_COMMUNITY): Payer: Self-pay | Admitting: Student

## 2023-04-09 IMAGING — DX DG HUMERUS 2V *L*
2 series · 2 of 2 positions shown · non-contrast
Comparison: Multiple x-rays since July 25, 2021

CLINICAL DATA: Follow-up fracture.

EXAM:
LEFT HUMERUS - 2+ VIEW

[humerus ap]
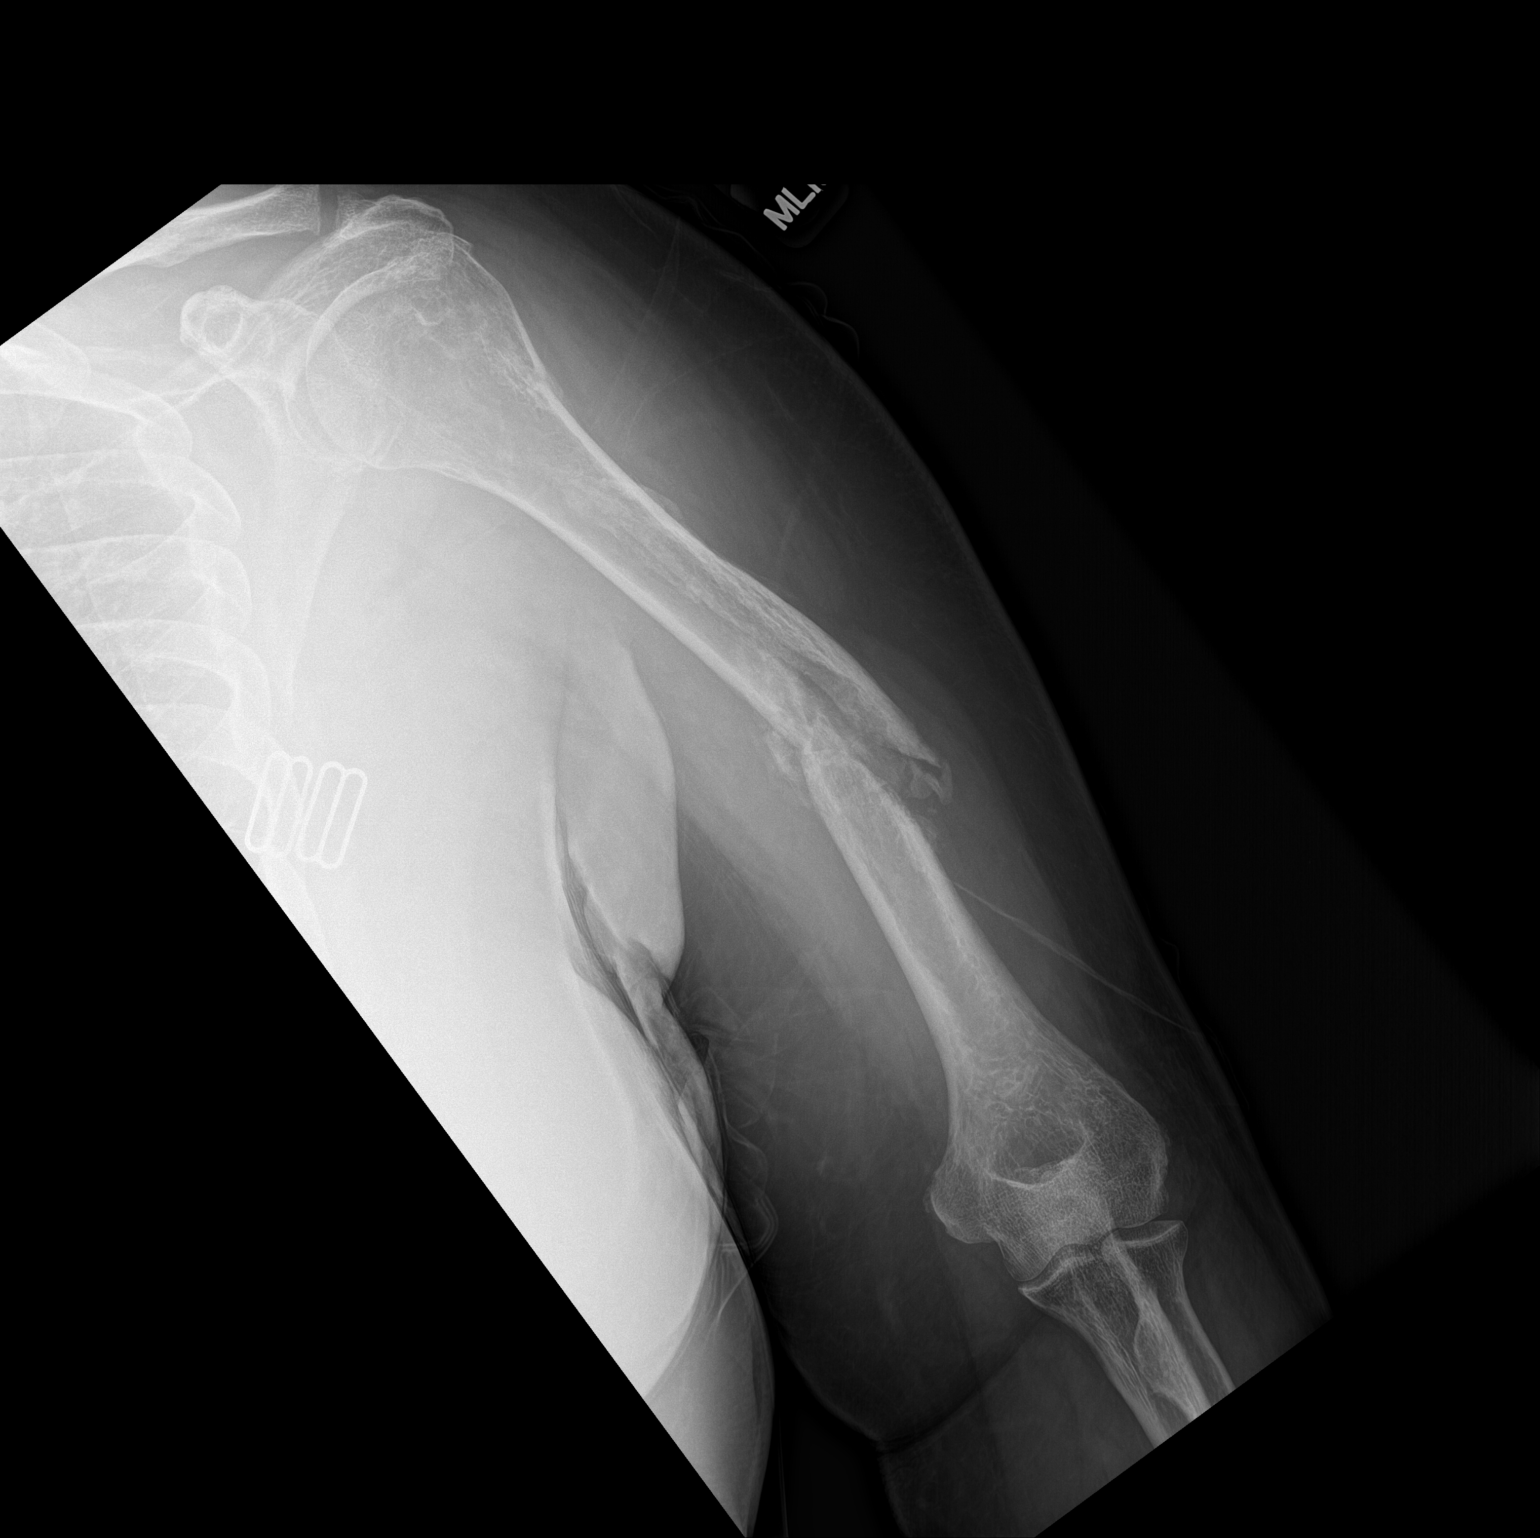

[humerus lat]
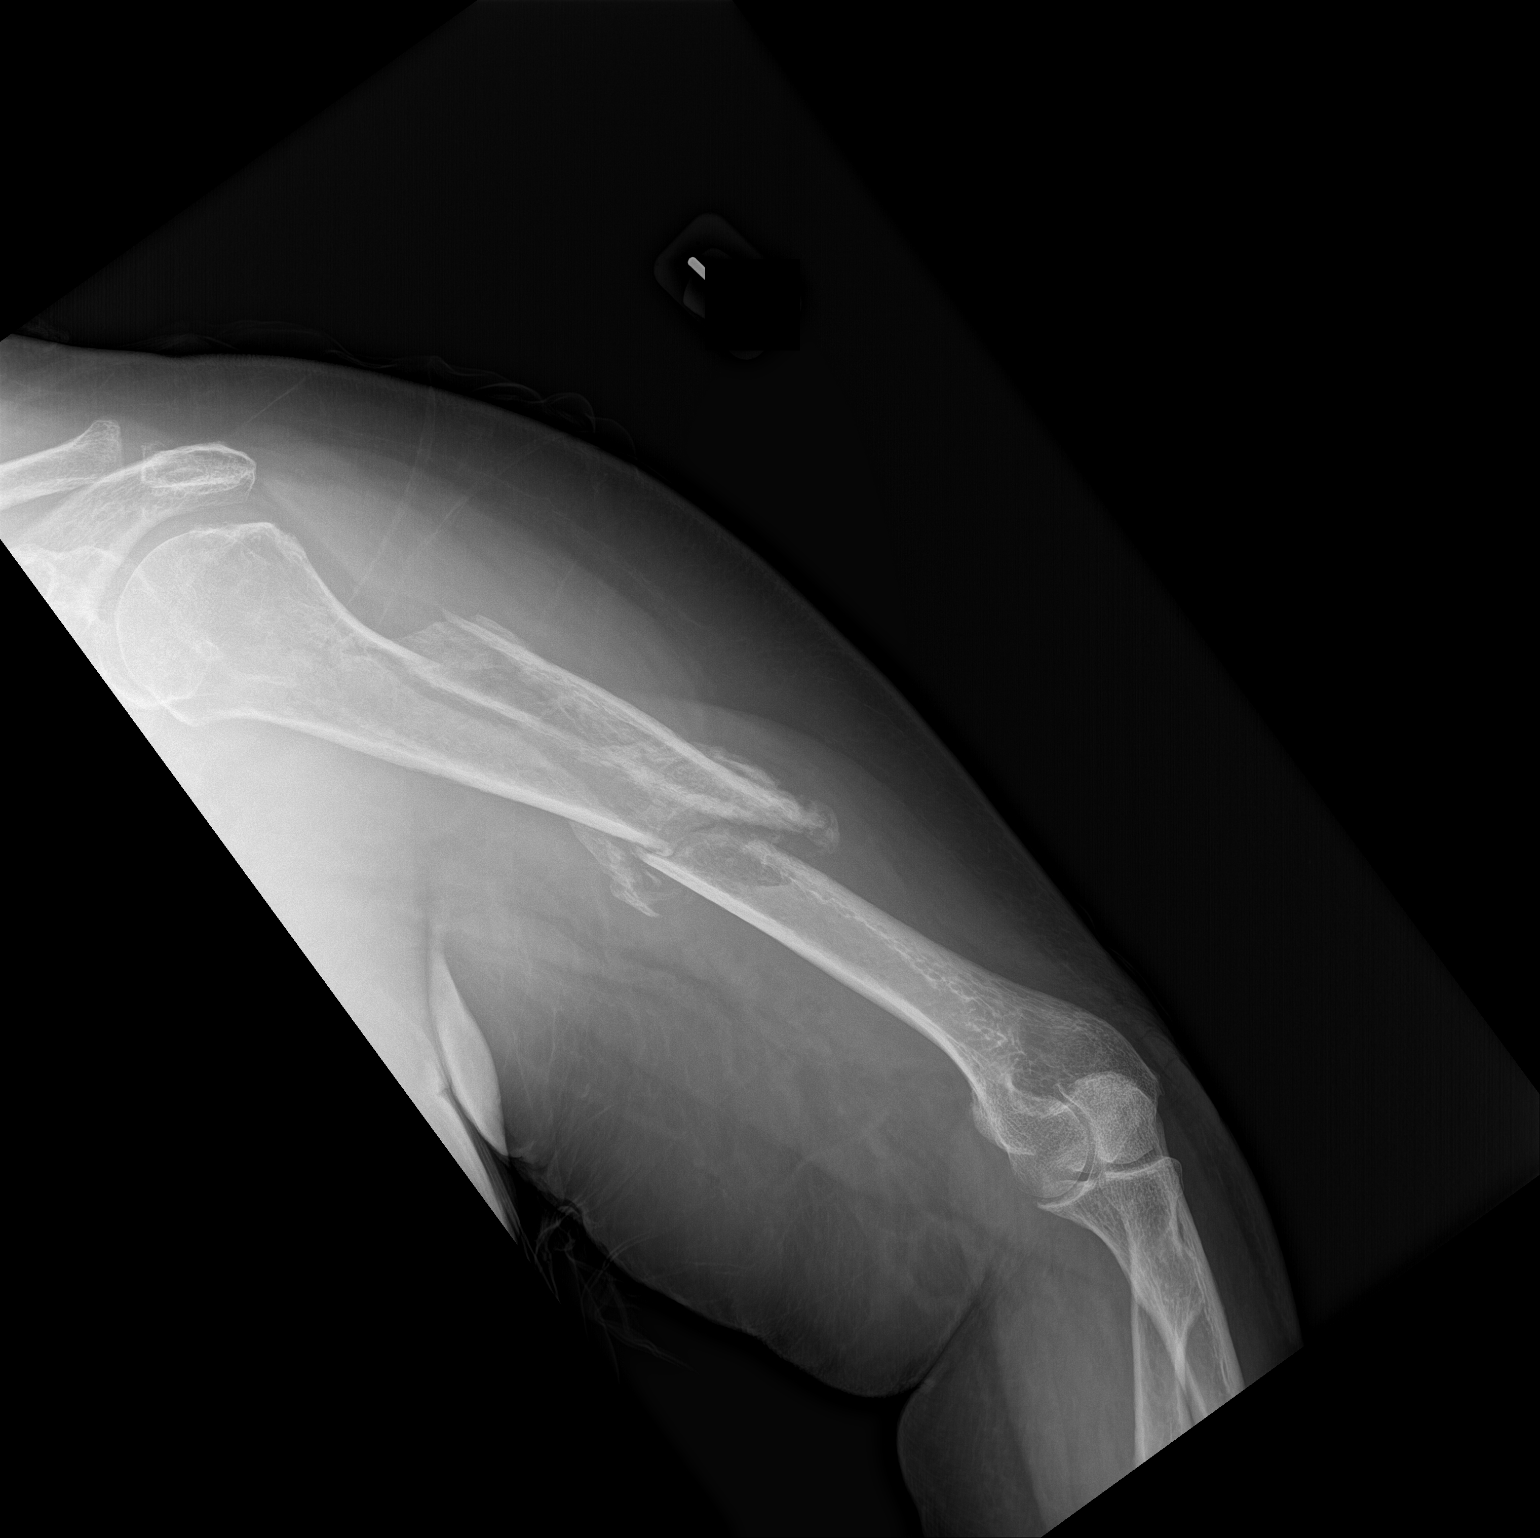

[2 of 2 positions shown; findings below may reference images not displayed]

FINDINGS: The angulated displaced mid humeral diaphysis fracture demonstrates
similar alignment in the interval. Callus formation is not
significantly changed without solid bony bridging. No other changes.
IMPRESSION: Stable healing mid shaft humeral fracture with peripheral callus.
However, solid bony bridging is not visualized.

## 2023-06-11 IMAGING — DX DG HUMERUS 2V *L*
2 series · 2 of 2 positions shown · non-contrast
Comparison: Most recent radiograph 10/19/2021, additional priors
reviewed.

CLINICAL DATA: Followup closed displaced comminuted fracture, shaft
of left humerus.

EXAM:
LEFT HUMERUS - 2+ VIEW

[humerus ap]
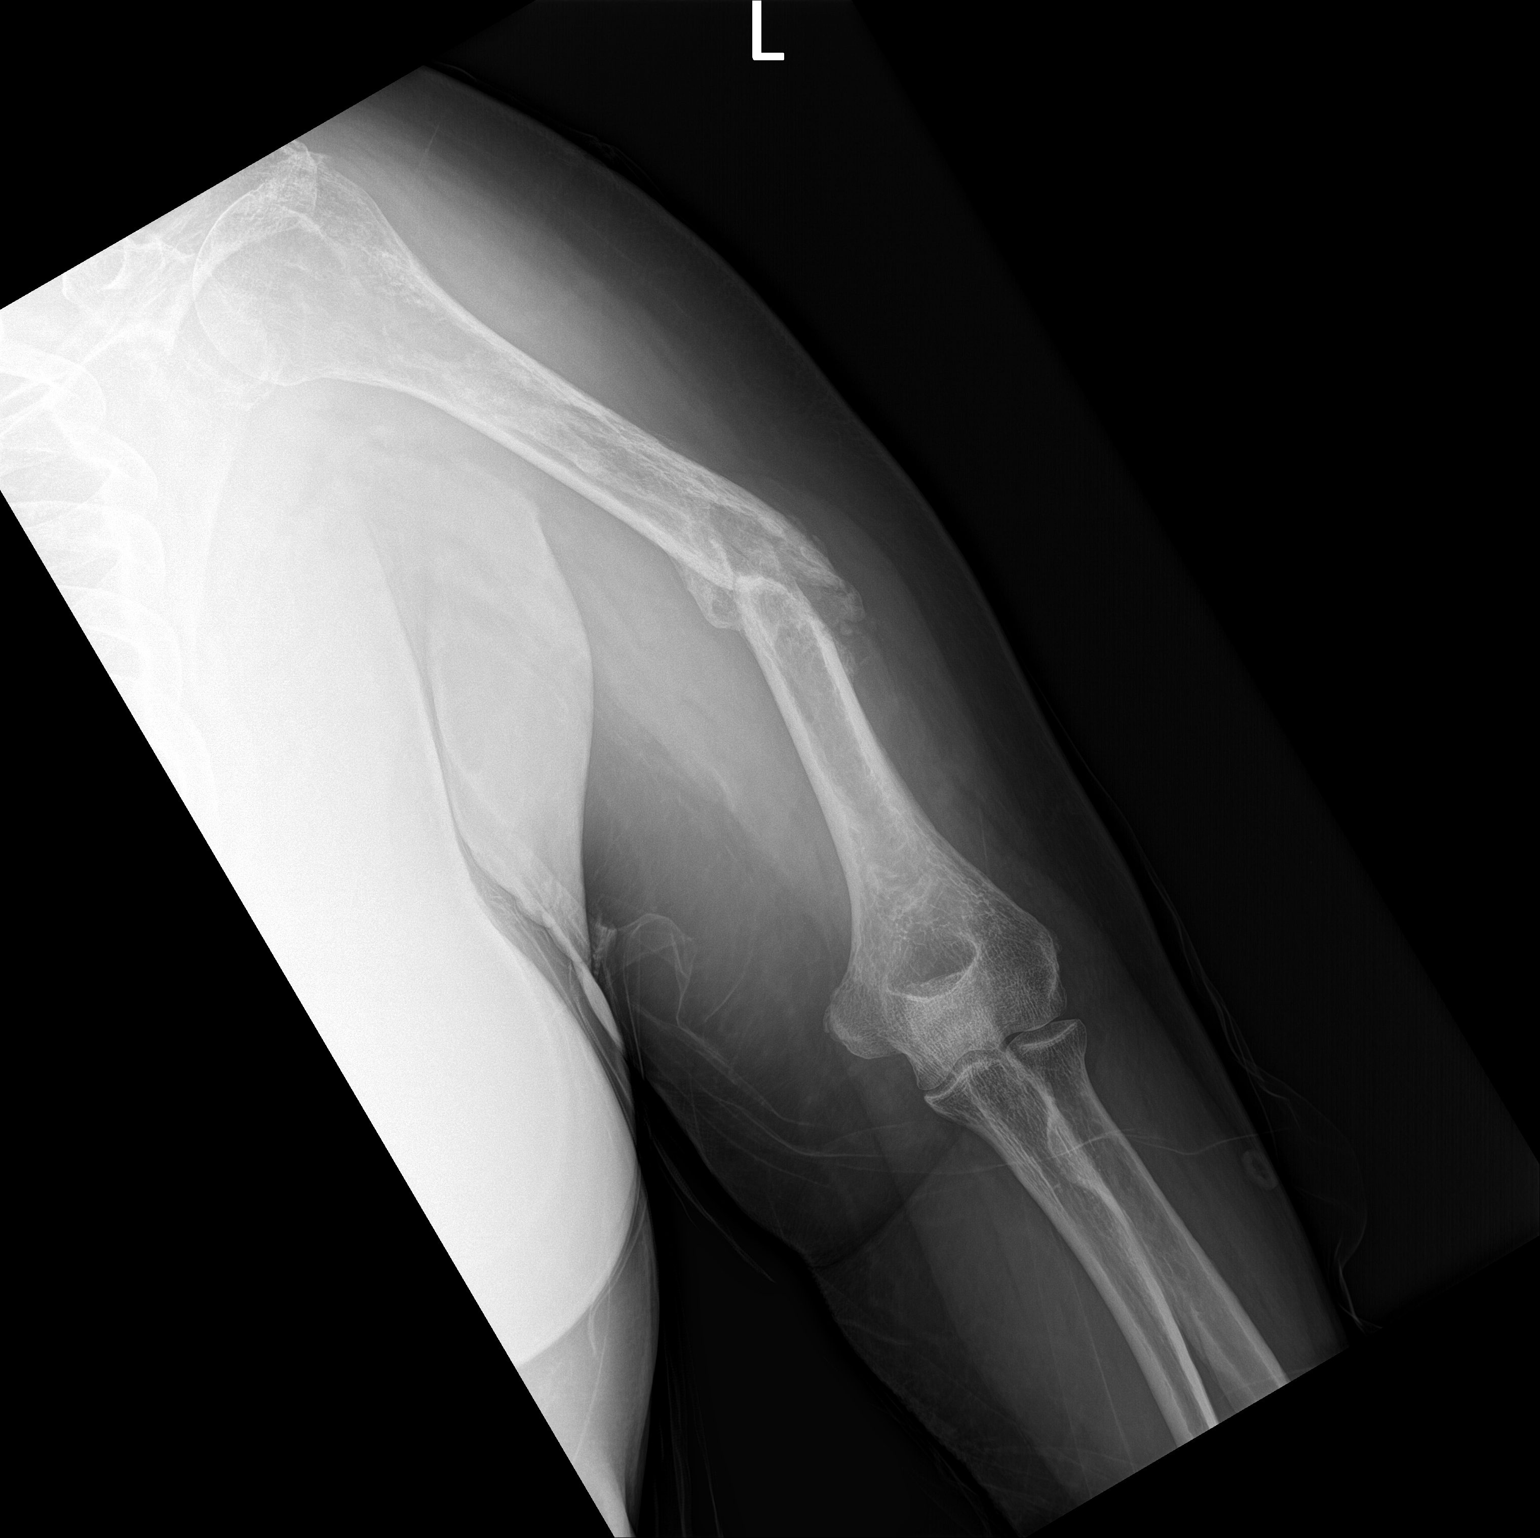

[humerus lat]
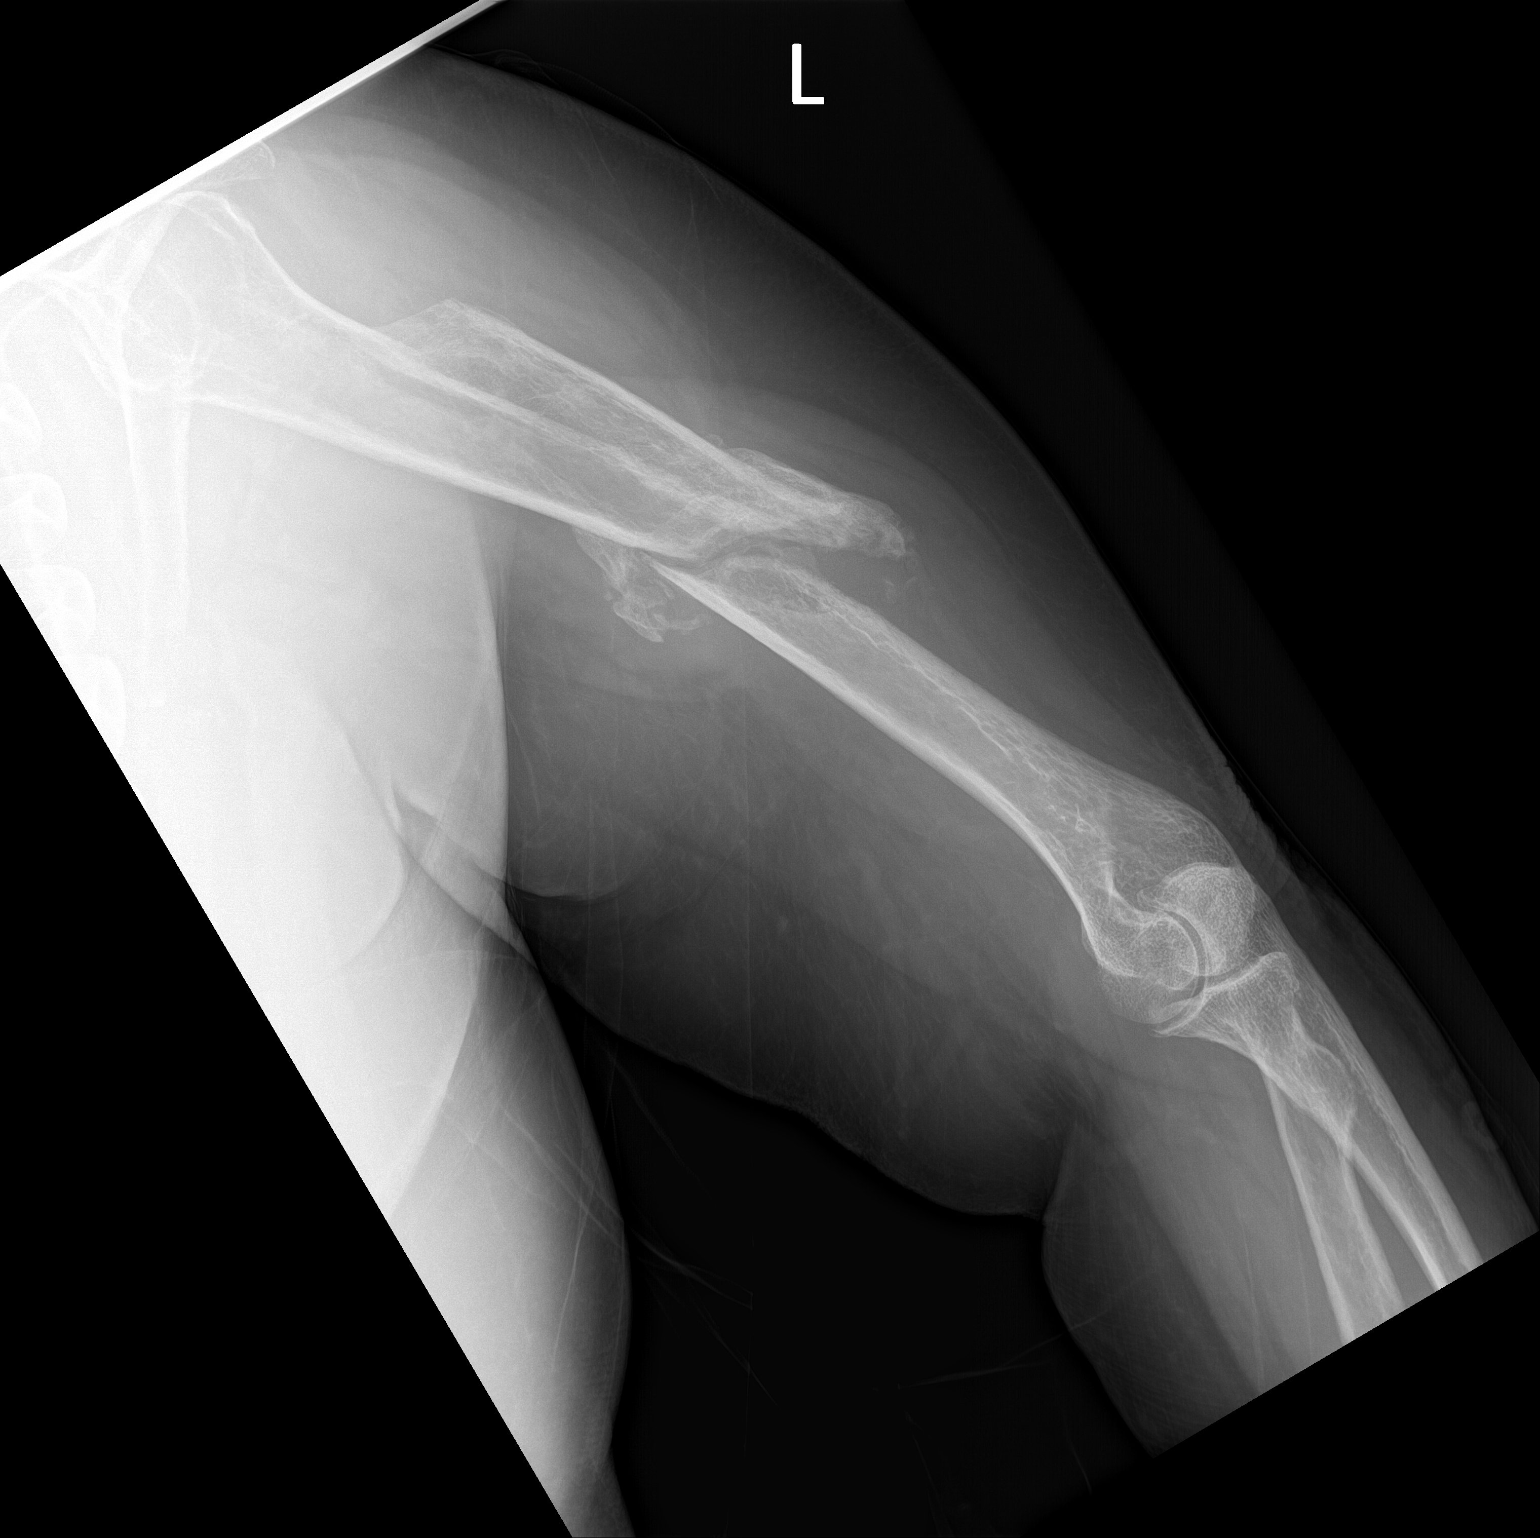

[2 of 2 positions shown; findings below may reference images not displayed]

FINDINGS: Midshaft humeral fracture with slight increased in apex lateral
angulation from prior exam. There is interval peripheral callus
formation and bony bridging from prior exam, this remains
incomplete. Dominant fracture line remains visible. The exam is
otherwise unchanged.
IMPRESSION: Slight increased in angulation of mid shaft humeral fracture from
prior exam. There is interval callus formation with bony bridging
since prior exam, however incomplete. Fracture line remains visible.

## 2023-06-18 IMAGING — RF DG HUMERUS 2V *L*
1 series · 15 of 21 positions shown · non-contrast
Comparison: None.

CLINICAL DATA: ORIF of the left humerus.

EXAM:
LEFT HUMERUS - 2 VIEW

[Series 1: run · 19 acquisitions, 15 frames shown]
[im 1/19]
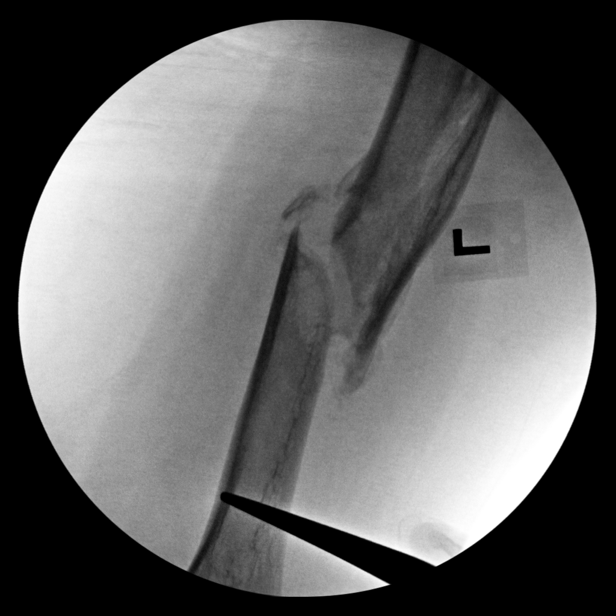
[im 3/19]
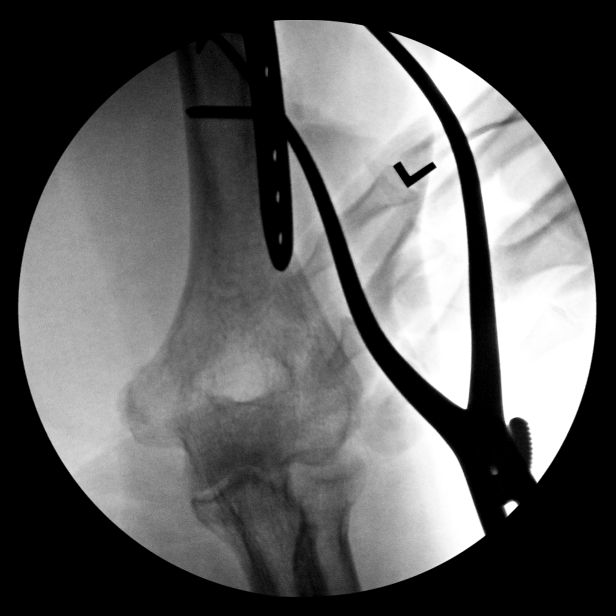
[im 4/19]
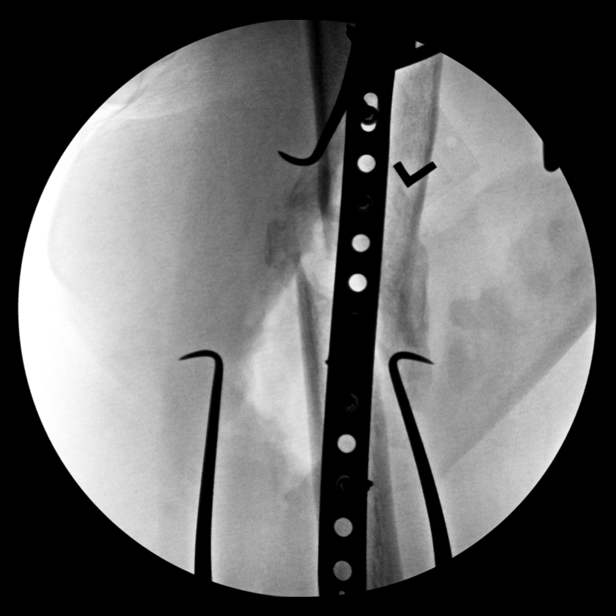
[im 5/19]
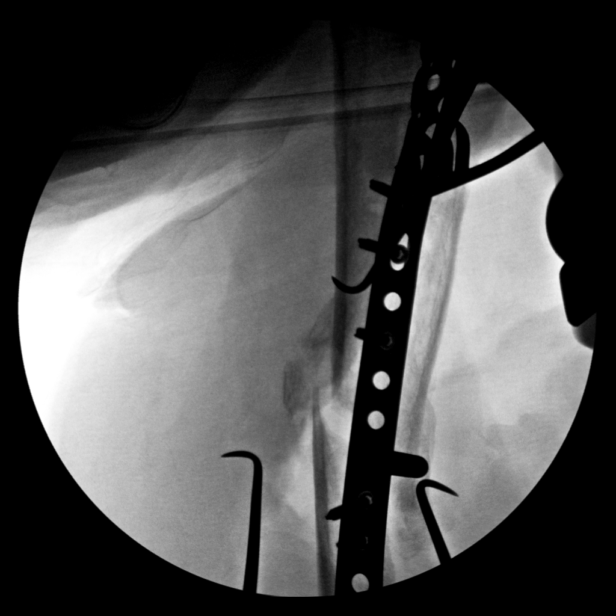
[im 7/19]
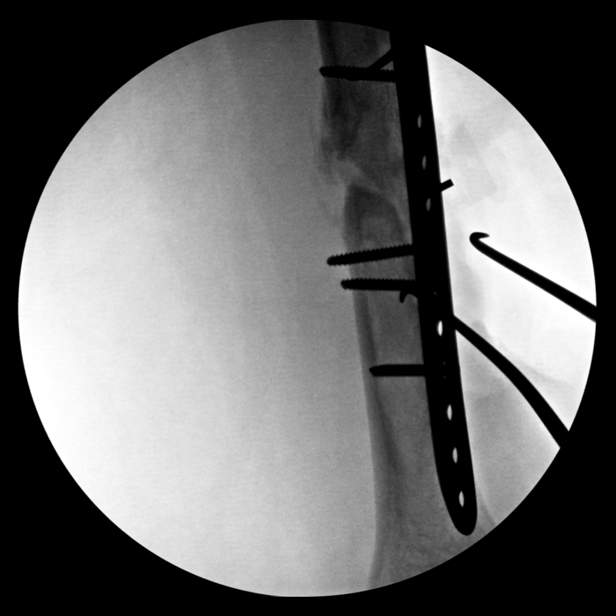
[im 8/19]
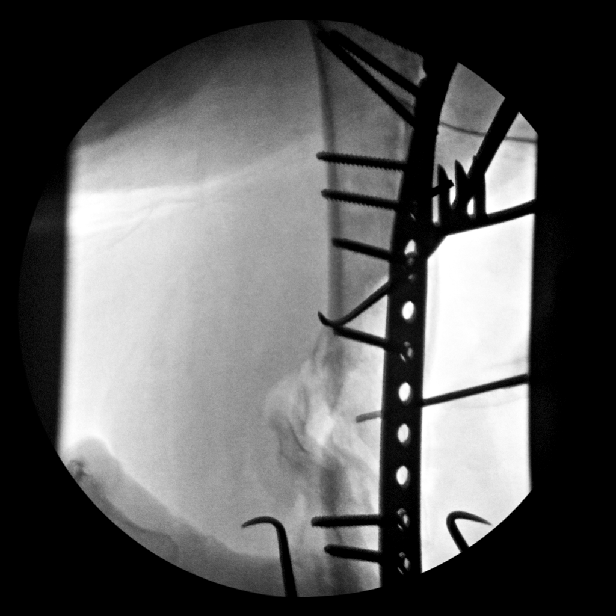
[im 10/19]
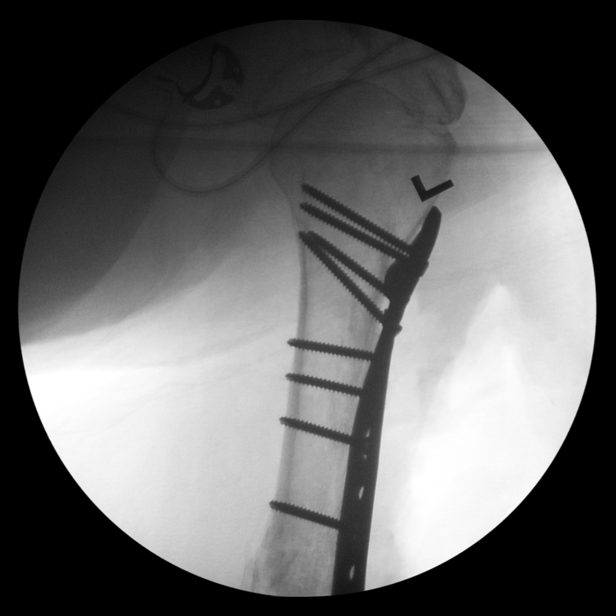
[im 11/19]
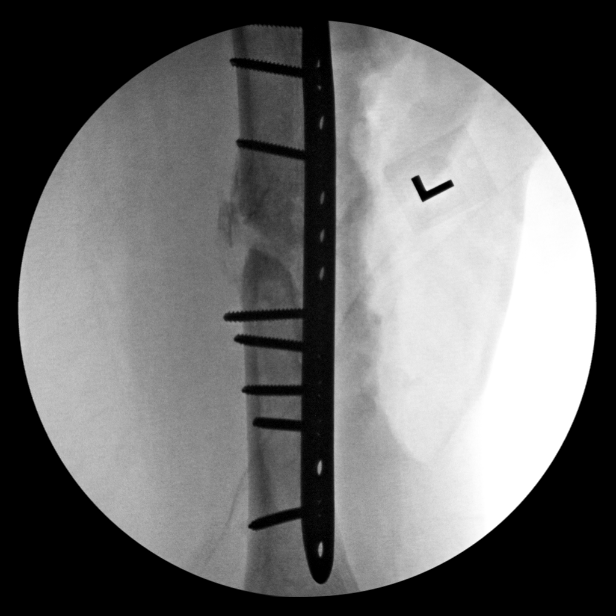
[im 12/19]
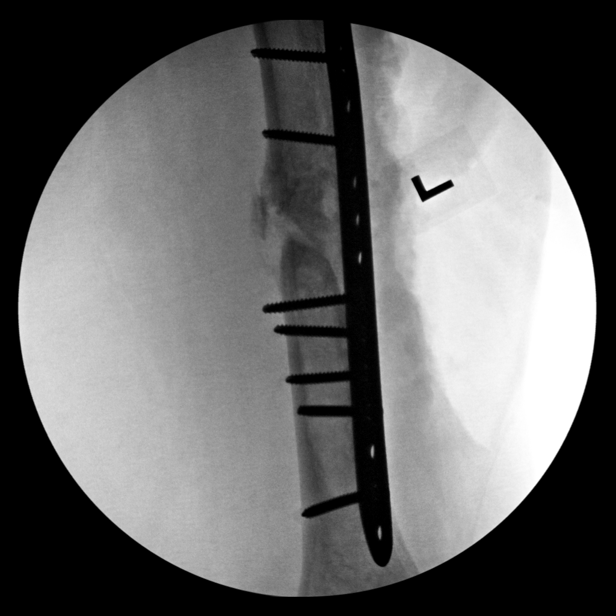
[im 14/19]
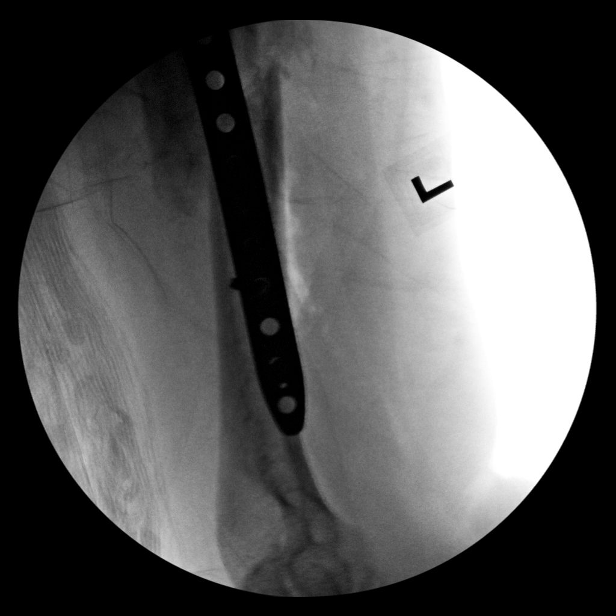
[im 15/19]
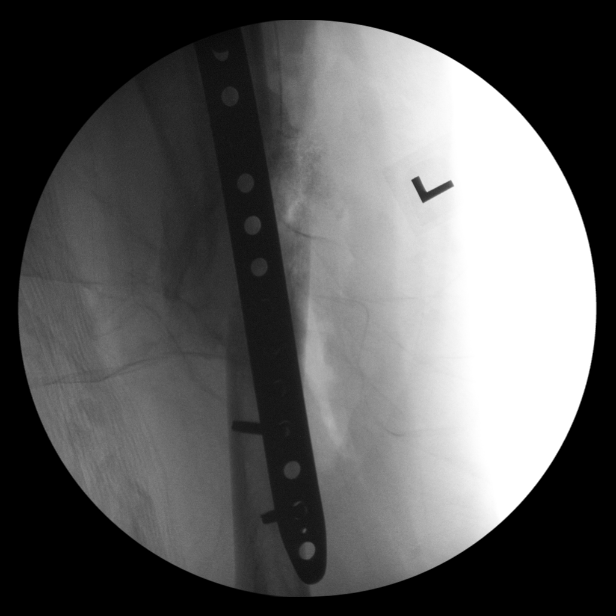
[im 16/19]
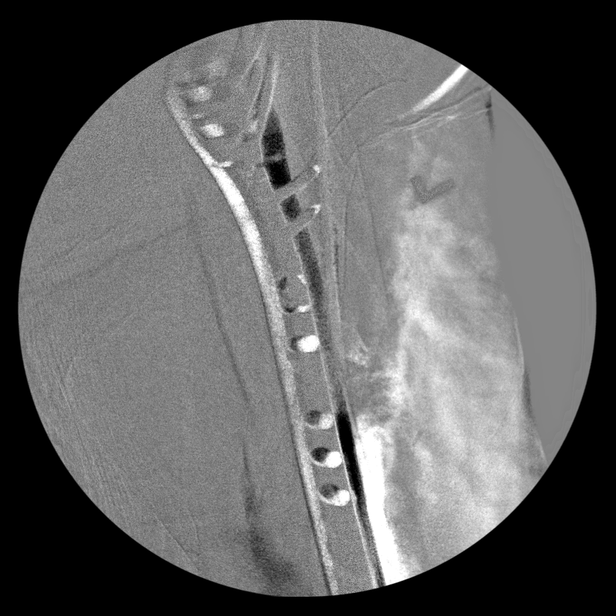
[im 16/19]
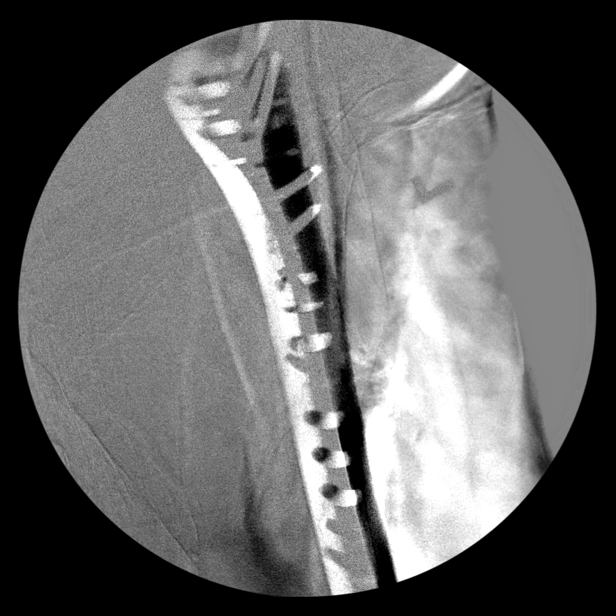
[im 17/19]
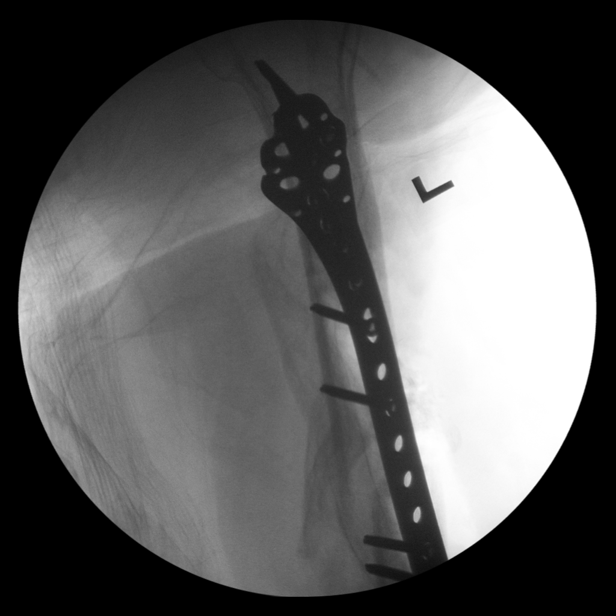
[im 19/19]
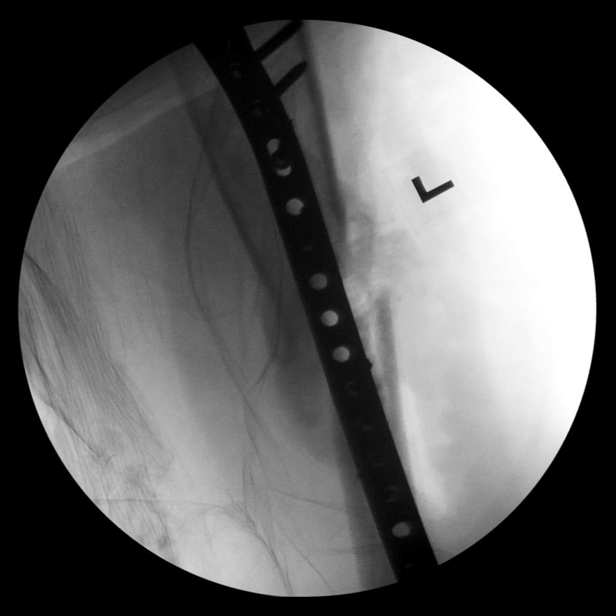

[15 of 21 positions shown; findings below may reference images not displayed]

FINDINGS: Fluoroscopic images were obtained intraoperatively and submitted for
post operative interpretation. Multiple images demonstrate ORIF of
the left humerus with hardware in expected position, 19 images were
obtained with 225.3 seconds of fluoroscopy time and 22.77 mGy.
Please see the performing provider's procedural report for further
detail.
IMPRESSION: As above.
